# Patient Record
Sex: Female | Born: 1950 | Race: White | Hispanic: No | Marital: Single | Smoking: Former smoker
Health system: Southern US, Community
[De-identification: ages and names within clinical notes are randomized; demographics above are authoritative.]

## PROBLEM LIST (undated history)

## (undated) DIAGNOSIS — E039 Hypothyroidism, unspecified: Secondary | ICD-10-CM

## (undated) DIAGNOSIS — F418 Other specified anxiety disorders: Secondary | ICD-10-CM

## (undated) DIAGNOSIS — E7800 Pure hypercholesterolemia, unspecified: Secondary | ICD-10-CM

## (undated) DIAGNOSIS — M25512 Pain in left shoulder: Secondary | ICD-10-CM

## (undated) DIAGNOSIS — M1712 Unilateral primary osteoarthritis, left knee: Secondary | ICD-10-CM

## (undated) DIAGNOSIS — K219 Gastro-esophageal reflux disease without esophagitis: Secondary | ICD-10-CM

## (undated) DIAGNOSIS — K52832 Lymphocytic colitis: Secondary | ICD-10-CM

## (undated) DIAGNOSIS — F419 Anxiety disorder, unspecified: Secondary | ICD-10-CM

## (undated) DIAGNOSIS — F329 Major depressive disorder, single episode, unspecified: Secondary | ICD-10-CM

## (undated) DIAGNOSIS — G4700 Insomnia, unspecified: Secondary | ICD-10-CM

## (undated) DIAGNOSIS — I10 Essential (primary) hypertension: Secondary | ICD-10-CM

## (undated) HISTORY — PX: JOINT REPLACEMENT: SHX530

## (undated) HISTORY — DX: Unilateral primary osteoarthritis, left knee: M17.12

## (undated) HISTORY — PX: COLONOSCOPY: SHX174

## (undated) HISTORY — DX: Other specified anxiety disorders: F41.8

## (undated) HISTORY — DX: Insomnia, unspecified: G47.00

## (undated) HISTORY — PX: EYE SURGERY: SHX253

## (undated) HISTORY — DX: Pure hypercholesterolemia, unspecified: E78.00

## (undated) HISTORY — PX: BACK SURGERY: SHX140

## (undated) HISTORY — DX: Lymphocytic colitis: K52.832

## (undated) HISTORY — DX: Hypothyroidism, unspecified: E03.9

---

## 1992-03-21 HISTORY — PX: OTHER SURGICAL HISTORY: SHX169

## 1995-03-22 HISTORY — PX: OTHER SURGICAL HISTORY: SHX169

## 1997-09-16 ENCOUNTER — Encounter: Admission: RE | Admit: 1997-09-16 | Discharge: 1997-12-15 | Payer: Self-pay | Admitting: Family Medicine

## 1998-07-21 ENCOUNTER — Other Ambulatory Visit: Admission: RE | Admit: 1998-07-21 | Discharge: 1998-07-21 | Payer: Self-pay | Admitting: Family Medicine

## 1999-10-06 ENCOUNTER — Other Ambulatory Visit: Admission: RE | Admit: 1999-10-06 | Discharge: 1999-10-06 | Payer: Self-pay | Admitting: Family Medicine

## 2000-09-05 ENCOUNTER — Encounter: Payer: Self-pay | Admitting: Family Medicine

## 2000-09-05 ENCOUNTER — Ambulatory Visit (HOSPITAL_COMMUNITY): Admission: RE | Admit: 2000-09-05 | Discharge: 2000-09-05 | Payer: Self-pay | Admitting: Family Medicine

## 2000-09-11 ENCOUNTER — Encounter: Payer: Self-pay | Admitting: Neurosurgery

## 2000-09-11 ENCOUNTER — Ambulatory Visit (HOSPITAL_COMMUNITY): Admission: RE | Admit: 2000-09-11 | Discharge: 2000-09-12 | Payer: Self-pay | Admitting: Neurosurgery

## 2000-10-04 ENCOUNTER — Encounter: Admission: RE | Admit: 2000-10-04 | Discharge: 2000-10-04 | Payer: Self-pay | Admitting: Neurosurgery

## 2000-10-04 ENCOUNTER — Encounter: Payer: Self-pay | Admitting: Neurosurgery

## 2001-11-15 ENCOUNTER — Other Ambulatory Visit: Admission: RE | Admit: 2001-11-15 | Discharge: 2001-11-15 | Payer: Self-pay | Admitting: Family Medicine

## 2003-01-09 ENCOUNTER — Other Ambulatory Visit: Admission: RE | Admit: 2003-01-09 | Discharge: 2003-01-09 | Payer: Self-pay | Admitting: Unknown Physician Specialty

## 2005-08-29 ENCOUNTER — Ambulatory Visit (HOSPITAL_COMMUNITY): Admission: RE | Admit: 2005-08-29 | Discharge: 2005-08-29 | Payer: Self-pay | Admitting: Family Medicine

## 2005-09-15 ENCOUNTER — Encounter: Admission: RE | Admit: 2005-09-15 | Discharge: 2005-09-15 | Payer: Self-pay | Admitting: Family Medicine

## 2005-09-27 ENCOUNTER — Other Ambulatory Visit: Admission: RE | Admit: 2005-09-27 | Discharge: 2005-09-27 | Payer: Self-pay | Admitting: Family Medicine

## 2005-12-23 ENCOUNTER — Emergency Department (HOSPITAL_COMMUNITY): Admission: EM | Admit: 2005-12-23 | Discharge: 2005-12-23 | Payer: Self-pay | Admitting: Emergency Medicine

## 2005-12-26 ENCOUNTER — Emergency Department (HOSPITAL_COMMUNITY): Admission: EM | Admit: 2005-12-26 | Discharge: 2005-12-26 | Payer: Self-pay | Admitting: Emergency Medicine

## 2006-06-26 ENCOUNTER — Encounter: Admission: RE | Admit: 2006-06-26 | Discharge: 2006-09-24 | Payer: Self-pay | Admitting: Family Medicine

## 2006-10-09 ENCOUNTER — Encounter: Admission: RE | Admit: 2006-10-09 | Discharge: 2006-10-26 | Payer: Self-pay | Admitting: Family Medicine

## 2007-02-27 ENCOUNTER — Encounter: Admission: RE | Admit: 2007-02-27 | Discharge: 2007-02-27 | Payer: Self-pay | Admitting: Family Medicine

## 2010-04-11 ENCOUNTER — Encounter: Payer: Self-pay | Admitting: Family Medicine

## 2010-08-06 NOTE — Op Note (Signed)
[place]. [facility]  Patient:    Theresa Reese, Theresa Reese                         MRN: [id number] Proc. Date: 09/11/00 Adm. Date:  [id number] Attending:  Barton Fanny                           Operative Report  PREOPERATIVE DIAGNOSIS:  Recurrent left L4-5 lumbar disk herniation, degenerative disk disease, spondylosis, and radiculopathy.  POSTOPERATIVE DIAGNOSIS:  Recurrent left L4-5 lumbar disk herniation, degenerative disk disease, spondylosis, and radiculopathy.  PROCEDURE:  Left L4-5 lumbar laminotomy and microdiskectomy with microdissection.  SURGEON:  Hewitt Shorts, M.D.  ANESTHESIA:  General endotracheal.  INDICATIONS:  The patient is a 60 year old woman who presented with acute left lumbar radiculopathy and was found by MRI scan to have advanced degenerative disk disease and spondylosis with superimposed recurrent disk herniation.  The decision was made to proceed with elective laminotomy and microdiskectomy.  DESCRIPTION OF PROCEDURE:  The patient was brought to the operating room and placed under general endotracheal anesthesia.  The patient was turned into a prone position.  The lumbar region was prepped with Betadine soap and solution, and draped in a sterile fashion.  The midline was infiltrated with local anesthetic with epinephrine.  The previous midline incision was reopened, and dissection was carried down to the subcutaneous tissue to the level of the lumbar fascia.  This was incised on the left side of the midline, and the paraspinal muscles were dissected from the spinous process and lamina in a subperiosteal fashion.  The previous laminotomy defect was identified and an x-ray was taken from the localization, and then we carefully dissected the scar from within the laminotomy defect.  The laminotomy was extended using the [facility] Max drill and Kerrison punches.  The microscope was draped and brought into the field to provide  additional magnification, illumination, and visualization, and the remainder of the procedure was performed using microdissection and microsurgical technique.  We defined the edges of the laminotomy and defined the thecal sac.  There was extensive scarring within the laminotomy as well as in the epidural space. However, we were able to carefully dissect and expose the thecal sac and the left L4 and L5 nerve roots, and we were able to identify the disk space. Dissecting along the dorsal aspect of the vertebral body, ventral to the thecal sac, we were able to create a plane and several fragments of disk herniation extruded themselves and were removed.  We then incised the annulus and scar, and entered into the disk space, removing degenerative disk material.  We were then able to further mobilize the epidural scar and disk herniation which was intertwined ventral lateral to the thecal sac.  As this was mobilized, we were able to decompress the thecal sac and left L5 nerve roots.  A thorough diskectomy was performed with removal of all loose fragments of disk material from both within the disk space and the epidural space with good decompression of the neural structures.  Hemostasis was established using bipolar cautery and then once the diskectomy was completed and the neural structures decompressed and hemostasis was established, we instilled 2 cc of fentanyl and 80 mg of Depo-Medrol into the epidural space, and then proceeded with closure.  The deep fascia was closed with interrupted undyed 0 Vicryl sutures.  The subcutaneous and subcuticular layer were closed  with interrupted and inverted 2-0 undyed Vicryl suture, and the skin edges were approximated with Dermabond.  The patient tolerated the procedure well.  The estimated blood loss was less than 50 cc.  The sponge and needle count were correct.  Following surgery, the patient was returned back to the supine position, reversed from  the anesthetic, extubated, and transferred to the recovery room for further care. DD:  09/11/00 TD:  09/12/00 Job: [id number]

## 2010-08-06 NOTE — H&P (Signed)
[place]. [facility]  Patient:    Theresa Reese, Theresa Reese                         MRN: [id number] Adm. Date:  [id number] Attending:  Barton Fanny                         History and Physical  HISTORY OF PRESENT ILLNESS:  The patient is a 60 year old, right-handed white female who was evaluated for an acute left lumbar radiculopathy.  She had previous lumbar surgery for a left L4-5 lumbar disk excision four years ago. She has had some ongoing difficulties with left sciatica on an intermittent basis, and has been undergoing chiropractic care.  An MRI two years ago showed disk herniation, according to the patient, but she was doing well up until about 3-1/2 weeks ago, when she was moving a flower pot in her yard.  She stumbled and felt a pop in her low back.  She subsequently developed pain radiating through the left lower extremity which has been severe and excruciating.  She has been taking Percocet which barely relieves any of the pain.  She has developed constipation from the Percocet.  At this point she complains of pain in the left buttock, posterior thigh, lateral left leg, into the dorsum of the left foot, towards her left great toe.  She described numbness and tingling in the lateral left leg, and occasionally over the dorsum of the left foot.  She does describe residual numbness and tingling in the left great toe from her previous ruptured disk four years ago.  She is unsure of any specific weakness, but finds it difficult to bear weight with her left lower extremity because of the severity of her pain.  PAST MEDICAL HISTORY: 1. Notable for a history of hypertension. 2. Hypercholesterolemia. 3. Hypothyroidism.  All of these are treated.  She does not describe any history of a myocardial infarction, cancer, stroke, diabetes mellitus, peptic ulcer disease, or lung disease.  PAST SURGICAL HISTORY: 1. Lumbar surgery four years ago. 2. Two cesarean  sections.  ALLERGIES:  ERYTHROMYCIN, KEFLEX, STREPTOMYCIN, AND PENICILLIN.  CURRENT MEDICATIONS: 1. Ortho-Novum, hormone for menopause. 2. Levoxyl q.d. for thyroid. 3. Hydrochlorothiazide q.d. for hypertension. 4. Lipitor q.d. for hypercholesterolemia.  FAMILY HISTORY:  Her mother died at age 40 from a heart attack.  Her father died at age 90 from a heart attack.  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 She is married.  She does not smoke, drink alcoholic beverages, or have a history of substance abuse.  REVIEW OF SYSTEMS:  Notable for that as described in her history of present illness and past medical history, and otherwise unremarkable.  PHYSICAL EXAMINATION:  GENERAL:  The patient is a well-developed, well-nourished white female, in obvious discomfort, who is evaluated in her wheelchair, due to the severity of her pain.  VITAL SIGNS:  Temperature 97.5 degrees, pulse 84, blood pressure 152/89, respirations 20, height 5 feet 6 inches, weight 147 pounds.  LUNGS:  Clear to auscultation.  She has symmetric respiratory excursion.  HEART:  A regular rate and rhythm.  Normal S1, S2.  No murmur.  ABDOMEN:  Soft, nondistended.  Bowel sounds are present.  EXTREMITIES:  No cyanosis, clubbing, or edema.  MUSCULOSKELETAL:  No particular tenderness to palpation over the lumbar spinous process or paralumbar musculature.  It is impossible to test straight  leg raising reliably.  NEUROLOGIC:  Shows 5/5 strength in the dorsiflexor, plantar flexor, extensor hallucis longus bilaterally.  Sensation decreased to pinprick in the lateral left leg and medial left foot, otherwise intact to the distal lower extremities.  Reflexes 2-3 at the quadriceps, 2 at the gastrocnemius, symmetrical bilaterally.  Toes downgoing bilaterally.  Gait and stance were not testable.  DIAGNOSTIC STUDIES:  An MRI of the lumbar spine from [facility] was limited, due to  claustrophobia but showed advanced degenerative disk disease and spondylosis at L4-5 and L5-S1, with desiccation of the disk, modic changes, circumferential disk bulging, but the most significant findings of recurrent left L4-5 lumbar disk herniation.  IMPRESSION:  Acute left lumbar radiculopathy, secondary to left L4-5 recurrent lumbar disk herniation with resulting incapacitating pain.  Motor, reflexes, and function are intact; however, she does have a sensory deficit corresponding to the left L5 nerve root.  PLAN:  The patient will be admitted for a left L4-5 lumbar laminotomy and microdiskectomy.  We have discussed the alternatives to surgery, the nature of the surgical procedure itself, including the typical length of surgery, hospital stay, and overall recuperation, and her limitations during the postoperative period, and the risks of surgery including the risks of infection, bleeding, possible need for transfusion, the risks of nerve root dysfunction with pain, weakness, numbness, or paresthesias, the risks of dural tear and CSF leakage, and possible need for further surgery, the risks of recurrent disk herniations, possible need for further surgery, and the anesthetic risks of a myocardial infarction, stroke, pneumonia, and death, all of which are increased due to her history of hypercholesterolemia, hypertension, and thyroid disease. Understanding all of this though, she does wish to proceed with surgery, and is admitted for such. DD:  09/11/00 TD:  09/11/00 Job: [id number]

## 2011-03-22 HISTORY — PX: RETINAL DETACHMENT SURGERY: SHX105

## 2011-10-07 ENCOUNTER — Encounter (INDEPENDENT_AMBULATORY_CARE_PROVIDER_SITE_OTHER): Payer: BC Managed Care – PPO | Admitting: Ophthalmology

## 2011-10-07 DIAGNOSIS — H33309 Unspecified retinal break, unspecified eye: Secondary | ICD-10-CM

## 2011-10-07 DIAGNOSIS — H2510 Age-related nuclear cataract, unspecified eye: Secondary | ICD-10-CM

## 2011-10-07 DIAGNOSIS — H43819 Vitreous degeneration, unspecified eye: Secondary | ICD-10-CM

## 2011-10-25 ENCOUNTER — Ambulatory Visit (INDEPENDENT_AMBULATORY_CARE_PROVIDER_SITE_OTHER): Payer: BC Managed Care – PPO | Admitting: Ophthalmology

## 2011-10-25 DIAGNOSIS — H33309 Unspecified retinal break, unspecified eye: Secondary | ICD-10-CM

## 2012-03-07 ENCOUNTER — Ambulatory Visit (INDEPENDENT_AMBULATORY_CARE_PROVIDER_SITE_OTHER): Payer: BC Managed Care – PPO | Admitting: Ophthalmology

## 2012-03-07 DIAGNOSIS — H33309 Unspecified retinal break, unspecified eye: Secondary | ICD-10-CM

## 2012-03-07 DIAGNOSIS — H43819 Vitreous degeneration, unspecified eye: Secondary | ICD-10-CM

## 2012-03-07 DIAGNOSIS — H2510 Age-related nuclear cataract, unspecified eye: Secondary | ICD-10-CM

## 2012-03-21 HISTORY — PX: KNEE ARTHROSCOPY W/ MENISCECTOMY: SHX1879

## 2013-06-18 ENCOUNTER — Ambulatory Visit: Payer: BC Managed Care – PPO | Attending: Orthopedic Surgery | Admitting: Physical Therapy

## 2013-06-18 DIAGNOSIS — M25569 Pain in unspecified knee: Secondary | ICD-10-CM | POA: Insufficient documentation

## 2013-06-18 DIAGNOSIS — M25669 Stiffness of unspecified knee, not elsewhere classified: Secondary | ICD-10-CM | POA: Insufficient documentation

## 2013-06-18 DIAGNOSIS — IMO0001 Reserved for inherently not codable concepts without codable children: Secondary | ICD-10-CM | POA: Insufficient documentation

## 2013-06-18 DIAGNOSIS — R269 Unspecified abnormalities of gait and mobility: Secondary | ICD-10-CM | POA: Insufficient documentation

## 2013-06-20 ENCOUNTER — Ambulatory Visit: Payer: BC Managed Care – PPO | Attending: Orthopedic Surgery | Admitting: Physical Therapy

## 2013-06-20 DIAGNOSIS — M25669 Stiffness of unspecified knee, not elsewhere classified: Secondary | ICD-10-CM | POA: Insufficient documentation

## 2013-06-20 DIAGNOSIS — IMO0001 Reserved for inherently not codable concepts without codable children: Secondary | ICD-10-CM | POA: Insufficient documentation

## 2013-06-20 DIAGNOSIS — R269 Unspecified abnormalities of gait and mobility: Secondary | ICD-10-CM | POA: Insufficient documentation

## 2013-06-20 DIAGNOSIS — M25569 Pain in unspecified knee: Secondary | ICD-10-CM | POA: Insufficient documentation

## 2013-06-24 ENCOUNTER — Ambulatory Visit: Payer: BC Managed Care – PPO | Admitting: Physical Therapy

## 2013-06-25 ENCOUNTER — Ambulatory Visit: Payer: BC Managed Care – PPO | Admitting: Physical Therapy

## 2013-06-27 ENCOUNTER — Ambulatory Visit: Payer: BC Managed Care – PPO | Admitting: Physical Therapy

## 2013-07-02 ENCOUNTER — Ambulatory Visit: Payer: BC Managed Care – PPO | Admitting: Physical Therapy

## 2013-07-04 ENCOUNTER — Ambulatory Visit: Payer: BC Managed Care – PPO | Admitting: Physical Therapy

## 2013-07-08 ENCOUNTER — Encounter: Payer: BC Managed Care – PPO | Admitting: Physical Therapy

## 2013-07-11 ENCOUNTER — Ambulatory Visit: Payer: BC Managed Care – PPO | Admitting: Physical Therapy

## 2013-07-15 ENCOUNTER — Ambulatory Visit: Payer: BC Managed Care – PPO | Admitting: Physical Therapy

## 2013-07-18 ENCOUNTER — Encounter: Payer: BC Managed Care – PPO | Admitting: *Deleted

## 2013-07-22 ENCOUNTER — Ambulatory Visit: Payer: BC Managed Care – PPO | Attending: Orthopedic Surgery | Admitting: Physical Therapy

## 2013-07-22 DIAGNOSIS — M25669 Stiffness of unspecified knee, not elsewhere classified: Secondary | ICD-10-CM | POA: Insufficient documentation

## 2013-07-22 DIAGNOSIS — M25569 Pain in unspecified knee: Secondary | ICD-10-CM | POA: Insufficient documentation

## 2013-07-22 DIAGNOSIS — R269 Unspecified abnormalities of gait and mobility: Secondary | ICD-10-CM | POA: Insufficient documentation

## 2013-07-22 DIAGNOSIS — IMO0001 Reserved for inherently not codable concepts without codable children: Secondary | ICD-10-CM | POA: Insufficient documentation

## 2013-07-25 ENCOUNTER — Encounter: Payer: BC Managed Care – PPO | Admitting: Physical Therapy

## 2013-07-30 ENCOUNTER — Encounter: Payer: BC Managed Care – PPO | Admitting: *Deleted

## 2013-08-06 ENCOUNTER — Ambulatory Visit: Payer: BC Managed Care – PPO | Admitting: Physical Therapy

## 2013-08-08 ENCOUNTER — Encounter: Payer: BC Managed Care – PPO | Admitting: Physical Therapy

## 2013-08-28 ENCOUNTER — Encounter: Payer: Self-pay | Admitting: Physician Assistant

## 2013-08-28 ENCOUNTER — Other Ambulatory Visit: Payer: Self-pay | Admitting: Physician Assistant

## 2013-08-28 DIAGNOSIS — K52832 Lymphocytic colitis: Secondary | ICD-10-CM

## 2013-08-28 DIAGNOSIS — M1712 Unilateral primary osteoarthritis, left knee: Secondary | ICD-10-CM | POA: Insufficient documentation

## 2013-08-28 DIAGNOSIS — E039 Hypothyroidism, unspecified: Secondary | ICD-10-CM | POA: Insufficient documentation

## 2013-08-28 NOTE — H&P (Signed)
TOTAL KNEE ADMISSION H&P  Patient is being admitted for left uni vs total knee arthroplasty.  Subjective:  Chief Complaint:left knee pain.  HPI: Theresa Reese, 63 y.o. female, has a history of pain and functional disability in the left knee due to arthritis and has failed non-surgical conservative treatments for greater than 12 weeks to includeNSAID's and/or analgesics, corticosteriod injections, viscosupplementation injections, flexibility and strengthening excercises, supervised PT with diminished ADL's post treatment, use of assistive devices and activity modification.  Onset of symptoms was gradual, starting 7 years ago with gradually worsening course since that time. The patient noted prior procedures on the knee to include  arthroscopy and menisectomy on the left knee(s).  Patient currently rates pain in the left knee(s) at 10 out of 10 with activity. Patient has night pain, worsening of pain with activity and weight bearing, pain that interferes with activities of daily living, crepitus and joint swelling.  Patient has evidence of subchondral sclerosis, periarticular osteophytes and joint space narrowing by imaging studies.There is no active infection.  Patient Active Problem List   Diagnosis Date Noted  . Hypothyroid   . Lymphocytic colitis   . Left knee DJD    Past Medical History  Diagnosis Date  . Hypothyroid   . Insomnia   . Hypercholesteremia   . Depression with anxiety   . Lymphocytic colitis   . Left knee DJD     Past Surgical History  Procedure Laterality Date  . Cesarean section  1987  . Cesarean section  1990  . Low back surgery  1997    Dr Jule SerNudleman  . Low back surgery  1994    Dr Elesa Hackereaton  . Knee arthroscopy w/ meniscectomy Left 2014    Dr Shelle IronBeane     (Not in a hospital admission) Allergies  Allergen Reactions  . Erythromycin     Diarrhea   Hospitalized for dehydration   . Keflex [Cephalexin]     Yeast infection   . Penicillins Rash    Outpatient  Encounter Prescriptions as of 08/28/2013  Medication Sig  . atorvastatin (LIPITOR) 20 MG tablet Take 20 mg by mouth daily.  Marland Kitchen. buPROPion (WELLBUTRIN XL) 150 MG 24 hr tablet Take 150 mg by mouth daily.  Marland Kitchen. levothyroxine (SYNTHROID, LEVOTHROID) 112 MCG tablet Take 112 mcg by mouth daily before breakfast.  . zolpidem (AMBIEN) 10 MG tablet Take 10 mg by mouth at bedtime as needed for sleep.   History  Substance Use Topics  . Smoking status: Former Smoker    Quit date: 08/29/1970  . Smokeless tobacco: Not on file  . Alcohol Use: Yes     Comment: rarely    Family History  Problem Relation Age of Onset  . Heart disease Mother   . Heart attack Mother   . Hypertension Mother   . Heart attack Father   . Hypertension Father   . Stroke Father      Review of Systems  Constitutional: Negative.   HENT: Negative.   Eyes: Negative.   Respiratory: Negative.   Cardiovascular: Negative.   Gastrointestinal: Negative.   Genitourinary: Negative.   Musculoskeletal: Positive for joint pain.  Skin: Negative.   Neurological: Negative.   Endo/Heme/Allergies: Negative.     Objective:  Physical Exam  Constitutional: She is oriented to person, place, and time. She appears well-developed and well-nourished.  HENT:  Head: Normocephalic and atraumatic.  Mouth/Throat: Oropharynx is clear and moist.  Eyes: Conjunctivae and EOM are normal. Pupils are equal, round, and reactive  to light.  Neck: Neck supple.  Cardiovascular: Normal rate, regular rhythm and intact distal pulses.  Exam reveals no gallop and no friction rub.   No murmur heard. Respiratory: Breath sounds normal. No respiratory distress. She has no wheezes. She has no rales. She exhibits no tenderness.  GI: Bowel sounds are normal. She exhibits no distension and no mass. There is no tenderness. There is no rebound and no guarding.  Genitourinary:  Not pertinent to current symptomatology therefore not examined.  Musculoskeletal:  Examination  of her left knee reveals pain on the medial joint line positive medial McMurray's 1+effusion range of motion -5 to 125 degrees, knee is stable with normal patella tracking. Exam of the right knee reveals full range of motion without pain swelling weakness or instability. Vascular exam: pulses 2+ and symmetric.  Neurological: She is alert and oriented to person, place, and time.  Skin: Skin is warm and dry.  Psychiatric: She has a normal mood and affect. Her behavior is normal.    Vital signs in last 24 hours: @ Last recorded: 06/10 1500   BP: 142/84 Pulse: 76  Temp: 98.2 F (36.8 C)    Height: 5\' 6"  (1.676 m)    Weight: 68.04 kg (150 lb)     Labs:   Estimated body mass index is 24.22 kg/(m^2) as calculated from the following:   Height as of this encounter: 5\' 6"  (1.676 m).   Weight as of this encounter: 68.04 kg (150 lb).   Imaging Review Plain radiographs demonstrate severe degenerative joint disease of the left knee(s). The overall alignment ismild varus. The bone quality appears to be good for age and reported activity level.  Assessment/Plan:  End stage arthritis, left knee  Patient Active Problem List   Diagnosis Date Noted  . Hypothyroid   . Lymphocytic colitis   . Left knee DJD     The patient history, physical examination, clinical judgment of the provider and imaging studies are consistent with end stage degenerative joint disease of the left knee(s) and total knee arthroplasty is deemed medically necessary. The treatment options including medical management, injection therapy arthroscopy and arthroplasty were discussed at length. The risks and benefits of total knee arthroplasty were presented and reviewed. The risks due to aseptic loosening, infection, stiffness, patella tracking problems, thromboembolic complications and other imponderables were discussed. The patient acknowledged the explanation, agreed to proceed with the plan and consent was signed. Patient is  being admitted for inpatient treatment for surgery, pain control, PT, OT, prophylactic antibiotics, VTE prophylaxis, progressive ambulation and ADL's and discharge planning. The patient is planning to be discharged home with home health services

## 2013-08-29 ENCOUNTER — Encounter (HOSPITAL_COMMUNITY): Payer: Self-pay | Admitting: Pharmacy Technician

## 2013-08-30 ENCOUNTER — Encounter (HOSPITAL_COMMUNITY)
Admission: RE | Admit: 2013-08-30 | Discharge: 2013-08-30 | Disposition: A | Discharge status: Home / Self Care | Payer: BC Managed Care – PPO | Source: Ambulatory Visit | Attending: Physician Assistant | Admitting: Physician Assistant

## 2013-08-30 ENCOUNTER — Encounter (HOSPITAL_COMMUNITY)
Admission: RE | Admit: 2013-08-30 | Discharge: 2013-08-30 | Disposition: A | Discharge status: Home / Self Care | Payer: BC Managed Care – PPO | Source: Ambulatory Visit | Attending: Orthopedic Surgery | Admitting: Orthopedic Surgery

## 2013-08-30 ENCOUNTER — Encounter (HOSPITAL_COMMUNITY): Payer: Self-pay

## 2013-08-30 DIAGNOSIS — Z01818 Encounter for other preprocedural examination: Secondary | ICD-10-CM | POA: Diagnosis not present

## 2013-08-30 DIAGNOSIS — Z01812 Encounter for preprocedural laboratory examination: Secondary | ICD-10-CM | POA: Insufficient documentation

## 2013-08-30 HISTORY — DX: Anxiety disorder, unspecified: F41.9

## 2013-08-30 HISTORY — DX: Major depressive disorder, single episode, unspecified: F32.9

## 2013-08-30 LAB — COMPREHENSIVE METABOLIC PANEL
ALT: 18 U/L (ref 0–35)
AST: 22 U/L (ref 0–37)
Albumin: 4.2 g/dL (ref 3.5–5.2)
Alkaline Phosphatase: 82 U/L (ref 39–117)
BILIRUBIN TOTAL: 0.5 mg/dL (ref 0.3–1.2)
BUN: 11 mg/dL (ref 6–23)
CHLORIDE: 102 meq/L (ref 96–112)
CO2: 28 meq/L (ref 19–32)
Calcium: 10.2 mg/dL (ref 8.4–10.5)
Creatinine, Ser: 0.94 mg/dL (ref 0.50–1.10)
GFR calc non Af Amer: 64 mL/min — ABNORMAL LOW (ref >90)
GFR, EST AFRICAN AMERICAN: 74 mL/min — AB (ref >90)
GLUCOSE: 110 mg/dL — AB (ref 70–99)
POTASSIUM: 4.4 meq/L (ref 3.7–5.3)
Sodium: 144 mEq/L (ref 137–147)
Total Protein: 7.3 g/dL (ref 6.0–8.3)

## 2013-08-30 LAB — CBC WITH DIFFERENTIAL/PLATELET
BASOS ABS: 0 10*3/uL (ref 0.0–0.1)
Basophils Relative: 0 % (ref 0–1)
EOS PCT: 4 % (ref 0–5)
Eosinophils Absolute: 0.2 10*3/uL (ref 0.0–0.7)
HCT: 39.8 % (ref 36.0–46.0)
Hemoglobin: 13.7 g/dL (ref 12.0–15.0)
LYMPHS ABS: 1.9 10*3/uL (ref 0.7–4.0)
LYMPHS PCT: 28 % (ref 12–46)
MCH: 31.7 pg (ref 26.0–34.0)
MCHC: 34.4 g/dL (ref 30.0–36.0)
MCV: 92.1 fL (ref 78.0–100.0)
Monocytes Absolute: 0.5 10*3/uL (ref 0.1–1.0)
Monocytes Relative: 7 % (ref 3–12)
Neutro Abs: 4.2 10*3/uL (ref 1.7–7.7)
Neutrophils Relative %: 61 % (ref 43–77)
Platelets: 281 10*3/uL (ref 150–400)
RBC: 4.32 MIL/uL (ref 3.87–5.11)
RDW: 12.1 % (ref 11.5–15.5)
WBC: 6.9 10*3/uL (ref 4.0–10.5)

## 2013-08-30 LAB — TYPE AND SCREEN
ABO/RH(D): A POS
Antibody Screen: NEGATIVE

## 2013-08-30 LAB — SURGICAL PCR SCREEN
MRSA, PCR: NEGATIVE
STAPHYLOCOCCUS AUREUS: NEGATIVE

## 2013-08-30 LAB — URINALYSIS, ROUTINE W REFLEX MICROSCOPIC
Bilirubin Urine: NEGATIVE
Glucose, UA: NEGATIVE mg/dL
Hgb urine dipstick: NEGATIVE
Ketones, ur: NEGATIVE mg/dL
LEUKOCYTES UA: NEGATIVE
Nitrite: NEGATIVE
PROTEIN: NEGATIVE mg/dL
Specific Gravity, Urine: 1.015 (ref 1.005–1.030)
Urobilinogen, UA: 0.2 mg/dL (ref 0.0–1.0)
pH: 5.5 (ref 5.0–8.0)

## 2013-08-30 LAB — APTT: APTT: 26 s (ref 24–37)

## 2013-08-30 LAB — ABO/RH: ABO/RH(D): A POS

## 2013-08-30 LAB — PROTIME-INR
INR: 1.01 (ref 0.00–1.49)
Prothrombin Time: 13.1 seconds (ref 11.6–15.2)

## 2013-08-30 NOTE — Progress Notes (Signed)
PCP is Dr. Vicente [facility]reneWendy Mc [facility]. Denies seeing a cardiologist. Denies having a recent CXR, but states that she had an EKG a few weeks ago.  Request sent to Dr Darrell JewelMcNeill's office for EKG. Denies ever having a stress test, echo, or card cath.

## 2013-08-30 NOTE — Pre-Procedure Instructions (Signed)
Theresa SagesKathy P Reese  08/30/2013   Your procedure is scheduled on:  Mon, June 22 @ 11:00 AM  [facility]  at 9:00 AM.  Call this number if you have problems the morning of surgery: [phone]   Remember:   Do not eat food or drink liquids after midnight.   Take these medicines the morning of surgery with A SIP OF WATER: Wellbutrin(Bupropion) and Synthroid(Levothyroxine)              No Goody's,BC's,Aleve,Aspirin,Ibuprofen,Fish Oil,or any Herbal Medications   Do not wear jewelry, make-up or nail polish.  Do not wear lotions, powders, or perfumes. You may wear deodorant.  Do not shave 48 hours prior to surgery.   Do not bring valuables to the hospital.  [facility] is not responsible                  for any belongings or valuables.               Contacts, dentures or bridgework may not be worn into surgery.  Leave suitcase in the car. After surgery it may be brought to your room.  For patients admitted to the hospital, discharge time is determined by your                treatment team.               Special Instructions:  [place] - Preparing for Surgery  Before surgery, you can play an important role.  Because skin is not sterile, your skin needs to be as free of germs as possible.  You can reduce the number of germs on you skin by washing with CHG (chlorahexidine gluconate) soap before surgery.  CHG is an antiseptic cleaner which kills germs and bonds with the skin to continue killing germs even after washing.  Please DO NOT use if you have an allergy to CHG or antibacterial soaps.  If your skin becomes reddened/irritated stop using the CHG and inform your nurse when you arrive at Short Stay.  Do not shave (including legs and underarms) for at least 48 hours prior to the first CHG shower.  You may shave your face.  Please follow these instructions carefully:   1.  Shower with CHG Soap the night before surgery and the                                morning of  Surgery.  2.  If you choose to wash your hair, wash your hair first as usual with your       normal shampoo.  3.  After you shampoo, rinse your hair and body thoroughly to remove the                      Shampoo.  4.  Use CHG as you would any other liquid soap.  You can apply chg directly       to the skin and wash gently with scrungie or a clean washcloth.  5.  Apply the CHG Soap to your body ONLY FROM THE NECK DOWN.        Do not use on open wounds or open sores.  Avoid contact with your eyes,       ears, mouth and genitals (private parts).  Wash genitals (private parts)       with your normal soap.  6.  Wash thoroughly, paying special attention to the area where your surgery        will be performed.  7.  Thoroughly rinse your body with warm water from the neck down.  8.  DO NOT shower/wash with your normal soap after using and rinsing off       the CHG Soap.  9.  Pat yourself dry with a clean towel.            10.  Wear clean pajamas.            11.  Place clean sheets on your bed the night of your first shower and do not        sleep with pets.  Day of Surgery  Do not apply any lotions/deoderants the morning of surgery.  Please wear clean clothes to the hospital/surgery center.     Please read over the following fact sheets that you were given: Pain Booklet, Coughing and Deep Breathing, Blood Transfusion Information, MRSA Information and Surgical Site Infection Prevention

## 2013-09-06 NOTE — Progress Notes (Signed)
Pt made aware of new arrival time of 5:30 AM on 09/09/13

## 2013-09-08 MED ORDER — VANCOMYCIN HCL IN DEXTROSE 1-5 GM/200ML-% IV SOLN
1000.0000 mg | INTRAVENOUS | Status: AC
  Administered 2013-09-09: 1000 mg via INTRAVENOUS
  Filled 2013-09-08: qty 200

## 2013-09-09 ENCOUNTER — Inpatient Hospital Stay (HOSPITAL_COMMUNITY): Payer: BC Managed Care – PPO | Admitting: Vascular Surgery

## 2013-09-09 ENCOUNTER — Inpatient Hospital Stay (HOSPITAL_COMMUNITY)
Admission: RE | Admit: 2013-09-09 | Discharge: 2013-09-10 | DRG: 470 | Disposition: A | Discharge status: Home Health | Payer: BC Managed Care – PPO | Source: Ambulatory Visit | Attending: Orthopedic Surgery | Admitting: Orthopedic Surgery

## 2013-09-09 ENCOUNTER — Encounter (HOSPITAL_COMMUNITY)
Admission: RE | Disposition: A | Discharge status: Home Health | Payer: Self-pay | Source: Ambulatory Visit | Attending: Orthopedic Surgery

## 2013-09-09 ENCOUNTER — Encounter (HOSPITAL_COMMUNITY): Payer: BC Managed Care – PPO | Admitting: Vascular Surgery

## 2013-09-09 DIAGNOSIS — F418 Other specified anxiety disorders: Secondary | ICD-10-CM | POA: Diagnosis present

## 2013-09-09 DIAGNOSIS — M1710 Unilateral primary osteoarthritis, unspecified knee: Principal | ICD-10-CM | POA: Diagnosis present

## 2013-09-09 DIAGNOSIS — K52832 Lymphocytic colitis: Secondary | ICD-10-CM | POA: Diagnosis present

## 2013-09-09 DIAGNOSIS — Z87891 Personal history of nicotine dependence: Secondary | ICD-10-CM

## 2013-09-09 DIAGNOSIS — K5289 Other specified noninfective gastroenteritis and colitis: Secondary | ICD-10-CM | POA: Diagnosis present

## 2013-09-09 DIAGNOSIS — Z880 Allergy status to penicillin: Secondary | ICD-10-CM

## 2013-09-09 DIAGNOSIS — E039 Hypothyroidism, unspecified: Secondary | ICD-10-CM | POA: Diagnosis present

## 2013-09-09 DIAGNOSIS — E7800 Pure hypercholesterolemia, unspecified: Secondary | ICD-10-CM | POA: Diagnosis present

## 2013-09-09 DIAGNOSIS — G4700 Insomnia, unspecified: Secondary | ICD-10-CM | POA: Diagnosis present

## 2013-09-09 DIAGNOSIS — M179 Osteoarthritis of knee, unspecified: Secondary | ICD-10-CM | POA: Diagnosis present

## 2013-09-09 DIAGNOSIS — M1712 Unilateral primary osteoarthritis, left knee: Secondary | ICD-10-CM

## 2013-09-09 DIAGNOSIS — F341 Dysthymic disorder: Secondary | ICD-10-CM | POA: Diagnosis present

## 2013-09-09 HISTORY — PX: TOTAL KNEE ARTHROPLASTY: SHX125

## 2013-09-09 SURGERY — ARTHROPLASTY, KNEE, TOTAL
Anesthesia: General | Site: Knee | Laterality: Left

## 2013-09-09 MED ORDER — DIPHENHYDRAMINE HCL 12.5 MG/5ML PO ELIX: 12.5000 mg | ORAL_SOLUTION | ORAL | Status: DC | PRN

## 2013-09-09 MED ORDER — ONDANSETRON HCL 4 MG PO TABS: 4.0000 mg | ORAL_TABLET | Freq: Four times a day (QID) | ORAL | Status: DC | PRN

## 2013-09-09 MED ORDER — NEOSTIGMINE METHYLSULFATE 10 MG/10ML IV SOLN
INTRAVENOUS | Status: DC | PRN
  Administered 2013-09-09: 3 mg via INTRAVENOUS

## 2013-09-09 MED ORDER — FENTANYL CITRATE 0.05 MG/ML IJ SOLN
INTRAMUSCULAR | Status: DC | PRN
Start: 2013-09-09 — End: 2013-09-09
  Administered 2013-09-09 (×5): 50 ug via INTRAVENOUS
  Administered 2013-09-09: 100 ug via INTRAVENOUS

## 2013-09-09 MED ORDER — DEXAMETHASONE SODIUM PHOSPHATE 10 MG/ML IJ SOLN
INTRAMUSCULAR | Status: DC | PRN
  Administered 2013-09-09: 10 mg via INTRAVENOUS

## 2013-09-09 MED ORDER — ACETAMINOPHEN 650 MG RE SUPP: 650.0000 mg | Freq: Four times a day (QID) | RECTAL | Status: DC | PRN

## 2013-09-09 MED ORDER — POTASSIUM CHLORIDE IN NACL 20-0.9 MEQ/L-% IV SOLN
INTRAVENOUS | Status: DC
  Administered 2013-09-09: 16:00:00 via INTRAVENOUS
  Filled 2013-09-09 (×5): qty 1000

## 2013-09-09 MED ORDER — VITAMIN C 500 MG PO TABS
500.0000 mg | ORAL_TABLET | Freq: Every day | ORAL | Status: DC
  Administered 2013-09-10: 500 mg via ORAL
  Filled 2013-09-09 (×2): qty 1

## 2013-09-09 MED ORDER — FENTANYL CITRATE 0.05 MG/ML IJ SOLN
INTRAMUSCULAR | Status: AC
  Filled 2013-09-09: qty 5

## 2013-09-09 MED ORDER — GLYCOPYRROLATE 0.2 MG/ML IJ SOLN
INTRAMUSCULAR | Status: DC | PRN
  Administered 2013-09-09: 0.4 mg via INTRAVENOUS

## 2013-09-09 MED ORDER — LIDOCAINE HCL (CARDIAC) 20 MG/ML IV SOLN
INTRAVENOUS | Status: DC | PRN
  Administered 2013-09-09: 70 mg via INTRAVENOUS

## 2013-09-09 MED ORDER — PROPOFOL 10 MG/ML IV BOLUS
INTRAVENOUS | Status: AC
  Filled 2013-09-09: qty 20

## 2013-09-09 MED ORDER — HYDROMORPHONE HCL PF 1 MG/ML IJ SOLN
1.0000 mg | INTRAMUSCULAR | Status: DC | PRN
  Administered 2013-09-09 (×3): 1 mg via INTRAVENOUS
  Filled 2013-09-09 (×3): qty 1

## 2013-09-09 MED ORDER — GLYCOPYRROLATE 0.2 MG/ML IJ SOLN
INTRAMUSCULAR | Status: AC
  Filled 2013-09-09: qty 1

## 2013-09-09 MED ORDER — CHLORHEXIDINE GLUCONATE 4 % EX LIQD
60.0000 mL | Freq: Once | CUTANEOUS | Status: DC
  Filled 2013-09-09: qty 60

## 2013-09-09 MED ORDER — MIDAZOLAM HCL 5 MG/5ML IJ SOLN
INTRAMUSCULAR | Status: DC | PRN
  Administered 2013-09-09: 2 mg via INTRAVENOUS

## 2013-09-09 MED ORDER — SODIUM CHLORIDE 0.9 % IJ SOLN
INTRAMUSCULAR | Status: AC
  Filled 2013-09-09: qty 10

## 2013-09-09 MED ORDER — CALCIUM-MAGNESIUM-VITAMIN D 500-250-200 MG-MG-UNIT PO TABS: 1.0000 | ORAL_TABLET | Freq: Every day | ORAL | Status: DC

## 2013-09-09 MED ORDER — PROPOFOL 10 MG/ML IV BOLUS
INTRAVENOUS | Status: DC | PRN
  Administered 2013-09-09: 160 mg via INTRAVENOUS

## 2013-09-09 MED ORDER — MIDAZOLAM HCL 2 MG/2ML IJ SOLN
INTRAMUSCULAR | Status: AC
  Filled 2013-09-09: qty 2

## 2013-09-09 MED ORDER — HYDROMORPHONE HCL PF 1 MG/ML IJ SOLN
0.2500 mg | INTRAMUSCULAR | Status: DC | PRN
  Administered 2013-09-09 (×4): 0.5 mg via INTRAVENOUS

## 2013-09-09 MED ORDER — VITAMIN D3 25 MCG (1000 UNIT) PO TABS
2000.0000 [IU] | ORAL_TABLET | Freq: Every day | ORAL | Status: DC
  Administered 2013-09-10: 2000 [IU] via ORAL
  Filled 2013-09-09 (×2): qty 2

## 2013-09-09 MED ORDER — ONDANSETRON HCL 4 MG/2ML IJ SOLN
INTRAMUSCULAR | Status: AC
  Filled 2013-09-09: qty 2

## 2013-09-09 MED ORDER — ADULT MULTIVITAMIN W/MINERALS CH
1.0000 | ORAL_TABLET | Freq: Every day | ORAL | Status: DC
  Administered 2013-09-10: 1 via ORAL
  Filled 2013-09-09 (×2): qty 1

## 2013-09-09 MED ORDER — DEXAMETHASONE SODIUM PHOSPHATE 10 MG/ML IJ SOLN
10.0000 mg | Freq: Three times a day (TID) | INTRAMUSCULAR | Status: AC
  Administered 2013-09-09: 10 mg via INTRAVENOUS
  Filled 2013-09-09 (×3): qty 1

## 2013-09-09 MED ORDER — PROMETHAZINE HCL 25 MG/ML IJ SOLN: 6.2500 mg | INTRAMUSCULAR | Status: DC | PRN

## 2013-09-09 MED ORDER — GLYCOPYRROLATE 0.2 MG/ML IJ SOLN
INTRAMUSCULAR | Status: AC
Start: 2013-09-09 — End: 2013-09-09
  Filled 2013-09-09: qty 1

## 2013-09-09 MED ORDER — ONDANSETRON HCL 4 MG/2ML IJ SOLN
INTRAMUSCULAR | Status: DC | PRN
  Administered 2013-09-09: 4 mg via INTRAVENOUS

## 2013-09-09 MED ORDER — BUPIVACAINE-EPINEPHRINE (PF) 0.25% -1:200000 IJ SOLN
INTRAMUSCULAR | Status: AC
  Filled 2013-09-09: qty 30

## 2013-09-09 MED ORDER — ZOLPIDEM TARTRATE 5 MG PO TABS: 5.0000 mg | ORAL_TABLET | Freq: Every evening | ORAL | Status: DC | PRN

## 2013-09-09 MED ORDER — EPHEDRINE SULFATE 50 MG/ML IJ SOLN
INTRAMUSCULAR | Status: AC
  Filled 2013-09-09: qty 1

## 2013-09-09 MED ORDER — GLYCOPYRROLATE 0.2 MG/ML IJ SOLN
INTRAMUSCULAR | Status: AC
  Filled 2013-09-09: qty 2

## 2013-09-09 MED ORDER — HYDROMORPHONE HCL PF 1 MG/ML IJ SOLN
INTRAMUSCULAR | Status: AC
  Filled 2013-09-09: qty 1

## 2013-09-09 MED ORDER — LIDOCAINE HCL (CARDIAC) 20 MG/ML IV SOLN
INTRAVENOUS | Status: AC
  Filled 2013-09-09: qty 5

## 2013-09-09 MED ORDER — METOCLOPRAMIDE HCL 5 MG PO TABS
5.0000 mg | ORAL_TABLET | Freq: Three times a day (TID) | ORAL | Status: DC | PRN
  Filled 2013-09-09: qty 2

## 2013-09-09 MED ORDER — SODIUM CHLORIDE 0.9 % IR SOLN
Status: DC | PRN
  Administered 2013-09-09: 3000 mL

## 2013-09-09 MED ORDER — PHENOL 1.4 % MT LIQD: 1.0000 | OROMUCOSAL | Status: DC | PRN

## 2013-09-09 MED ORDER — LEVOTHYROXINE SODIUM 112 MCG PO TABS
112.0000 ug | ORAL_TABLET | Freq: Every day | ORAL | Status: DC
  Administered 2013-09-09 – 2013-09-10 (×2): 112 ug via ORAL
  Filled 2013-09-09 (×3): qty 1

## 2013-09-09 MED ORDER — OXYCODONE HCL 5 MG PO TABS
5.0000 mg | ORAL_TABLET | Freq: Once | ORAL | Status: DC | PRN
Start: 2013-09-09 — End: 2013-09-09

## 2013-09-09 MED ORDER — NEOSTIGMINE METHYLSULFATE 10 MG/10ML IV SOLN
INTRAVENOUS | Status: AC
  Filled 2013-09-09: qty 1

## 2013-09-09 MED ORDER — OXYCODONE HCL 5 MG/5ML PO SOLN
5.0000 mg | Freq: Once | ORAL | Status: DC | PRN
Start: 2013-09-09 — End: 2013-09-09

## 2013-09-09 MED ORDER — MENTHOL 3 MG MT LOZG: 1.0000 | LOZENGE | OROMUCOSAL | Status: DC | PRN

## 2013-09-09 MED ORDER — RIVAROXABAN 10 MG PO TABS
10.0000 mg | ORAL_TABLET | Freq: Every day | ORAL | Status: DC
  Administered 2013-09-10: 10 mg via ORAL
  Filled 2013-09-09 (×2): qty 1

## 2013-09-09 MED ORDER — ZOLPIDEM TARTRATE 5 MG PO TABS: 10.0000 mg | ORAL_TABLET | Freq: Every evening | ORAL | Status: DC | PRN

## 2013-09-09 MED ORDER — CHLORHEXIDINE GLUCONATE 4 % EX LIQD
60.0000 mL | Freq: Once | CUTANEOUS | Status: DC
Start: 2013-09-09 — End: 2013-09-09
  Filled 2013-09-09: qty 60

## 2013-09-09 MED ORDER — LACTATED RINGERS IV SOLN
INTRAVENOUS | Status: DC | PRN
  Administered 2013-09-09 (×2): via INTRAVENOUS

## 2013-09-09 MED ORDER — LACTATED RINGERS IV SOLN: INTRAVENOUS | Status: DC

## 2013-09-09 MED ORDER — ONDANSETRON HCL 4 MG/2ML IJ SOLN
4.0000 mg | Freq: Four times a day (QID) | INTRAMUSCULAR | Status: DC | PRN
  Administered 2013-09-09: 4 mg via INTRAVENOUS
  Filled 2013-09-09: qty 2

## 2013-09-09 MED ORDER — BISACODYL 5 MG PO TBEC
10.0000 mg | DELAYED_RELEASE_TABLET | Freq: Every day | ORAL | Status: DC
  Administered 2013-09-09 – 2013-09-10 (×2): 10 mg via ORAL
  Filled 2013-09-09 (×2): qty 2

## 2013-09-09 MED ORDER — BUPROPION HCL ER (XL) 150 MG PO TB24
150.0000 mg | ORAL_TABLET | Freq: Every day | ORAL | Status: DC
  Administered 2013-09-09 – 2013-09-10 (×2): 150 mg via ORAL
  Filled 2013-09-09 (×4): qty 1

## 2013-09-09 MED ORDER — DEXAMETHASONE 6 MG PO TABS
10.0000 mg | ORAL_TABLET | Freq: Three times a day (TID) | ORAL | Status: AC
  Administered 2013-09-09 – 2013-09-10 (×2): 10 mg via ORAL
  Filled 2013-09-09 (×3): qty 1

## 2013-09-09 MED ORDER — CALCIUM CARBONATE-VITAMIN D 500-200 MG-UNIT PO TABS
1.0000 | ORAL_TABLET | Freq: Every day | ORAL | Status: DC
  Administered 2013-09-10: 1 via ORAL
  Filled 2013-09-09 (×3): qty 1

## 2013-09-09 MED ORDER — DOCUSATE SODIUM 100 MG PO CAPS
100.0000 mg | ORAL_CAPSULE | Freq: Two times a day (BID) | ORAL | Status: DC
  Administered 2013-09-09 – 2013-09-10 (×3): 100 mg via ORAL
  Filled 2013-09-09 (×4): qty 1

## 2013-09-09 MED ORDER — ROCURONIUM BROMIDE 50 MG/5ML IV SOLN
INTRAVENOUS | Status: AC
  Filled 2013-09-09: qty 1

## 2013-09-09 MED ORDER — CELECOXIB 200 MG PO CAPS
200.0000 mg | ORAL_CAPSULE | Freq: Two times a day (BID) | ORAL | Status: DC
  Filled 2013-09-09 (×4): qty 1

## 2013-09-09 MED ORDER — BUPIVACAINE-EPINEPHRINE (PF) 0.5% -1:200000 IJ SOLN
INTRAMUSCULAR | Status: DC | PRN
  Administered 2013-09-09: 30 mL via PERINEURAL

## 2013-09-09 MED ORDER — BUPIVACAINE-EPINEPHRINE 0.25% -1:200000 IJ SOLN
INTRAMUSCULAR | Status: DC | PRN
  Administered 2013-09-09: 30 mL

## 2013-09-09 MED ORDER — OXYCODONE HCL 5 MG PO TABS
5.0000 mg | ORAL_TABLET | ORAL | Status: DC | PRN
  Administered 2013-09-09 – 2013-09-10 (×9): 10 mg via ORAL
  Filled 2013-09-09 (×9): qty 2

## 2013-09-09 MED ORDER — DEXTROSE 5 % IV SOLN
INTRAVENOUS | Status: DC | PRN
  Administered 2013-09-09: 07:00:00 via INTRAVENOUS

## 2013-09-09 MED ORDER — ACETAMINOPHEN 10 MG/ML IV SOLN: 1000.0000 mg | Freq: Four times a day (QID) | INTRAVENOUS | Status: DC

## 2013-09-09 MED ORDER — VANCOMYCIN HCL IN DEXTROSE 1-5 GM/200ML-% IV SOLN
1000.0000 mg | Freq: Two times a day (BID) | INTRAVENOUS | Status: AC
  Administered 2013-09-09: 1000 mg via INTRAVENOUS
  Filled 2013-09-09: qty 200

## 2013-09-09 MED ORDER — VITAMIN D 50 MCG (2000 UT) PO CAPS: 2000.0000 [IU] | ORAL_CAPSULE | Freq: Every day | ORAL | Status: DC

## 2013-09-09 MED ORDER — PHENYLEPHRINE 40 MCG/ML (10ML) SYRINGE FOR IV PUSH (FOR BLOOD PRESSURE SUPPORT)
PREFILLED_SYRINGE | INTRAVENOUS | Status: AC
  Filled 2013-09-09: qty 10

## 2013-09-09 MED ORDER — POVIDONE-IODINE 7.5 % EX SOLN
Freq: Once | CUTANEOUS | Status: DC
  Filled 2013-09-09: qty 118

## 2013-09-09 MED ORDER — ATORVASTATIN CALCIUM 20 MG PO TABS
20.0000 mg | ORAL_TABLET | Freq: Every day | ORAL | Status: DC
  Administered 2013-09-10: 20 mg via ORAL
  Filled 2013-09-09 (×3): qty 1

## 2013-09-09 MED ORDER — ACETAMINOPHEN 10 MG/ML IV SOLN
INTRAVENOUS | Status: AC
  Administered 2013-09-09: 1000 mg
  Filled 2013-09-09: qty 100

## 2013-09-09 MED ORDER — ACETAMINOPHEN 325 MG PO TABS: 650.0000 mg | ORAL_TABLET | Freq: Four times a day (QID) | ORAL | Status: DC | PRN

## 2013-09-09 MED ORDER — ALUM & MAG HYDROXIDE-SIMETH 200-200-20 MG/5ML PO SUSP: 30.0000 mL | ORAL | Status: DC | PRN

## 2013-09-09 MED ORDER — ACETAMINOPHEN 10 MG/ML IV SOLN
1000.0000 mg | Freq: Four times a day (QID) | INTRAVENOUS | Status: AC
  Administered 2013-09-09 – 2013-09-10 (×3): 1000 mg via INTRAVENOUS
  Filled 2013-09-09 (×3): qty 100

## 2013-09-09 MED ORDER — SUCCINYLCHOLINE CHLORIDE 20 MG/ML IJ SOLN
INTRAMUSCULAR | Status: AC
  Filled 2013-09-09: qty 1

## 2013-09-09 MED ORDER — ROCURONIUM BROMIDE 100 MG/10ML IV SOLN
INTRAVENOUS | Status: DC | PRN
  Administered 2013-09-09: 40 mg via INTRAVENOUS

## 2013-09-09 MED ORDER — DEXAMETHASONE SODIUM PHOSPHATE 10 MG/ML IJ SOLN
INTRAMUSCULAR | Status: AC
  Filled 2013-09-09: qty 1

## 2013-09-09 MED ORDER — METOCLOPRAMIDE HCL 5 MG/ML IJ SOLN
5.0000 mg | Freq: Three times a day (TID) | INTRAMUSCULAR | Status: DC | PRN
  Administered 2013-09-09 – 2013-09-10 (×2): 10 mg via INTRAVENOUS
  Filled 2013-09-09 (×2): qty 2

## 2013-09-09 SURGICAL SUPPLY — 72 items
BANDAGE ELASTIC 6 VELCRO ST LF (GAUZE/BANDAGES/DRESSINGS) ×3 IMPLANT
BANDAGE ESMARK 6X9 LF (GAUZE/BANDAGES/DRESSINGS) ×1 IMPLANT
BLADE SAGITTAL 25.0X1.19X90 (BLADE) ×2 IMPLANT
BLADE SAGITTAL 25.0X1.19X90MM (BLADE) ×1
BLADE SAW SGTL 11.0X1.19X90.0M (BLADE) IMPLANT
BLADE SAW SGTL 13.0X1.19X90.0M (BLADE) ×3 IMPLANT
BLADE SURG 10 STRL SS (BLADE) ×6 IMPLANT
BNDG CMPR 9X6 STRL LF SNTH (GAUZE/BANDAGES/DRESSINGS) ×1
BNDG CMPR MED 15X6 ELC VLCR LF (GAUZE/BANDAGES/DRESSINGS) ×1
BNDG ELASTIC 6X15 VLCR STRL LF (GAUZE/BANDAGES/DRESSINGS) ×3 IMPLANT
BNDG ESMARK 6X9 LF (GAUZE/BANDAGES/DRESSINGS) ×3
BOWL SMART MIX CTS (DISPOSABLE) ×3 IMPLANT
CAPT RP KNEE ×3 IMPLANT
CEMENT HV SMART SET (Cement) ×6 IMPLANT
CLOSURE STERI-STRIP 1/2X4 (GAUZE/BANDAGES/DRESSINGS) ×1
CLOSURE WOUND 1/2 X4 (GAUZE/BANDAGES/DRESSINGS) ×1
CLSR STERI-STRIP ANTIMIC 1/2X4 (GAUZE/BANDAGES/DRESSINGS) ×2 IMPLANT
COVER SURGICAL LIGHT HANDLE (MISCELLANEOUS) ×3 IMPLANT
CUFF TOURNIQUET SINGLE 34IN LL (TOURNIQUET CUFF) ×3 IMPLANT
CUFF TOURNIQUET SINGLE 44IN (TOURNIQUET CUFF) IMPLANT
DRAPE EXTREMITY T 121X128X90 (DRAPE) ×3 IMPLANT
DRAPE INCISE IOBAN 66X45 STRL (DRAPES) ×3 IMPLANT
DRAPE PROXIMA HALF (DRAPES) ×3 IMPLANT
DRAPE U-SHAPE 47X51 STRL (DRAPES) ×3 IMPLANT
DRSG ADAPTIC 3X8 NADH LF (GAUZE/BANDAGES/DRESSINGS) ×3 IMPLANT
DRSG PAD ABDOMINAL 8X10 ST (GAUZE/BANDAGES/DRESSINGS) ×3 IMPLANT
DURAPREP 26ML APPLICATOR (WOUND CARE) ×6 IMPLANT
ELECT CAUTERY BLADE 6.4 (BLADE) ×3 IMPLANT
ELECT REM PT RETURN 9FT ADLT (ELECTROSURGICAL) ×3
ELECTRODE REM PT RTRN 9FT ADLT (ELECTROSURGICAL) ×1 IMPLANT
EVACUATOR 1/8 PVC DRAIN (DRAIN) ×3 IMPLANT
FACESHIELD WRAPAROUND (MASK) ×3 IMPLANT
GLOVE BIO SURGEON STRL SZ7 (GLOVE) ×3 IMPLANT
GLOVE BIOGEL PI IND STRL 7.0 (GLOVE) ×1 IMPLANT
GLOVE BIOGEL PI IND STRL 7.5 (GLOVE) ×1 IMPLANT
GLOVE BIOGEL PI INDICATOR 7.0 (GLOVE) ×2
GLOVE BIOGEL PI INDICATOR 7.5 (GLOVE) ×2
GLOVE SS BIOGEL STRL SZ 7.5 (GLOVE) ×1 IMPLANT
GLOVE SUPERSENSE BIOGEL SZ 7.5 (GLOVE) ×2
GOWN STRL REUS W/ TWL LRG LVL3 (GOWN DISPOSABLE) ×2 IMPLANT
GOWN STRL REUS W/ TWL XL LVL3 (GOWN DISPOSABLE) ×2 IMPLANT
GOWN STRL REUS W/TWL LRG LVL3 (GOWN DISPOSABLE) ×6
GOWN STRL REUS W/TWL XL LVL3 (GOWN DISPOSABLE) ×6
HANDPIECE INTERPULSE COAX TIP (DISPOSABLE) ×2
HOOD PEEL AWAY FACE SHEILD DIS (HOOD) ×6 IMPLANT
IMMOBILIZER KNEE 22 UNIV (SOFTGOODS) IMPLANT
KIT BASIN OR (CUSTOM PROCEDURE TRAY) ×3 IMPLANT
KIT ROOM TURNOVER OR (KITS) ×3 IMPLANT
MANIFOLD NEPTUNE II (INSTRUMENTS) ×3 IMPLANT
NS IRRIG 1000ML POUR BTL (IV SOLUTION) ×3 IMPLANT
PACK TOTAL JOINT (CUSTOM PROCEDURE TRAY) ×3 IMPLANT
PAD ARMBOARD 7.5X6 YLW CONV (MISCELLANEOUS) ×6 IMPLANT
PAD CAST 4YDX4 CTTN HI CHSV (CAST SUPPLIES) ×1 IMPLANT
PADDING CAST COTTON 4X4 STRL (CAST SUPPLIES) ×3
PADDING CAST COTTON 6X4 STRL (CAST SUPPLIES) ×3 IMPLANT
RUBBERBAND STERILE (MISCELLANEOUS) ×3 IMPLANT
SET HNDPC FAN SPRY TIP SCT (DISPOSABLE) ×1 IMPLANT
SPONGE GAUZE 4X4 12PLY (GAUZE/BANDAGES/DRESSINGS) ×3 IMPLANT
SPONGE GAUZE 4X4 12PLY STER LF (GAUZE/BANDAGES/DRESSINGS) ×3 IMPLANT
STRIP CLOSURE SKIN 1/2X4 (GAUZE/BANDAGES/DRESSINGS) ×2 IMPLANT
SUCTION FRAZIER TIP 10 FR DISP (SUCTIONS) ×3 IMPLANT
SUT ETHIBOND NAB CT1 #1 30IN (SUTURE) ×6 IMPLANT
SUT MNCRL AB 3-0 PS2 18 (SUTURE) ×3 IMPLANT
SUT VIC AB 0 CT1 27 (SUTURE) ×6
SUT VIC AB 0 CT1 27XBRD ANBCTR (SUTURE) ×2 IMPLANT
SUT VIC AB 2-0 CT1 27 (SUTURE) ×6
SUT VIC AB 2-0 CT1 TAPERPNT 27 (SUTURE) ×2 IMPLANT
SYR 30ML SLIP (SYRINGE) ×3 IMPLANT
TOWEL OR 17X24 6PK STRL BLUE (TOWEL DISPOSABLE) ×3 IMPLANT
TOWEL OR 17X26 10 PK STRL BLUE (TOWEL DISPOSABLE) ×3 IMPLANT
TRAY FOLEY CATH 16FR SILVER (SET/KITS/TRAYS/PACK) ×3 IMPLANT
WATER STERILE IRR 1000ML POUR (IV SOLUTION) ×6 IMPLANT

## 2013-09-09 NOTE — Evaluation (Signed)
Physical Therapy Evaluation Patient Details Name: Theresa Reese MRN: [id number] DOB: 11-26-1950 Today's Date: 09/09/2013   History of Present Illness  63 y.o. female s/p left TKA. Hx of hypothyroidism.  Clinical Impression  Pt is s/p left TKA resulting in the deficits listed below (see PT Problem List). She ambulated short distance (6 feet) post-op day #0 with min assist and demonstrates good knee stability with knee immobilizer in place. Pt with moderate intermittent nausea however no emesis; nurse aware. Pt will benefit from skilled PT to increase their independence and safety with mobility to allow discharge to the venue listed below. Anticipate she will do well with therapy and be safe for d/c to home with her daughter's assistance.     Follow Up Recommendations Home health PT;Supervision for mobility/OOB    Equipment Recommendations  None recommended by PT    Recommendations for Other Services OT consult     Precautions / Restrictions Precautions Precautions: Knee Precaution Comments: Reviewed knee precautions Required Braces or Orthoses: Knee Immobilizer - Left Restrictions Weight Bearing Restrictions: Yes LLE Weight Bearing: Weight bearing as tolerated      Mobility  Bed Mobility Overal bed mobility: Needs Assistance Bed Mobility: Supine to Sit     Supine to sit: Min guard     General bed mobility comments: Min guard for safety, VCs for technique. Requires extra time  Transfers Overall transfer level: Needs assistance Equipment used: Rolling walker (2 wheeled) Transfers: Sit to/from Stand Sit to Stand: Min guard         General transfer comment: Min guard for safety. VCs for hand placement. Education to prevent pulling from RW for safety.  Ambulation/Gait Ambulation/Gait assistance: Min assist Ambulation Distance (Feet): 6 Feet Assistive device: Rolling walker (2 wheeled) Gait Pattern/deviations: Step-to pattern;Decreased step length - right;Decreased  stance time - left;Antalgic   Gait velocity interpretation: Below normal speed for age/gender General Gait Details: Min assist for sequencing and walker placement. VCs for gait sequencing and VC/tactile cues for left knee extension in stance phase. No instances of buckling with knee immobilizer in place.  Educated on safe use of DME  Stairs            Wheelchair Mobility    Modified Rankin (Stroke Patients Only)       Balance Overall balance assessment: Needs assistance Sitting-balance support: No upper extremity supported;Feet supported Sitting balance-Leahy Scale: Fair     Standing balance support: Bilateral upper extremity supported Standing balance-Leahy Scale: Poor                               Pertinent Vitals/Pain 4/10 pain Nurse aware - administered pain medication at end of therapy session Patient repositioned in chair for comfort.     Home Living Family/patient expects to be discharged to:: Private residence Living Arrangements: Children Available Help at Discharge: Family;Available 24 hours/day Type of Home: House Home Access: Stairs to enter Entrance Stairs-Rails: Right;Left;None Entrance Stairs-Number of Steps: 3 Home Layout: One level Home Equipment: Walker - 2 wheels;Bedside commode      Prior Function Level of Independence: Independent               Hand Dominance   Dominant Hand: Right    Extremity/Trunk Assessment   Upper Extremity Assessment: Defer to OT evaluation           Lower Extremity Assessment: LLE deficits/detail   LLE Deficits / Details: decreased strength and ROM as  expected post-op     Communication   Communication: No difficulties  Cognition Arousal/Alertness: Awake/alert Behavior During Therapy: WFL for tasks assessed/performed Overall Cognitive Status: Within Functional Limits for tasks assessed                      General Comments General comments (skin integrity, edema, etc.):  Pt with nausea upon sitting at EOB and again after ambulating. No emesis, nurse is aware.    Exercises Total Joint Exercises Ankle Circles/Pumps: AROM;Both;10 reps;Supine Quad Sets: AROM;Left;10 reps;Supine      Assessment/Plan    PT Assessment Patient needs continued PT services  PT Diagnosis Difficulty walking;Abnormality of gait;Acute pain   PT Problem List Decreased strength;Decreased range of motion;Decreased activity tolerance;Decreased balance;Decreased mobility;Decreased knowledge of use of DME;Decreased knowledge of precautions;Pain  PT Treatment Interventions DME instruction;Gait training;Stair training;Functional mobility training;Therapeutic activities;Therapeutic exercise;Balance training;Neuromuscular re-education;Patient/family education;Modalities   PT Goals (Current goals can be found in the Care Plan section) Acute Rehab PT Goals Patient Stated Goal: Garden again PT Goal Formulation: With patient Time For Goal Achievement: 09/16/13 Potential to Achieve Goals: Good    Frequency 7X/week   Barriers to discharge        Co-evaluation               End of Session Equipment Utilized During Treatment: Gait belt;Left knee immobilizer Activity Tolerance: Patient tolerated treatment well Patient left: in chair;with call bell/phone within reach;with family/visitor present;with nursing/sitter in room Nurse Communication: Mobility status         Time: 1236-1316 PT Time Calculation (min): 40 min   Charges:   PT Evaluation $Initial PT Evaluation Tier I: 1 Procedure PT Treatments $Gait Training: 8-22 mins $Therapeutic Activity: 8-22 mins   PT G Codes:         [employer] WholesaleLogan Secor [place], [place]PT [phone]  [place] Mount[place]bour, Logan S 09/09/2013, 1:23 PM

## 2013-09-09 NOTE — Care Management Note (Signed)
CARE MANAGEMENT NOTE 09/09/2013  Patient:  Theresa Reese,Theresa Reese   Account Number:  [id number]  Date Initiated:  09/09/2013  Documentation initiated by:  Vance PeperBRADY,Jamal Haskin  Subjective/Objective Assessment:   63 yr old female s/Reese left total knee arthroplasty.     Action/Plan:   PT/OT eval  Patient preoperatively setup with [facility], no changes. Case manager will continue to monitor.   Anticipated DC Date:  09/10/2013   Anticipated DC Plan:  HOME W HOME HEALTH SERVICES      DC Planning Services  CM consult      PAC Choice  DURABLE MEDICAL EQUIPMENT  HOME HEALTH   Choice offered to / List presented to:          [facility]      [facility]  [facility]   Status of service:  In process, will continue to follow

## 2013-09-09 NOTE — H&P (View-Only) (Signed)
TOTAL KNEE ADMISSION H&P  Patient is being admitted for left uni vs total knee arthroplasty.  Subjective:  Chief Complaint:left knee pain.  HPI: Theresa Reese, 63 y.o. female, has a history of pain and functional disability in the left knee due to arthritis and has failed non-surgical conservative treatments for greater than 12 weeks to includeNSAID's and/or analgesics, corticosteriod injections, viscosupplementation injections, flexibility and strengthening excercises, supervised PT with diminished ADL's post treatment, use of assistive devices and activity modification.  Onset of symptoms was gradual, starting 7 years ago with gradually worsening course since that time. The patient noted prior procedures on the knee to include  arthroscopy and menisectomy on the left knee(s).  Patient currently rates pain in the left knee(s) at 10 out of 10 with activity. Patient has night pain, worsening of pain with activity and weight bearing, pain that interferes with activities of daily living, crepitus and joint swelling.  Patient has evidence of subchondral sclerosis, periarticular osteophytes and joint space narrowing by imaging studies.There is no active infection.  Patient Active Problem List   Diagnosis Date Noted  . Hypothyroid   . Lymphocytic colitis   . Left knee DJD    Past Medical History  Diagnosis Date  . Hypothyroid   . Insomnia   . Hypercholesteremia   . Depression with anxiety   . Lymphocytic colitis   . Left knee DJD     Past Surgical History  Procedure Laterality Date  . Cesarean section  1987  . Cesarean section  1990  . Low back surgery  1997    Dr Jule SerNudleman  . Low back surgery  1994    Dr Elesa Hackereaton  . Knee arthroscopy w/ meniscectomy Left 2014    Dr Shelle IronBeane     (Not in a hospital admission) Allergies  Allergen Reactions  . Erythromycin     Diarrhea   Hospitalized for dehydration   . Keflex [Cephalexin]     Yeast infection   . Penicillins Rash    Outpatient  Encounter Prescriptions as of 08/28/2013  Medication Sig  . atorvastatin (LIPITOR) 20 MG tablet Take 20 mg by mouth daily.  Marland Kitchen. buPROPion (WELLBUTRIN XL) 150 MG 24 hr tablet Take 150 mg by mouth daily.  Marland Kitchen. levothyroxine (SYNTHROID, LEVOTHROID) 112 MCG tablet Take 112 mcg by mouth daily before breakfast.  . zolpidem (AMBIEN) 10 MG tablet Take 10 mg by mouth at bedtime as needed for sleep.   History  Substance Use Topics  . Smoking status: Former Smoker    Quit date: 08/29/1970  . Smokeless tobacco: Not on file  . Alcohol Use: Yes     Comment: rarely    Family History  Problem Relation Age of Onset  . Heart disease Mother   . Heart attack Mother   . Hypertension Mother   . Heart attack Father   . Hypertension Father   . Stroke Father      Review of Systems  Constitutional: Negative.   HENT: Negative.   Eyes: Negative.   Respiratory: Negative.   Cardiovascular: Negative.   Gastrointestinal: Negative.   Genitourinary: Negative.   Musculoskeletal: Positive for joint pain.  Skin: Negative.   Neurological: Negative.   Endo/Heme/Allergies: Negative.     Objective:  Physical Exam  Constitutional: She is oriented to person, place, and time. She appears well-developed and well-nourished.  HENT:  Head: Normocephalic and atraumatic.  Mouth/Throat: Oropharynx is clear and moist.  Eyes: Conjunctivae and EOM are normal. Pupils are equal, round, and reactive  to light.  Neck: Neck supple.  Cardiovascular: Normal rate, regular rhythm and intact distal pulses.  Exam reveals no gallop and no friction rub.   No murmur heard. Respiratory: Breath sounds normal. No respiratory distress. She has no wheezes. She has no rales. She exhibits no tenderness.  GI: Bowel sounds are normal. She exhibits no distension and no mass. There is no tenderness. There is no rebound and no guarding.  Genitourinary:  Not pertinent to current symptomatology therefore not examined.  Musculoskeletal:  Examination  of her left knee reveals pain on the medial joint line positive medial McMurray's 1+effusion range of motion -5 to 125 degrees, knee is stable with normal patella tracking. Exam of the right knee reveals full range of motion without pain swelling weakness or instability. Vascular exam: pulses 2+ and symmetric.  Neurological: She is alert and oriented to person, place, and time.  Skin: Skin is warm and dry.  Psychiatric: She has a normal mood and affect. Her behavior is normal.    Vital signs in last 24 hours: @ Last recorded: 06/10 1500   BP: 142/84 Pulse: 76  Temp: 98.2 F (36.8 C)    Height: 5' 6" (1.676 m)    Weight: 68.04 kg (150 lb)     Labs:   Estimated body mass index is 24.22 kg/(m^2) as calculated from the following:   Height as of this encounter: 5' 6" (1.676 m).   Weight as of this encounter: 68.04 kg (150 lb).   Imaging Review Plain radiographs demonstrate severe degenerative joint disease of the left knee(s). The overall alignment ismild varus. The bone quality appears to be good for age and reported activity level.  Assessment/Plan:  End stage arthritis, left knee  Patient Active Problem List   Diagnosis Date Noted  . Hypothyroid   . Lymphocytic colitis   . Left knee DJD     The patient history, physical examination, clinical judgment of the provider and imaging studies are consistent with end stage degenerative joint disease of the left knee(s) and total knee arthroplasty is deemed medically necessary. The treatment options including medical management, injection therapy arthroscopy and arthroplasty were discussed at length. The risks and benefits of total knee arthroplasty were presented and reviewed. The risks due to aseptic loosening, infection, stiffness, patella tracking problems, thromboembolic complications and other imponderables were discussed. The patient acknowledged the explanation, agreed to proceed with the plan and consent was signed. Patient is  being admitted for inpatient treatment for surgery, pain control, PT, OT, prophylactic antibiotics, VTE prophylaxis, progressive ambulation and ADL's and discharge planning. The patient is planning to be discharged home with home health services   

## 2013-09-09 NOTE — Progress Notes (Signed)
Orthopedic Tech Progress Note Patient Details:  Theresa Reese 1950/07/06 [id number] Off cpm at 9:40 pm Patient ID: Theresa Reese, female   DOB: 1950/07/06, 63 y.o.   MRN: [id number]   Jennye MoccasinHughes, Anthony Craig 09/09/2013, 9:41 PM

## 2013-09-09 NOTE — Anesthesia Postprocedure Evaluation (Signed)
  Anesthesia Post-op Note  Patient: Berneta SagesKathy P Tello  Procedure(s) Performed: Procedure(s): LEFT TOTAL KNEE ARTHROPLASTY (Left)  Patient Location: PACU  Anesthesia Type:GA combined with regional for post-op pain  Level of Consciousness: awake and alert   Airway and Oxygen Therapy: Patient Spontanous Breathing  Post-op Pain: mild  Post-op Assessment: Post-op Vital signs reviewed and Patient's Cardiovascular Status Stable  Post-op Vital Signs: stable  Last Vitals:  Filed Vitals:   09/09/13 1017  BP: 171/93  Pulse: 90  Temp:   Resp: 11    Complications: No apparent anesthesia complications

## 2013-09-09 NOTE — Progress Notes (Signed)
Orthopedic Tech Progress Note Patient Details:  Theresa Reese 10/15/1950 [id number]  CPM Left Knee CPM Left Knee: On Left Knee Flexion (Degrees): 60 Left Knee Extension (Degrees): 0 Additional Comments: Trapeze bar and foot roll   [name], [name] 09/09/2013, 10:18 AM

## 2013-09-09 NOTE — Interval H&P Note (Signed)
History and Physical Interval Note:  09/09/2013 7:05 AM  Theresa Reese  has presented today for surgery, with the diagnosis of OA LEFT KNEE  The various methods of treatment have been discussed with the patient and family. After consideration of risks, benefits and other options for treatment, the patient has consented to  Procedure(s): LEFT TOTAL KNEE ARTHROPLASTY (Left) as a surgical intervention .  The patient's history has been reviewed, patient examined, no change in status, stable for surgery.  I have reviewed the patient's chart and labs.  Questions were answered to the patient's satisfaction.     Salvatore MarvelWAINER,ROBERT A

## 2013-09-09 NOTE — Anesthesia Procedure Notes (Addendum)
Anesthesia Regional Block:  Adductor canal block  Pre-Anesthetic Checklist: ,, timeout performed, Correct Patient, Correct Site, Correct Laterality, Correct Procedure, Correct Position, site marked, Risks and benefits discussed,  Surgical consent,  Pre-op evaluation,  At surgeon's request and post-op pain management  Laterality: Left and Upper  Prep: chloraprep       Needles:   Needle Type: Echogenic Needle     Needle Length: 9cm 9 cm Needle Gauge: 21 and 21 G    Additional Needles: Adductor canal block Narrative:  Start time: 09/09/2013 7:17 AM End time: 09/09/2013 7:15 AM Injection made incrementally with aspirations every 5 mL.  Performed by: Personally  Anesthesiologist: T Massagee  Additional Notes: Sedation , monitors on , tolerated well   Procedure Name: Intubation Date/Time: 09/09/2013 7:28 AM Performed by: Romie MinusOCK, Daionna Crossland K Pre-anesthesia Checklist: Patient identified, Emergency Drugs available, Suction available, Patient being monitored and Timeout performed Patient Re-evaluated:Patient Re-evaluated prior to inductionOxygen Delivery Method: Circle system utilized Preoxygenation: Pre-oxygenation with 100% oxygen Intubation Type: IV induction Ventilation: Mask ventilation without difficulty Laryngoscope Size: Miller and 2 Grade View: Grade I Tube type: Oral Tube size: 7.0 mm Number of attempts: 1 Airway Equipment and Method: Stylet Placement Confirmation: ETT inserted through vocal cords under direct vision,  positive ETCO2,  CO2 detector and breath sounds checked- equal and bilateral Secured at: 21 cm Tube secured with: Tape Dental Injury: Teeth and Oropharynx as per pre-operative assessment

## 2013-09-09 NOTE — Progress Notes (Signed)
Called Dr.Massagee for sign out 

## 2013-09-09 NOTE — Progress Notes (Signed)
Utilization review completed.  

## 2013-09-09 NOTE — Transfer of Care (Signed)
Immediate Anesthesia Transfer of Care Note  Patient: Theresa Reese  Procedure(s) Performed: Procedure(s): LEFT TOTAL KNEE ARTHROPLASTY (Left)  Patient Location: PACU  Anesthesia Type:General  Level of Consciousness: awake, oriented and patient cooperative  Airway & Oxygen Therapy: Patient Spontanous Breathing and Patient connected to nasal cannula oxygen  Post-op Assessment: Report given to PACU RN and Post -op Vital signs reviewed and stable  Post vital signs: Reviewed  Complications: No apparent anesthesia complications

## 2013-09-09 NOTE — Op Note (Signed)
MRN:     [id number] DOB/AGE:    [birth year] / 63 y.o.       OPERATIVE REPORT    DATE OF PROCEDURE:  09/09/2013       PREOPERATIVE DIAGNOSIS:   OA LEFT KNEE      Estimated body mass index is 24.06 kg/(m^2) as calculated from the following:   Height as of this encounter: 5\' 6"  (1.676 m).   Weight as of this encounter: 67.586 kg (149 lb).                                                        POSTOPERATIVE DIAGNOSIS:   OA LEFT KNEE                                                                      PROCEDURE:  Procedure(s): LEFT TOTAL KNEE ARTHROPLASTY Using Depuy Sigma RP implants #3 Femur, #3Tibia, 12.425mm sigma RP bearing, 32 Patella     SURGEON: Omaree Fuqua A    ASSISTANT:  Kirstin Shepperson PA-C   (Present and scrubbed throughout the case, critical for assistance with exposure, retraction, instrumentation, and closure.)         ANESTHESIA: GET with Femoral Nerve Block  DRAINS: foley, 2 medium hemovac in knee   TOURNIQUET TIME: 75min   COMPLICATIONS:  None     SPECIMENS: None   INDICATIONS FOR PROCEDURE: The patient has  OA LEFT KNEE, varus deformities, XR shows bone on bone arthritis. Patient has failed all conservative measures including anti-inflammatory medicines, narcotics, attempts at  exercise and weight loss, cortisone injections and viscosupplementation.  Risks and benefits of surgery have been discussed, questions answered.   DESCRIPTION OF PROCEDURE: The patient identified by armband, received  right femoral nerve block and IV antibiotics[facility] Patient taken to the operating room, appropriate anesthetic  monitors were attached General endotracheal anesthesia induced with  the patient in supine position, Foley catheter was inserted. Tourniquet  applied high to the operative thigh. Lateral post and foot positioner  applied to the table, the lower extremity was then prepped and draped  in usual sterile fashion from the ankle to the  tourniquet. Time-out procedure was performed. The limb was wrapped with an Esmarch bandage and the tourniquet inflated to 365 mmHg. We began the operation by making the anterior midline incision starting at handbreadth above the patella going over the patella 1 cm medial to and  4 cm distal to the tibial tubercle. Small bleeders in the skin and the  subcutaneous tissue identified and cauterized. Transverse retinaculum was incised and reflected medially and a medial parapatellar arthrotomy was accomplished. the patella was everted and theprepatellar fat pad resected. The superficial medial collateral  ligament was then elevated from anterior to posterior along the proximal  flare of the tibia and anterior half of the menisci resected. The knee was hyperflexed exposing bone on bone arthritis. Peripheral and notch osteophytes as well as the cruciate ligaments were then resected. We continued to  work our way around posteriorly along the proximal tibia, and  externally  rotated the tibia subluxing it out from underneath the femur. A McHale  retractor was placed through the notch and a lateral Hohmann retractor  placed, and we then drilled through the proximal tibia in line with the  axis of the tibia followed by an intramedullary guide rod and 2-degree  posterior slope cutting guide. The tibial cutting guide was pinned into place  allowing resection of 4 mm of bone medially and about 6 mm of bone  laterally because of her varus deformity. Satisfied with the tibial resection, we then  entered the distal femur 2 mm anterior to the PCL origin with the  intramedullary guide rod and applied the distal femoral cutting guide  set at 11mm, with 5 degrees of valgus. This was pinned along the  epicondylar axis. At this point, the distal femoral cut was accomplished without difficulty. We then sized for a #3 femoral component and pinned the guide in 3 degrees of external rotation.The chamfer cutting guide was pinned  into place. The anterior, posterior, and chamfer cuts were accomplished without difficulty followed by  the Sigma RP box cutting guide and the box cut. We also removed posterior osteophytes from the posterior femoral condyles. At this  time, the knee was brought into full extension. We checked our  extension and flexion gaps and found them symmetric at 12.175mm.  The patella thickness measured at 22 mm. We set the cutting guide at 14 and removed the posterior 8 mm  of the patella sized for 32 button and drilled the lollipop. The knee  was then once again hyperflexed exposing the proximal tibia. We sized for a #3 tibial base plate, applied the smokestack and the conical reamer followed by the the Delta fin keel punch. We then hammered into place the Sigma RP trial femoral component, inserted a 12.5-mm trial bearing, trial patellar button, and took the knee through range of motion from 0-130 degrees. No thumb pressure was required for patellar  tracking. At this point, all trial components were removed, a double batch of DePuy HV cement  was mixed and applied to all bony metallic mating surfaces except for the posterior condyles of the femur itself. In order, we  hammered into place the tibial tray and removed excess cement, the femoral component and removed excess cement, a 12.5-mm Sigma RP bearing  was inserted, and the knee brought to full extension with compression.  The patellar button was clamped into place, and excess cement  removed. While the cement cured the wound was irrigated out with normal saline solution pulse lavage, and medium Hemovac drains were placed.. Ligament stability and patellar tracking were checked and found to be excellent. The tourniquet was then released and hemostasis was obtained with cautery. The parapatellar arthrotomy was closed with  #1 ethibond suture. The subcutaneous tissue with 0 and 2-0 undyed  Vicryl suture, and 4-0 Monocryl.. A dressing of Xeroform,  4 x 4,  dressing sponges, Webril, and Ace wrap applied. Needle and sponge count were correct times 2.The patient awakened, extubated, and taken to recovery room without difficulty. Vascular status was normal, pulses 2+ and symmetric.   Theresa Reese A 09/09/2013, 8:41 AM

## 2013-09-09 NOTE — Anesthesia Preprocedure Evaluation (Addendum)
Anesthesia Evaluation  Patient identified by MRN, date of birth, ID band Patient awake    Reviewed: Allergy & Precautions  Airway Mallampati: I  Neck ROM: Full    Dental  (+) Edentulous Upper, Edentulous Lower   Pulmonary former smoker,  breath sounds clear to auscultation        Cardiovascular negative cardio ROS  Rhythm:Regular Rate:Normal     Neuro/Psych    GI/Hepatic negative GI ROS, Neg liver ROS,   Endo/Other  Hypothyroidism   Renal/GU      Musculoskeletal  (+) Arthritis -, Osteoarthritis,    Abdominal   Peds  Hematology   Anesthesia Other Findings   Reproductive/Obstetrics                          Anesthesia Physical Anesthesia Plan  ASA: I  Anesthesia Plan: General   Post-op Pain Management:    Induction: Intravenous  Airway Management Planned: LMA and Oral ETT  Additional Equipment:   Intra-op Plan:   Post-operative Plan: Extubation in OR  Informed Consent: I have reviewed the patients History and Physical, chart, labs and discussed the procedure including the risks, benefits and alternatives for the proposed anesthesia with the patient or authorized representative who has indicated his/her understanding and acceptance.     Plan Discussed with: CRNA and Surgeon  Anesthesia Plan Comments:        Anesthesia Quick Evaluation

## 2013-09-10 ENCOUNTER — Encounter (HOSPITAL_COMMUNITY): Payer: Self-pay | Admitting: Orthopedic Surgery

## 2013-09-10 DIAGNOSIS — M79609 Pain in unspecified limb: Secondary | ICD-10-CM

## 2013-09-10 DIAGNOSIS — M7989 Other specified soft tissue disorders: Secondary | ICD-10-CM

## 2013-09-10 LAB — BASIC METABOLIC PANEL
BUN: 13 mg/dL (ref 6–23)
CALCIUM: 9.2 mg/dL (ref 8.4–10.5)
CO2: 24 mEq/L (ref 19–32)
Chloride: 102 mEq/L (ref 96–112)
Creatinine, Ser: 0.72 mg/dL (ref 0.50–1.10)
GLUCOSE: 156 mg/dL — AB (ref 70–99)
Potassium: 4 mEq/L (ref 3.7–5.3)
Sodium: 140 mEq/L (ref 137–147)

## 2013-09-10 LAB — CBC
HEMATOCRIT: 31.7 % — AB (ref 36.0–46.0)
Hemoglobin: 10.9 g/dL — ABNORMAL LOW (ref 12.0–15.0)
MCH: 31.3 pg (ref 26.0–34.0)
MCHC: 34.4 g/dL (ref 30.0–36.0)
MCV: 91.1 fL (ref 78.0–100.0)
Platelets: 231 10*3/uL (ref 150–400)
RBC: 3.48 MIL/uL — ABNORMAL LOW (ref 3.87–5.11)
RDW: 12.2 % (ref 11.5–15.5)
WBC: 12.8 10*3/uL — ABNORMAL HIGH (ref 4.0–10.5)

## 2013-09-10 MED ORDER — RIVAROXABAN 10 MG PO TABS: ORAL_TABLET | ORAL | Status: DC

## 2013-09-10 MED ORDER — CELECOXIB 200 MG PO CAPS: 200.0000 mg | ORAL_CAPSULE | Freq: Two times a day (BID) | ORAL | Status: DC

## 2013-09-10 MED ORDER — ONDANSETRON HCL 4 MG PO TABS: 4.0000 mg | ORAL_TABLET | Freq: Four times a day (QID) | ORAL | Status: DC | PRN

## 2013-09-10 MED ORDER — OXYCODONE HCL 5 MG PO TABS: ORAL_TABLET | ORAL | Status: DC

## 2013-09-10 MED ORDER — BISACODYL 5 MG PO TBEC: DELAYED_RELEASE_TABLET | ORAL | Status: DC

## 2013-09-10 MED ORDER — DEXAMETHASONE SODIUM PHOSPHATE 10 MG/ML IJ SOLN
10.0000 mg | Freq: Once | INTRAMUSCULAR | Status: AC
  Administered 2013-09-10: 10 mg via INTRAVENOUS
  Filled 2013-09-10: qty 1

## 2013-09-10 MED ORDER — DSS 100 MG PO CAPS: ORAL_CAPSULE | ORAL | Status: DC

## 2013-09-10 NOTE — Progress Notes (Signed)
Patient discharged home with daughter. Discharge instructions and pain medications given.

## 2013-09-10 NOTE — Progress Notes (Signed)
Physical Therapy Treatment Patient Details Name: Theresa Reese MRN: [id number] DOB: Oct 17, 1950 Today's Date: 09/10/2013    History of Present Illness 63 y.o. female s/p left TKA. Hx of hypothyroidism.    PT Comments    Patient is progressing well towards physical therapy goals, ambulating up to 225 feet at supervision level using rolling walker. Stair training completed this AM and pt reports feeling confident with this task. Feel pt is adequate for d/c from PT standpoint. Patient will continue to benefit from skilled physical therapy services with HHPT to further improve independence with functional mobility.    Follow Up Recommendations  Home health PT;Supervision for mobility/OOB     Equipment Recommendations  None recommended by PT    Recommendations for Other Services OT consult     Precautions / Restrictions Precautions Precautions: Knee Precaution Comments: Reviewed knee precautions Required Braces or Orthoses: Knee Immobilizer - Left Restrictions Weight Bearing Restrictions: Yes LLE Weight Bearing: Weight bearing as tolerated    Mobility  Bed Mobility                  Transfers Overall transfer level: Needs assistance Equipment used: Rolling walker (2 wheeled) Transfers: Sit to/from Stand Sit to Stand: Supervision         General transfer comment: Supervision for safety. Demonstrates correct hand placement and technique.  Ambulation/Gait Ambulation/Gait assistance: Supervision Ambulation Distance (Feet): 225 Feet Assistive device: Rolling walker (2 wheeled) Gait Pattern/deviations: Step-through pattern;Decreased stance time - left;Antalgic (decreased terminal knee extension Lt)   Gait velocity interpretation: Below normal speed for age/gender General Gait Details: Supervision for safety. VCs for Lt knee extension with quad activation. No instances of buckling without knee immobilizer in place. good stability with rolling  walker.   Stairs Stairs: Yes Stairs assistance: Supervision Stair Management: One rail Left;Step to pattern;Sideways Number of Stairs: 2 General stair comments: Educated on safe stair navigation. VCs for sequencing. Pt performs well and has no further questions concerning this task.  Wheelchair Mobility    Modified Rankin (Stroke Patients Only)       Balance                                    Cognition Arousal/Alertness: Awake/alert Behavior During Therapy: WFL for tasks assessed/performed Overall Cognitive Status: Within Functional Limits for tasks assessed                      Exercises Total Joint Exercises Ankle Circles/Pumps: AROM;Both;10 reps;Supine Quad Sets: AROM;Left;Supine;5 reps Goniometric ROM: 7-92 degrees Lt knee flexion in sitting.    General Comments        Pertinent Vitals/Pain 3/10 pain Patient repositioned in chair for comfort.     Home Living                      Prior Function            PT Goals (current goals can now be found in the care plan section) Acute Rehab PT Goals PT Goal Formulation: With patient Time For Goal Achievement: 09/16/13 Potential to Achieve Goals: Good Progress towards PT goals: Progressing toward goals    Frequency  7X/week    PT Plan Current plan remains appropriate    Co-evaluation             End of Session   Activity Tolerance: Patient tolerated treatment well Patient left: in  chair;with call bell/phone within reach     Time: 0845-0916 PT Time Calculation (min): 31 min  Charges:  $Gait Training: 23-37 mins                    G Codes:      Charlsie MerlesLogan Secor Barbour, [place] CarolinaPT [phone]  Berton MountBarbour, Logan S 09/10/2013, 10:07 AM

## 2013-09-10 NOTE — Progress Notes (Signed)
Physical Therapy Treatment Patient Details Name: Theresa SagesKathy P Reese MRN: [id number] DOB: December 07, 1950 Today's Date: 09/10/2013    History of Present Illness 63 y.o. female s/p left TKA. Hx of hypothyroidism.    PT Comments    Pt ambulatory distance limited by pain this afternoon, as she complains of significant left calf pain which is tender to palpation, increased with passive dorsiflexion and there is notable calor. Nurse has been notified and is in room at end of PT session. Functionally, pt continues to mobilize safely and will continue to benefit from skilled PT with HHPT services upon d/c. Adequate from PT standpoint for d/c when medically ready.   Follow Up Recommendations  Home health PT;Supervision for mobility/OOB     Equipment Recommendations  None recommended by PT    Recommendations for Other Services OT consult     Precautions / Restrictions Precautions Precautions: Knee Precaution Comments: Reviewed knee precautions Required Braces or Orthoses: Knee Immobilizer - Left Restrictions Weight Bearing Restrictions: Yes LLE Weight Bearing: Weight bearing as tolerated    Mobility  Bed Mobility Overal bed mobility: Modified Independent Bed Mobility: Sit to Supine     Supine to sit: Min guard     General bed mobility comments: pt used BUE to lift leg across bed for positioning  Transfers Overall transfer level: Needs assistance Equipment used: Rolling walker (2 wheeled) Transfers: Sit to/from Stand Sit to Stand: Supervision         General transfer comment: Supervision for safety. Demonstrates correct hand placement and technique. Performed from lowest bed setting  Ambulation/Gait Ambulation/Gait assistance: Supervision Ambulation Distance (Feet): 50 Feet Assistive device: Rolling walker (2 wheeled) Gait Pattern/deviations: Step-through pattern;Decreased step length - right;Decreased stance time - left;Antalgic (decreased terminal knee extension Lt)   Gait  velocity interpretation: Below normal speed for age/gender General Gait Details: Supervision for safety. VCs for Lt knee extension with quad activation. No instances of buckling without knee immobilizer in place. Pt complains of significant Lt calf pain during gait.   Stairs            Wheelchair Mobility    Modified Rankin (Stroke Patients Only)       Balance Overall balance assessment: Needs assistance Sitting-balance support: No upper extremity supported;Feet supported Sitting balance-Leahy Scale: Fair     Standing balance support: Bilateral upper extremity supported;During functional activity Standing balance-Leahy Scale: Poor                      Cognition Arousal/Alertness: Awake/alert Behavior During Therapy: WFL for tasks assessed/performed Overall Cognitive Status: Within Functional Limits for tasks assessed                      Exercises      General Comments General comments (skin integrity, edema, etc.): Pt complains of significant left calf pain. Very tender to palpation, notable calor, increased pain with passive dorsiflexion. Nurse notified.      Pertinent Vitals/Pain Pt reports "a lot of calf pain" in LLE. Nurse notified Patient repositioned in bed for comfort.     Home Living Family/patient expects to be discharged to:: Private residence Living Arrangements: Children Available Help at Discharge: Family;Available 24 hours/day Type of Home: House Home Access: Stairs to enter Entrance Stairs-Rails: Right;Left;None Home Layout: One level Home Equipment: Environmental consultantWalker - 2 wheels;Bedside commode Additional Comments: discussed using the bathroom that has walker accessiblity for home setup.    Prior Function Level of Independence: Independent  PT Goals (current goals can now be found in the care plan section) Acute Rehab PT Goals Patient Stated Goal: get back to walking better PT Goal Formulation: With patient Time For Goal  Achievement: 09/16/13 Potential to Achieve Goals: Good Progress towards PT goals: Progressing toward goals    Frequency  7X/week    PT Plan Current plan remains appropriate    Co-evaluation             End of Session   Activity Tolerance: Patient limited by pain Patient left: with call bell/phone within reach;in bed;with nursing/sitter in room     Time: 1553-1615 PT Time Calculation (min): 22 min  Charges:  $Gait Training: 8-22 mins                    G Codes:      Charlsie MerlesLogan Secor Barbour, [place]PT [phone]  Berton MountBarbour, Logan S 09/10/2013, 4:28 PM

## 2013-09-10 NOTE — Progress Notes (Signed)
VASCULAR LAB PRELIMINARY  PRELIMINARY  PRELIMINARY  PRELIMINARY  Left lower extremity venous duplex completed.    Preliminary report:  Left:  No evidence of DVT, superficial thrombosis, or Baker's cyst.  SLAUGHTER, [place], RVS 09/10/2013, 7:02 PM

## 2013-09-10 NOTE — Care Management Note (Signed)
CARE MANAGEMENT NOTE 09/10/2013  Patient:  Theresa Reese,Darthy P   Account Number:  [id number]  Date Initiated:  09/09/2013  Documentation initiated by:  Vance PeperBRADY,Kalayna Noy  Subjective/Objective Assessment:   63 yr old female s/p left total knee arthroplasty.     Action/Plan:   PT/OT eval  CM spoke with patient concerning home health and DME needs. Patient preoperatively setup with [facility], no changes. Has family support at discharge   Anticipated DC Date:  09/10/2013   Anticipated DC Plan:  HOME W HOME HEALTH SERVICES      DC Planning Services  CM consult      PAC Choice  DURABLE MEDICAL EQUIPMENT  HOME HEALTH   Choice offered to / List presented to:  C-1 Patient   DME arranged  3-N-1  WALKER - ROLLING  CPM      DME agency  [employer]     HH arranged  HH-2 PT      HH agency  [facility]   Status of service:  Completed, signed off Medicare Important Message given?   (If response is "NO", the following Medicare IM given date fields will be blank) Date Medicare IM given:   Date Additional Medicare IM given:    Discharge Disposition:  HOME W HOME HEALTH SERVICES  Per UR Regulation:  Reviewed for med. necessity/level of care/duration of stay

## 2013-09-10 NOTE — Progress Notes (Signed)
Patient complained of left calf pain and tenderness. Notify PA. Decadron and Venous Dopplers to R/O DVT ordered.

## 2013-09-10 NOTE — Discharge Summary (Signed)
Patient ID: Theresa Reese MRN: [id number] DOB/AGE: [birth year] 63 y.o.  Admit date: 09/09/2013 Discharge date: 09/10/2013  Admission Diagnoses:  Principal Problem:   Left knee DJD Active Problems:   Hypothyroid   Lymphocytic colitis   Insomnia   Depression with anxiety   DJD (degenerative joint disease) of knee   Discharge Diagnoses:  Same  Past Medical History  Diagnosis Date  . Hypothyroid   . Insomnia   . Hypercholesteremia   . Depression with anxiety   . Lymphocytic colitis   . Left knee DJD   . Depression   . Anxiety     Surgeries: Procedure(s): LEFT TOTAL KNEE ARTHROPLASTY on 09/09/2013   Consultants:    Discharged Condition: Improved  Hospital Course: Theresa SagesKathy P Chriswell is an 63 y.o. female who was admitted 09/09/2013 for operative treatment ofLeft knee DJD. Patient has severe unremitting pain that affects sleep, daily activities, and work/hobbies. After pre-op clearance the patient was taken to the operating room on 09/09/2013 and underwent  Procedure(s): LEFT TOTAL KNEE ARTHROPLASTY.    Patient was given perioperative antibiotics: Anti-infectives   Start     Dose/Rate Route Frequency Ordered Stop   09/09/13 1900  vancomycin (VANCOCIN) IVPB 1000 mg/200 mL premix     1,000 mg 200 mL/hr over 60 Minutes Intravenous Every 12 hours 09/09/13 1045 09/09/13 1924   09/09/13 0600  vancomycin (VANCOCIN) IVPB 1000 mg/200 mL premix     1,000 mg 200 mL/hr over 60 Minutes Intravenous On call to O.R. 09/08/13 1322 09/09/13 0810       Patient was given sequential compression devices, early ambulation, and chemoprophylaxis to prevent DVT.  Patient benefited maximally from hospital stay and there were no complications.    Recent vital signs: Patient Vitals for the past 24 hrs:  BP Temp Temp src Pulse Resp SpO2  09/10/13 0415 131/66 mmHg 98.1 F (36.7 C) Tympanic 78 16 99 %  09/10/13 0400 - - - - 16 -  09/10/13 0033 124/69 mmHg 98 F (36.7 C) Oral 87 16 99 %  09/10/13 0000 -  - - - 17 100 %  09/09/13 2057 131/74 mmHg 97.7 F (36.5 C) Oral 81 16 100 %  09/09/13 2000 - - - - 17 100 %  09/09/13 1433 141/70 mmHg 97.4 F (36.3 C) Oral 89 16 100 %  09/09/13 1159 - - - - 18 99 %  09/09/13 1046 151/85 mmHg 97.7 F (36.5 C) - 85 16 100 %  09/09/13 1031 168/76 mmHg 97.5 F (36.4 C) - 87 15 94 %  09/09/13 1017 171/93 mmHg - - 90 11 99 %  09/09/13 1003 175/85 mmHg - - 89 10 100 %  09/09/13 0948 163/84 mmHg - - 86 8 100 %  09/09/13 0933 159/101 mmHg - - 91 11 100 %  09/09/13 0917 166/99 mmHg - - 98 11 99 %  09/09/13 0915 - 98 F (36.7 C) - - - -     Recent laboratory studies:  Recent Labs  09/10/13 0454  WBC 12.8*  HGB 10.9*  HCT 31.7*  PLT 231  NA 140  K 4.0  CL 102  CO2 24  BUN 13  CREATININE 0.72  GLUCOSE 156*  CALCIUM 9.2     Discharge Medications:     Medication List         atorvastatin 20 MG tablet  Commonly known as:  LIPITOR  Take 20 mg by mouth daily.     b complex vitamins tablet  Take 1 tablet by mouth daily.     bisacodyl 5 MG EC tablet  Commonly known as:  DULCOLAX  Take 2 tablets every night with dinner until bowel movement.  LAXITIVE.  Restart if two days since last bowel movement     buPROPion 150 MG 24 hr tablet  Commonly known as:  WELLBUTRIN XL  Take 150 mg by mouth daily.     CALCIUM 500 PO  Take 500 mg by mouth daily.     celecoxib 200 MG capsule  Commonly known as:  CELEBREX  Take 1 capsule (200 mg total) by mouth every 12 (twelve) hours.     DSS 100 MG Caps  1 tab 2 times a day while on narcotics.  STOOL SOFTENER     levothyroxine 112 MCG tablet  Commonly known as:  SYNTHROID, LEVOTHROID  Take 112 mcg by mouth daily before breakfast.     multivitamin with minerals Tabs tablet  Take 1 tablet by mouth daily.     ondansetron 4 MG tablet  Commonly known as:  ZOFRAN  Take 1-2 tablets (4-8 mg total) by mouth every 6 (six) hours as needed for nausea.     oxyCODONE 5 MG immediate release tablet  Commonly  known as:  Oxy IR/ROXICODONE  1-2 tablets every 4-6 hrs as needed for pain     rivaroxaban 10 MG Tabs tablet  Commonly known as:  XARELTO  1 tab a day for the next 30 days to prevent blood clots     vitamin C 500 MG tablet  Commonly known as:  ASCORBIC ACID  Take 500 mg by mouth daily.     Vitamin D 2000 UNITS Caps  Take 2,000 Units by mouth daily.     zolpidem 10 MG tablet  Commonly known as:  AMBIEN  Take 10 mg by mouth at bedtime as needed for sleep.        Diagnostic Studies: Dg Chest 2 View  08/30/2013   CLINICAL DATA:  Preop for knee replacement  EXAM: CHEST  2 VIEW  COMPARISON:  12/26/2005  FINDINGS: Cardiomediastinal silhouette is stable. No acute infiltrate or pleural effusion. No pulmonary edema. Mild degenerative changes mid thoracic spine.  IMPRESSION: No active cardiopulmonary disease.   Electronically Signed   By: Natasha MeadLiviu  Pop M.D.   On: 08/30/2013 15:51    Disposition:       Discharge Instructions   CPM    Complete by:  As directed   Continuous passive motion machine (CPM):      Use the CPM from 0 to 90 for 6 hours per day.       You may break it up into 2 or 3 sessions per day.      Use CPM for 2 weeks or until you are told to stop.     Call MD / Call 911    Complete by:  As directed   If you experience chest pain or shortness of breath, CALL 911 and be transported to the hospital emergency room.  If you develope a fever above 101 F, pus (white drainage) or increased drainage or redness at the wound, or calf pain, call your surgeon's office.     Change dressing    Complete by:  As directed   Change the dressing daily with sterile 4 x 4 inch gauze dressing and apply TED hose.  You may clean the incision with alcohol prior to redressing.     Constipation Prevention    Complete by:  As directed   Drink plenty of fluids.  Prune juice may be helpful.  You may use a stool softener, such as Colace (over the counter) 100 mg twice a day.  Use MiraLax (over the counter)  for constipation as needed.     Diet - low sodium heart healthy    Complete by:  As directed      Discharge instructions    Complete by:  As directed   Total Knee Replacement Care After Refer to this sheet in the next few weeks. These discharge instructions provide you with general information on caring for yourself after you leave the hospital. Your caregiver may also give you specific instructions. Your treatment has been planned according to the most current medical practices available, but unavoidable complications sometimes occur. If you have any problems or questions after discharge, please call your caregiver. Regaining a near full range of motion of your knee within the first 3 to 6 weeks after surgery is critical. HOME CARE INSTRUCTIONS  You may resume a normal diet and activities as directed.  Perform exercises as directed.  Place gray foam block, curve side up under heel at all times except when in CPM or when walking.  DO NOT modify, tear, cut, or change in any way the gray foam block. You will receive physical therapy daily  Take showers instead of baths until informed otherwise.  You may shower on Sunday.  Please wash whole leg including wound with soap and water  Change bandages (dressings)daily It is OK to take over-the-counter tylenol in addition to the oxycodone for pain, discomfort, or fever. Oxycodone is VERY constipating.  Please take stool softener twice a day and laxatives daily until bowels are regular Eat a well-balanced diet.  Avoid lifting or driving until you are instructed otherwise.  Make an appointment to see your caregiver for stitches (suture) or staple removal as directed.  If you have been sent home with a continuous passive motion machine (CPM machine), 0-90 degrees 6 hrs a day   2 hrs a shift SEEK MEDICAL CARE IF: You have swelling of your calf or leg.  You develop shortness of breath or chest pain.  You have redness, swelling, or increasing pain in the  wound.  There is pus or any unusual drainage coming from the surgical site.  You notice a bad smell coming from the surgical site or dressing.  The surgical site breaks open after sutures or staples have been removed.  There is persistent bleeding from the suture or staple line.  You are getting worse or are not improving.  You have any other questions or concerns.  SEEK IMMEDIATE MEDICAL CARE IF:  You have a fever.  You develop a rash.  You have difficulty breathing.  You develop any reaction or side effects to medicines given.  Your knee motion is decreasing rather than improving.  MAKE SURE YOU:  Understand these instructions.  Will watch your condition.  Will get help right away if you are not doing well or get worse.     Do not put a pillow under the knee. Place it under the heel.    Complete by:  As directed   Place gray foam block, curve side up under heel at all times except when in CPM or when walking.  DO NOT modify, tear, cut, or change in any way the gray foam block.     Increase activity slowly as tolerated    Complete by:  As directed  TED hose    Complete by:  As directed   Use stockings (TED hose) for 2 weeks on both leg(s).  You may remove them at night for sleeping.           Follow-up Information   Follow up with Nilda Simmer, MD On 09/23/2013. (appt time 2:15 pm  For suture removal)    Specialty:  Orthopedic Surgery   Contact information:   [address] [place] [address] [phone]        Signed: Pascal Lux 09/10/2013, 8:51 AM

## 2013-09-10 NOTE — Evaluation (Signed)
Occupational Therapy Evaluation Patient Details Name: Theresa Reese MRN: [id number] DOB: December 05, 1950 Today's Date: 09/10/2013    History of Present Illness 63 y.o. female s/p left TKA. Hx of hypothyroidism.   Clinical Impression   Pt admitted with the above diagnoses and presents with below problem list. Pt will benefit from continued acute OT to address the below listed deficits and maximize independence with basic ADLs prior to d/c home with daughter. PTA pt was independent with ADLs. Pt is currently at min guard level for most ADLs. Daughter will provide 24 supervision/assistance at d/c. Discussed AE recommendations and tips for safe completion of ADLs.     Follow Up Recommendations  Supervision/Assistance - 24 hour;No OT follow up    Equipment Recommendations  None recommended by OT;Other (comment) (pt has recommended DME)    Recommendations for Other Services       Precautions / Restrictions Precautions Precautions: Knee Required Braces or Orthoses: Knee Immobilizer - Left Restrictions Weight Bearing Restrictions: Yes LLE Weight Bearing: Weight bearing as tolerated      Mobility Bed Mobility Overal bed mobility: Needs Assistance Bed Mobility: Sit to Supine     Supine to sit: Min guard     General bed mobility comments: pt used BUE to lift leg across bed for positioning  Transfers Overall transfer level: Needs assistance Equipment used: Rolling walker (2 wheeled) Transfers: Sit to/from Stand Sit to Stand: Supervision         General transfer comment: good technique    Balance Overall balance assessment: Needs assistance Sitting-balance support: No upper extremity supported;Feet supported Sitting balance-Leahy Scale: Fair     Standing balance support: Bilateral upper extremity supported;During functional activity Standing balance-Leahy Scale: Poor                              ADL Overall ADL's : Needs assistance/impaired Eating/Feeding:  Set up;Sitting   Grooming: Set up;Sitting;Standing   Upper Body Bathing: Supervision/ safety;Sitting   Lower Body Bathing: Min guard;Sit to/from stand   Upper Body Dressing : Set up;Sitting   Lower Body Dressing: Min guard;With adaptive equipment;Sit to/from stand   Toilet Transfer: Min guard;Ambulation;RW (3n1 over toilet)   Toileting- Clothing Manipulation and Hygiene: Min guard;Sit to/from stand   Tub/ Shower Transfer: Min guard;Ambulation;3 in 1;Rolling walker   Functional mobility during ADLs: Min guard;Rolling walker General ADL Comments: Educated on techniques and AE for safe completion of ADLs with knee precautions. Discussed home setup and which bathroom to use at residence for walker accessibility.     Vision                     Perception     Praxis      Pertinent Vitals/Pain Mild pain LLE     Hand Dominance Right   Extremity/Trunk Assessment Upper Extremity Assessment Upper Extremity Assessment: Overall WFL for tasks assessed   Lower Extremity Assessment Lower Extremity Assessment: Defer to PT evaluation       Communication Communication Communication: No difficulties   Cognition Arousal/Alertness: Awake/alert Behavior During Therapy: WFL for tasks assessed/performed Overall Cognitive Status: Within Functional Limits for tasks assessed                     General Comments       Exercises       Shoulder Instructions      Home Living Family/patient expects to be discharged to:: Private residence Living  Arrangements: Children Available Help at Discharge: Family;Available 24 hours/day Type of Home: House Home Access: Stairs to enter Entergy CorporationEntrance Stairs-Number of Steps: 3 Entrance Stairs-Rails: Right;Left;None Home Layout: One level     Bathroom Shower/Tub: Chief Strategy OfficerTub/shower unit   Bathroom Toilet: Standard Bathroom Accessibility: Yes How Accessible: Accessible via walker Home Equipment: Walker - 2 wheels;Bedside commode    Additional Comments: discussed using the bathroom that has walker accessiblity for home setup.      Prior Functioning/Environment Level of Independence: Independent             OT Diagnosis: Acute pain   OT Problem List: Decreased strength;Decreased range of motion;Decreased activity tolerance;Impaired balance (sitting and/or standing);Decreased knowledge of use of DME or AE;Decreased knowledge of precautions;Pain   OT Treatment/Interventions: Self-care/ADL training;Therapeutic exercise;DME and/or AE instruction;Therapeutic activities;Patient/family education;Balance training    OT Goals(Current goals can be found in the care plan section) Acute Rehab OT Goals Patient Stated Goal: get back to walking better OT Goal Formulation: With patient Time For Goal Achievement: 09/17/13 Potential to Achieve Goals: Good ADL Goals Pt Will Perform Grooming: with modified independence;standing Pt Will Perform Lower Body Bathing: with modified independence;with adaptive equipment;sitting/lateral leans Pt Will Perform Lower Body Dressing: with modified independence;with adaptive equipment;sit to/from stand Pt Will Transfer to Toilet: with modified independence;ambulating (3n1 over toilet) Pt Will Perform Toileting - Clothing Manipulation and hygiene: with adaptive equipment;sit to/from stand;with modified independence Pt Will Perform Tub/Shower Transfer: with modified independence;ambulating;3 in 1;rolling walker  OT Frequency: Min 2X/week   Barriers to D/C:            Co-evaluation              End of Session Equipment Utilized During Treatment: Gait belt;Rolling walker;Left knee immobilizer CPM Left Knee CPM Left Knee: On Left Knee Flexion (Degrees): 53 Left Knee Extension (Degrees): 0 Additional Comments: on at 14:12  Activity Tolerance: Patient tolerated treatment well Patient left: in bed;in CPM;with call bell/phone within reach   Time: 1355-1418 OT Time Calculation  (min): 23 min Charges:  OT General Charges $OT Visit: 1 Procedure OT Evaluation $Initial OT Evaluation Tier I: 1 Procedure OT Treatments $Self Care/Home Management : 8-22 mins G-Codes:    Pilar GrammesMathews, Kathryn H 09/10/2013, 2:43 PM

## 2013-09-30 ENCOUNTER — Ambulatory Visit: Payer: BC Managed Care – PPO | Attending: Orthopedic Surgery | Admitting: Physical Therapy

## 2013-09-30 DIAGNOSIS — Z96659 Presence of unspecified artificial knee joint: Secondary | ICD-10-CM | POA: Insufficient documentation

## 2013-09-30 DIAGNOSIS — IMO0001 Reserved for inherently not codable concepts without codable children: Secondary | ICD-10-CM | POA: Diagnosis not present

## 2013-09-30 DIAGNOSIS — R5381 Other malaise: Secondary | ICD-10-CM | POA: Insufficient documentation

## 2013-09-30 DIAGNOSIS — M25669 Stiffness of unspecified knee, not elsewhere classified: Secondary | ICD-10-CM | POA: Insufficient documentation

## 2013-09-30 DIAGNOSIS — Z9889 Other specified postprocedural states: Secondary | ICD-10-CM | POA: Insufficient documentation

## 2013-09-30 DIAGNOSIS — M25569 Pain in unspecified knee: Secondary | ICD-10-CM | POA: Insufficient documentation

## 2013-10-01 ENCOUNTER — Ambulatory Visit: Payer: BC Managed Care – PPO | Admitting: Physical Therapy

## 2013-10-01 DIAGNOSIS — IMO0001 Reserved for inherently not codable concepts without codable children: Secondary | ICD-10-CM | POA: Diagnosis not present

## 2013-10-03 ENCOUNTER — Ambulatory Visit: Payer: BC Managed Care – PPO | Admitting: Physical Therapy

## 2013-10-03 DIAGNOSIS — IMO0001 Reserved for inherently not codable concepts without codable children: Secondary | ICD-10-CM | POA: Diagnosis not present

## 2013-10-07 ENCOUNTER — Encounter: Payer: BC Managed Care – PPO | Admitting: Physical Therapy

## 2013-10-08 ENCOUNTER — Ambulatory Visit: Payer: BC Managed Care – PPO | Admitting: Physical Therapy

## 2013-10-08 DIAGNOSIS — IMO0001 Reserved for inherently not codable concepts without codable children: Secondary | ICD-10-CM | POA: Diagnosis not present

## 2013-10-10 ENCOUNTER — Ambulatory Visit: Payer: BC Managed Care – PPO | Admitting: Physical Therapy

## 2013-10-10 DIAGNOSIS — IMO0001 Reserved for inherently not codable concepts without codable children: Secondary | ICD-10-CM | POA: Diagnosis not present

## 2013-10-14 ENCOUNTER — Encounter: Payer: BC Managed Care – PPO | Admitting: Physical Therapy

## 2013-10-15 ENCOUNTER — Ambulatory Visit: Payer: BC Managed Care – PPO | Admitting: Physical Therapy

## 2013-10-15 DIAGNOSIS — IMO0001 Reserved for inherently not codable concepts without codable children: Secondary | ICD-10-CM | POA: Diagnosis not present

## 2013-10-17 ENCOUNTER — Ambulatory Visit: Payer: BC Managed Care – PPO | Admitting: Physical Therapy

## 2013-10-17 DIAGNOSIS — IMO0001 Reserved for inherently not codable concepts without codable children: Secondary | ICD-10-CM | POA: Diagnosis not present

## 2013-10-21 ENCOUNTER — Ambulatory Visit: Payer: BC Managed Care – PPO | Attending: Orthopedic Surgery | Admitting: Physical Therapy

## 2013-10-21 DIAGNOSIS — M25569 Pain in unspecified knee: Secondary | ICD-10-CM | POA: Insufficient documentation

## 2013-10-21 DIAGNOSIS — R269 Unspecified abnormalities of gait and mobility: Secondary | ICD-10-CM | POA: Insufficient documentation

## 2013-10-21 DIAGNOSIS — M25669 Stiffness of unspecified knee, not elsewhere classified: Secondary | ICD-10-CM | POA: Insufficient documentation

## 2013-10-21 DIAGNOSIS — IMO0001 Reserved for inherently not codable concepts without codable children: Secondary | ICD-10-CM | POA: Insufficient documentation

## 2013-10-22 ENCOUNTER — Ambulatory Visit: Payer: BC Managed Care – PPO | Admitting: *Deleted

## 2013-10-22 DIAGNOSIS — IMO0001 Reserved for inherently not codable concepts without codable children: Secondary | ICD-10-CM | POA: Diagnosis not present

## 2013-10-24 ENCOUNTER — Ambulatory Visit: Payer: BC Managed Care – PPO | Admitting: *Deleted

## 2013-10-24 DIAGNOSIS — IMO0001 Reserved for inherently not codable concepts without codable children: Secondary | ICD-10-CM | POA: Diagnosis not present

## 2013-10-28 ENCOUNTER — Ambulatory Visit: Payer: BC Managed Care – PPO | Admitting: Physical Therapy

## 2013-10-28 DIAGNOSIS — IMO0001 Reserved for inherently not codable concepts without codable children: Secondary | ICD-10-CM | POA: Diagnosis not present

## 2013-10-29 ENCOUNTER — Ambulatory Visit: Payer: BC Managed Care – PPO | Admitting: Physical Therapy

## 2013-10-29 DIAGNOSIS — IMO0001 Reserved for inherently not codable concepts without codable children: Secondary | ICD-10-CM | POA: Diagnosis not present

## 2013-10-31 ENCOUNTER — Ambulatory Visit: Payer: BC Managed Care – PPO | Admitting: Physical Therapy

## 2013-10-31 DIAGNOSIS — IMO0001 Reserved for inherently not codable concepts without codable children: Secondary | ICD-10-CM | POA: Diagnosis not present

## 2013-11-04 ENCOUNTER — Ambulatory Visit: Payer: BC Managed Care – PPO | Admitting: Physical Therapy

## 2013-11-04 DIAGNOSIS — IMO0001 Reserved for inherently not codable concepts without codable children: Secondary | ICD-10-CM | POA: Diagnosis not present

## 2013-11-05 ENCOUNTER — Ambulatory Visit: Payer: BC Managed Care – PPO | Admitting: Physical Therapy

## 2013-11-05 DIAGNOSIS — IMO0001 Reserved for inherently not codable concepts without codable children: Secondary | ICD-10-CM | POA: Diagnosis not present

## 2013-11-07 ENCOUNTER — Ambulatory Visit: Payer: BC Managed Care – PPO | Admitting: Physical Therapy

## 2013-11-07 DIAGNOSIS — IMO0001 Reserved for inherently not codable concepts without codable children: Secondary | ICD-10-CM | POA: Diagnosis not present

## 2013-11-12 ENCOUNTER — Ambulatory Visit: Payer: BC Managed Care – PPO | Admitting: *Deleted

## 2013-11-12 DIAGNOSIS — IMO0001 Reserved for inherently not codable concepts without codable children: Secondary | ICD-10-CM | POA: Diagnosis not present

## 2013-11-14 ENCOUNTER — Ambulatory Visit: Payer: BC Managed Care – PPO | Admitting: *Deleted

## 2013-11-14 DIAGNOSIS — IMO0001 Reserved for inherently not codable concepts without codable children: Secondary | ICD-10-CM | POA: Diagnosis not present

## 2013-11-19 ENCOUNTER — Ambulatory Visit: Payer: BC Managed Care – PPO | Attending: Orthopedic Surgery | Admitting: Physical Therapy

## 2013-11-19 DIAGNOSIS — IMO0001 Reserved for inherently not codable concepts without codable children: Secondary | ICD-10-CM | POA: Diagnosis present

## 2013-11-19 DIAGNOSIS — R269 Unspecified abnormalities of gait and mobility: Secondary | ICD-10-CM | POA: Insufficient documentation

## 2013-11-19 DIAGNOSIS — M25669 Stiffness of unspecified knee, not elsewhere classified: Secondary | ICD-10-CM | POA: Diagnosis not present

## 2013-11-19 DIAGNOSIS — M25569 Pain in unspecified knee: Secondary | ICD-10-CM | POA: Diagnosis not present

## 2013-11-21 ENCOUNTER — Ambulatory Visit: Payer: BC Managed Care – PPO | Admitting: Physical Therapy

## 2013-11-21 DIAGNOSIS — IMO0001 Reserved for inherently not codable concepts without codable children: Secondary | ICD-10-CM | POA: Diagnosis not present

## 2013-11-26 ENCOUNTER — Ambulatory Visit: Payer: BC Managed Care – PPO | Admitting: Physical Therapy

## 2013-11-26 DIAGNOSIS — IMO0001 Reserved for inherently not codable concepts without codable children: Secondary | ICD-10-CM | POA: Diagnosis not present

## 2013-11-28 ENCOUNTER — Ambulatory Visit: Payer: BC Managed Care – PPO | Admitting: Physical Therapy

## 2013-11-28 DIAGNOSIS — IMO0001 Reserved for inherently not codable concepts without codable children: Secondary | ICD-10-CM | POA: Diagnosis not present

## 2013-12-03 ENCOUNTER — Ambulatory Visit: Payer: BC Managed Care – PPO | Admitting: Physical Therapy

## 2013-12-03 DIAGNOSIS — IMO0001 Reserved for inherently not codable concepts without codable children: Secondary | ICD-10-CM | POA: Diagnosis not present

## 2013-12-05 ENCOUNTER — Ambulatory Visit: Payer: BC Managed Care – PPO | Admitting: Physical Therapy

## 2013-12-05 DIAGNOSIS — IMO0001 Reserved for inherently not codable concepts without codable children: Secondary | ICD-10-CM | POA: Diagnosis not present

## 2013-12-10 ENCOUNTER — Ambulatory Visit: Payer: BC Managed Care – PPO | Admitting: Physical Therapy

## 2013-12-10 DIAGNOSIS — IMO0001 Reserved for inherently not codable concepts without codable children: Secondary | ICD-10-CM | POA: Diagnosis not present

## 2013-12-12 ENCOUNTER — Ambulatory Visit: Payer: BC Managed Care – PPO | Admitting: Physical Therapy

## 2013-12-12 DIAGNOSIS — IMO0001 Reserved for inherently not codable concepts without codable children: Secondary | ICD-10-CM | POA: Diagnosis not present

## 2013-12-17 ENCOUNTER — Ambulatory Visit: Payer: BC Managed Care – PPO | Admitting: *Deleted

## 2013-12-17 DIAGNOSIS — IMO0001 Reserved for inherently not codable concepts without codable children: Secondary | ICD-10-CM | POA: Diagnosis not present

## 2013-12-19 ENCOUNTER — Ambulatory Visit: Payer: BC Managed Care – PPO | Attending: Orthopedic Surgery | Admitting: *Deleted

## 2013-12-19 DIAGNOSIS — M25562 Pain in left knee: Secondary | ICD-10-CM | POA: Insufficient documentation

## 2013-12-19 DIAGNOSIS — R5381 Other malaise: Secondary | ICD-10-CM | POA: Insufficient documentation

## 2013-12-19 DIAGNOSIS — Z9889 Other specified postprocedural states: Secondary | ICD-10-CM | POA: Insufficient documentation

## 2013-12-19 DIAGNOSIS — Z96652 Presence of left artificial knee joint: Secondary | ICD-10-CM | POA: Diagnosis not present

## 2013-12-19 DIAGNOSIS — M25662 Stiffness of left knee, not elsewhere classified: Secondary | ICD-10-CM | POA: Diagnosis not present

## 2013-12-24 ENCOUNTER — Ambulatory Visit: Payer: BC Managed Care – PPO | Admitting: Physical Therapy

## 2013-12-24 DIAGNOSIS — M25562 Pain in left knee: Secondary | ICD-10-CM | POA: Diagnosis not present

## 2013-12-31 ENCOUNTER — Ambulatory Visit: Payer: BC Managed Care – PPO | Admitting: Physical Therapy

## 2013-12-31 DIAGNOSIS — M25562 Pain in left knee: Secondary | ICD-10-CM | POA: Diagnosis not present

## 2014-01-07 ENCOUNTER — Ambulatory Visit: Payer: BC Managed Care – PPO | Admitting: Physical Therapy

## 2014-01-07 DIAGNOSIS — M25562 Pain in left knee: Secondary | ICD-10-CM | POA: Diagnosis not present

## 2014-01-14 ENCOUNTER — Ambulatory Visit: Payer: BC Managed Care – PPO | Admitting: Physical Therapy

## 2014-01-14 DIAGNOSIS — M25562 Pain in left knee: Secondary | ICD-10-CM | POA: Diagnosis not present

## 2014-01-21 ENCOUNTER — Encounter: Payer: BC Managed Care – PPO | Admitting: Physical Therapy

## 2014-01-23 ENCOUNTER — Ambulatory Visit: Payer: BC Managed Care – PPO | Attending: Orthopedic Surgery | Admitting: Physical Therapy

## 2014-01-23 DIAGNOSIS — Z96652 Presence of left artificial knee joint: Secondary | ICD-10-CM | POA: Insufficient documentation

## 2014-01-23 DIAGNOSIS — R5381 Other malaise: Secondary | ICD-10-CM | POA: Diagnosis not present

## 2014-01-23 DIAGNOSIS — M25562 Pain in left knee: Secondary | ICD-10-CM | POA: Diagnosis not present

## 2014-01-23 DIAGNOSIS — M25662 Stiffness of left knee, not elsewhere classified: Secondary | ICD-10-CM | POA: Diagnosis not present

## 2014-01-23 DIAGNOSIS — Z9889 Other specified postprocedural states: Secondary | ICD-10-CM | POA: Diagnosis not present

## 2014-01-28 ENCOUNTER — Ambulatory Visit: Payer: BC Managed Care – PPO | Admitting: Physical Therapy

## 2014-01-28 DIAGNOSIS — M25562 Pain in left knee: Secondary | ICD-10-CM | POA: Diagnosis not present

## 2014-02-04 ENCOUNTER — Ambulatory Visit: Payer: BC Managed Care – PPO | Admitting: Physical Therapy

## 2014-02-04 DIAGNOSIS — M25562 Pain in left knee: Secondary | ICD-10-CM | POA: Diagnosis not present

## 2014-02-11 ENCOUNTER — Ambulatory Visit: Payer: BC Managed Care – PPO | Admitting: Physical Therapy

## 2014-02-11 DIAGNOSIS — M25562 Pain in left knee: Secondary | ICD-10-CM | POA: Diagnosis not present

## 2014-02-18 ENCOUNTER — Encounter: Payer: BC Managed Care – PPO | Admitting: Physical Therapy

## 2016-03-16 ENCOUNTER — Other Ambulatory Visit: Payer: Self-pay | Admitting: Obstetrics and Gynecology

## 2016-03-16 DIAGNOSIS — R928 Other abnormal and inconclusive findings on diagnostic imaging of breast: Secondary | ICD-10-CM

## 2016-03-24 ENCOUNTER — Ambulatory Visit
Admission: RE | Admit: 2016-03-24 | Discharge: 2016-03-24 | Disposition: A | Discharge status: Home / Self Care | Payer: BC Managed Care – PPO | Source: Ambulatory Visit | Attending: Obstetrics and Gynecology | Admitting: Obstetrics and Gynecology

## 2016-03-24 DIAGNOSIS — R928 Other abnormal and inconclusive findings on diagnostic imaging of breast: Secondary | ICD-10-CM

## 2016-05-14 IMAGING — CR DG CHEST 2V
2 series · 2 of 2 positions shown · non-contrast
Comparison: 12/26/2005

CLINICAL DATA: Preop for knee replacement

EXAM:
CHEST  2 VIEW

[w chest pa]
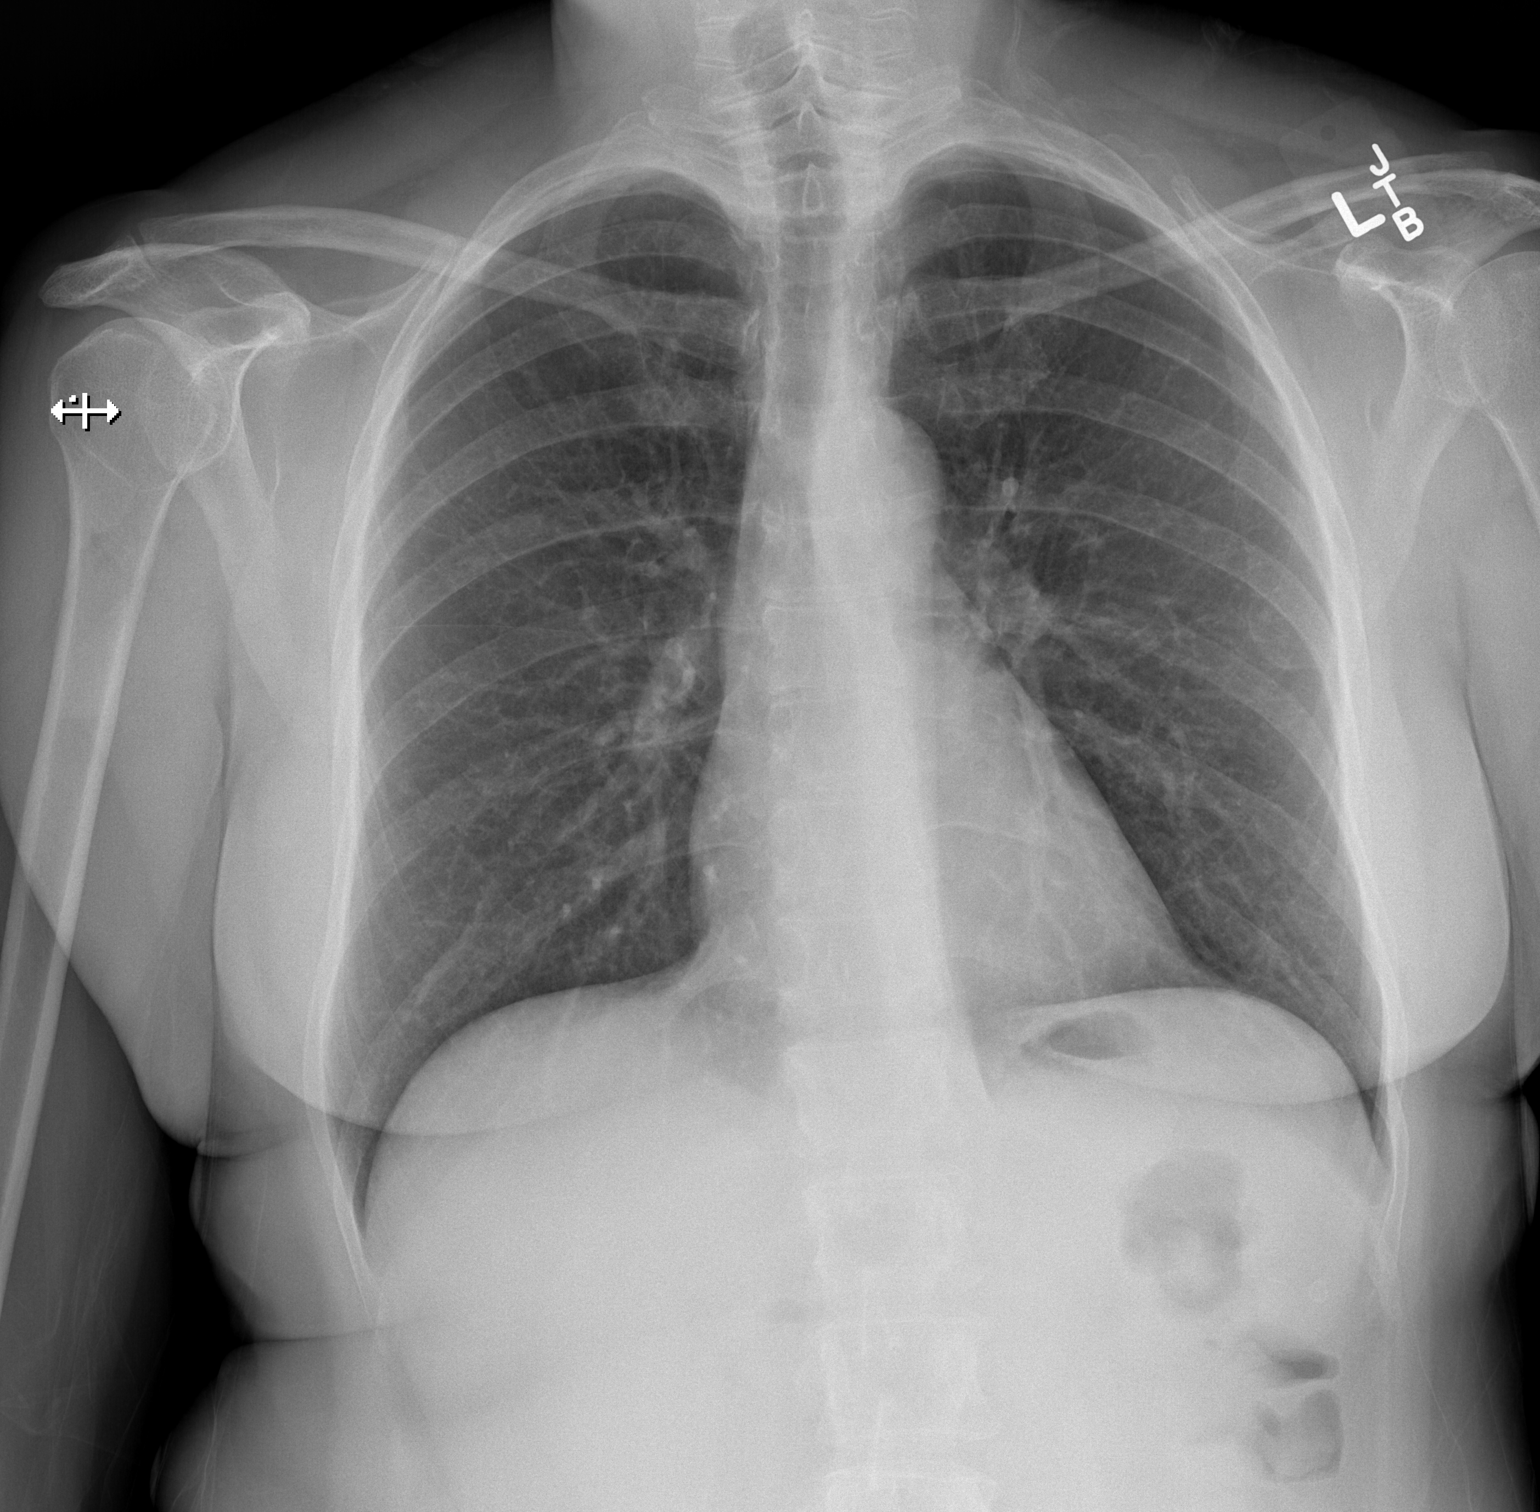

[w chest lat]
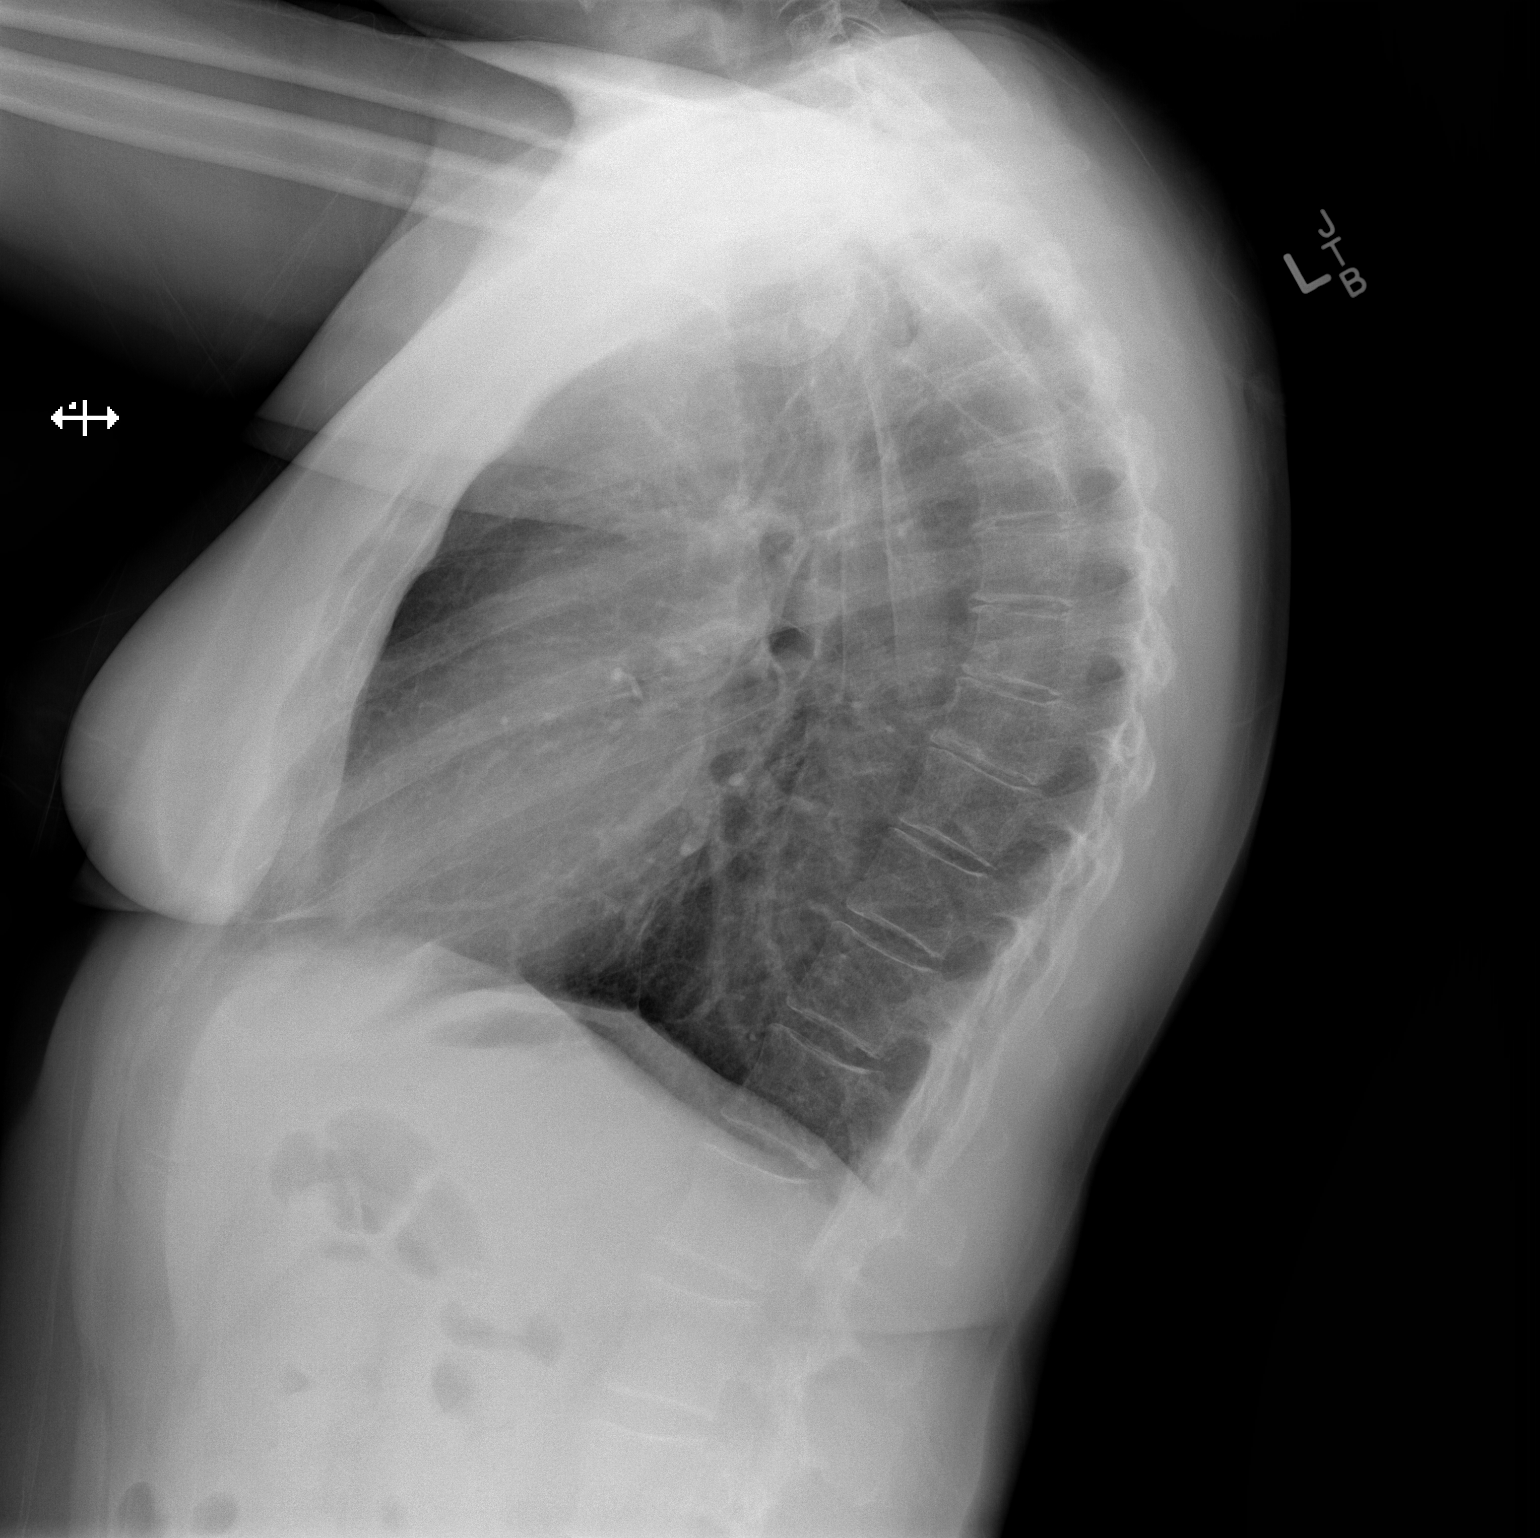

[2 of 2 positions shown; findings below may reference images not displayed]

FINDINGS: Cardiomediastinal silhouette is stable. No acute infiltrate or
pleural effusion. No pulmonary edema. Mild degenerative changes mid
thoracic spine.
IMPRESSION: No active cardiopulmonary disease.

## 2016-09-07 ENCOUNTER — Other Ambulatory Visit: Payer: Self-pay | Admitting: Obstetrics and Gynecology

## 2016-09-07 DIAGNOSIS — N6489 Other specified disorders of breast: Secondary | ICD-10-CM

## 2016-09-26 ENCOUNTER — Other Ambulatory Visit: Payer: BC Managed Care – PPO

## 2016-09-29 ENCOUNTER — Ambulatory Visit
Admission: RE | Admit: 2016-09-29 | Discharge: 2016-09-29 | Disposition: A | Discharge status: Home / Self Care | Payer: BC Managed Care – PPO | Source: Ambulatory Visit | Attending: Obstetrics and Gynecology | Admitting: Obstetrics and Gynecology

## 2016-09-29 DIAGNOSIS — N6489 Other specified disorders of breast: Secondary | ICD-10-CM

## 2018-09-04 ENCOUNTER — Other Ambulatory Visit: Payer: Self-pay | Admitting: Family Medicine

## 2018-09-04 DIAGNOSIS — Z1231 Encounter for screening mammogram for malignant neoplasm of breast: Secondary | ICD-10-CM

## 2018-09-04 DIAGNOSIS — M818 Other osteoporosis without current pathological fracture: Secondary | ICD-10-CM

## 2018-11-01 ENCOUNTER — Ambulatory Visit
Admission: RE | Admit: 2018-11-01 | Discharge: 2018-11-01 | Disposition: A | Discharge status: Home / Self Care | Payer: Medicare Other | Source: Ambulatory Visit | Attending: Family Medicine | Admitting: Family Medicine

## 2018-11-01 ENCOUNTER — Other Ambulatory Visit: Payer: Self-pay

## 2018-11-01 ENCOUNTER — Ambulatory Visit
Admission: RE | Admit: 2018-11-01 | Discharge: 2018-11-01 | Disposition: A | Discharge status: Home / Self Care | Payer: BC Managed Care – PPO | Source: Ambulatory Visit | Attending: Family Medicine | Admitting: Family Medicine

## 2018-11-01 DIAGNOSIS — Z1231 Encounter for screening mammogram for malignant neoplasm of breast: Secondary | ICD-10-CM

## 2018-11-01 DIAGNOSIS — M818 Other osteoporosis without current pathological fracture: Secondary | ICD-10-CM

## 2018-11-05 ENCOUNTER — Other Ambulatory Visit: Payer: Self-pay | Admitting: Family Medicine

## 2018-11-05 DIAGNOSIS — R928 Other abnormal and inconclusive findings on diagnostic imaging of breast: Secondary | ICD-10-CM

## 2018-11-07 ENCOUNTER — Other Ambulatory Visit: Payer: Self-pay

## 2018-11-07 ENCOUNTER — Ambulatory Visit
Admission: RE | Admit: 2018-11-07 | Discharge: 2018-11-07 | Disposition: A | Discharge status: Home / Self Care | Payer: Medicare Other | Source: Ambulatory Visit | Attending: Family Medicine | Admitting: Family Medicine

## 2018-11-07 DIAGNOSIS — R928 Other abnormal and inconclusive findings on diagnostic imaging of breast: Secondary | ICD-10-CM

## 2018-12-11 ENCOUNTER — Other Ambulatory Visit: Payer: Self-pay

## 2018-12-11 ENCOUNTER — Ambulatory Visit: Payer: Medicare Other | Attending: Family Medicine | Admitting: Physical Therapy

## 2018-12-11 ENCOUNTER — Encounter: Payer: Self-pay | Admitting: Physical Therapy

## 2018-12-11 DIAGNOSIS — M5442 Lumbago with sciatica, left side: Secondary | ICD-10-CM | POA: Diagnosis not present

## 2018-12-11 DIAGNOSIS — R293 Abnormal posture: Secondary | ICD-10-CM | POA: Diagnosis present

## 2018-12-11 DIAGNOSIS — M6281 Muscle weakness (generalized): Secondary | ICD-10-CM | POA: Insufficient documentation

## 2018-12-11 NOTE — Therapy (Signed)
[facility] [address] [place], [place], [address] Phone: [phone]   Fax:  [phone]  Physical Therapy Evaluation  Patient Details  Name: Theresa Reese MRN: [id number] Date of Birth: 1950/09/13 Referring Provider (PT): Gweneth Dimitri, MD   Encounter Date: 12/11/2018  PT End of Session - 12/11/18 1726    Visit Number  1    Number of Visits  8    Date for PT Re-Evaluation  01/15/19    Authorization Type  FOTO; progress note every 10th visit; KX modifier at 15th visit    PT Start Time  1515    PT Stop Time  1600    PT Time Calculation (min)  45 min    Activity Tolerance  Patient tolerated treatment well    Behavior During Therapy  [facility] for tasks assessed/performed       Past Medical History:  Diagnosis Date  . Anxiety   . Depression   . Depression with anxiety   . Hypercholesteremia   . Hypothyroid   . Insomnia   . Left knee DJD   . Lymphocytic colitis     Past Surgical History:  Procedure Laterality Date  . CESAREAN SECTION  1987  . CESAREAN SECTION  1990  . COLONOSCOPY    . KNEE ARTHROSCOPY W/ MENISCECTOMY Left 2014   Dr Shelle Iron  . low back surgery  1997   Dr Jule Ser  . low back surgery  1994   Dr Elesa Hacker  . RETINAL DETACHMENT SURGERY Right 2013  . 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   Subjective Assessment - 12/11/18 1720    Subjective  COVID-19 screening performed upon arrival.Patient arrives to physical therapy with low back pain that began after helping a gentleman from off the floor. Patient reports she felt fine during the activity, but pain began the day after. She reports taking a round of prednisone that helped her pain significantly. Patient reports ability to perform ADLs and home activities with minimal pain but is extra  cautious with bending and lifting. Patient's pain at worst is 5/10 and pain at best is 2/10. Patient's goals are to decrease pain, improve mobility, improve strength, and return to PLOF.    Limitations  Sitting;Lifting;Standing    How long can you sit comfortably?  30 minutes    How long can you stand comfortably?  15-20 minutes    How long can you walk comfortably?  15-20 minutes    Diagnostic tests  n/a    Patient Stated Goals  decrease pain; go back to normal.    Currently in Pain?  Yes    Pain Score  3     Pain Location  Back    Pain Orientation  Left;Lower    Pain Descriptors / Indicators  Sore;Tender;Tightness    Pain Type  Acute pain    Pain Radiating Towards  left lateral knee    Pain Onset  1 to 4 weeks ago    Pain Frequency  Intermittent    Aggravating Factors   bending lifting, sitting or standing too long    Pain Relieving Factors  heat, medicine PRN    Effect of Pain on Daily Activities  minimal, extra cautious with lifting, gets assistance from significant other         [facility]  PT Assessment - 12/11/18 0001      Assessment   Medical Diagnosis  Lumbar radiculopathy    Referring Provider (PT)  Gweneth Dimitri, MD    Onset Date/Surgical Date  --   Beginning of September 2020   Next MD Visit  N/A    Prior Therapy  yes      Precautions   Precautions  None      Restrictions   Weight Bearing Restrictions  No      Balance Screen   Has the patient fallen in the past 6 months  No    Has the patient had a decrease in activity level because of a fear of falling?   No    Is the patient reluctant to leave their home because of a fear of falling?   No      Home Nurse, mental health  Private residence    Living Arrangements  Spouse/significant other      Prior Function   Level of Independence  Independent      Observation/Other Assessments   Focus on Therapeutic Outcomes (FOTO)   to be completed next vist      Posture/Postural Control   Posture/Postural  Control  Postural limitations    Postural Limitations  Rounded Shoulders;Forward head;Increased thoracic kyphosis      ROM / Strength   AROM / PROM / Strength  AROM;Strength      AROM   AROM Assessment Site  Lumbar    Lumbar Flexion  6.5" finger tip to floor    Lumbar - Right Side Bend  19" finger tip to floor    Lumbar - Left Side Bend  18.5" finger tip to floor      Strength   Strength Assessment Site  Hip;Knee    Right/Left Hip  Right;Left    Right Hip Flexion  4/5    Right Hip Extension  4-/5    Right Hip ABduction  4-/5    Left Hip Flexion  4-/5    Left Hip Extension  3+/5    Left Hip ABduction  3+/5    Right/Left Knee  Right;Left    Right Knee Flexion  4/5    Right Knee Extension  4+/5    Left Knee Flexion  4/5    Left Knee Extension  4+/5      Palpation   Palpation comment  Tender to palpation to L QL, lower lumbar paraspinals, upper left gluteals, PSIS and lateral left hip and ITB       Special Tests    Special Tests  Lumbar    Lumbar Tests  Slump Test      Slump test   Findings  Positive    Side  Left    Comment  increased discomfort in low back and hamstring      Transfers   Transfers  Independent with all Transfers    Comments  Able to demonstrate proper transfers and log rolling bed mobility to protect spine      Ambulation/Gait   Gait Pattern  Within Functional Limits                Objective measurements completed on examination: See above findings.              PT Education - 12/11/18 1725    Education Details  draw ins, hamstring stretch, sciatic nerve glides, scapular retractions    Person(s) Educated  Patient    Methods  Explanation;Demonstration;Handout  Comprehension  Verbalized understanding;Returned demonstration          PT Long Term Goals - 12/11/18 1726      PT LONG TERM GOAL #1   Title  Patient will be independent with HEP    Time  4    Period  Weeks    Status  New      PT LONG TERM GOAL #2   Title   Patient will demonstrate 4+/5 left LE MMT to improve stability during functional tasks.    Time  4    Period  Weeks    Status  New      PT LONG TERM GOAL #3   Title  Patient will report a centralization of neurological symptoms from lateral knee to low back to reduce nerve irritation.    Time  4    Period  Weeks    Status  New      PT LONG TERM GOAL #4   Title  Patient will demonstrate proper lifting mechanics to protect spine during functional tasks.    Time  4    Period  Weeks    Status  New             Plan - 12/11/18 1753    Clinical Impression Statement  Patient is a 68 year old female who presents to physical therapy with left sided low back pain with neurological symptoms to lateral knee, decreased lumbar AROM, and decreased left LE MMT. Patient noted with increased thoracic kyphosis, forward head, and rounded shoulders. Patient (+) L slump test. Patient and PT reviewed HEP and was educated on plan of care to address goals. Patient reported understanding and agreement. Patient would benefit from skilled physical therapy to address deficits and address patient's goals.    Personal Factors and Comorbidities  Age;Comorbidity 1    Comorbidities  HTN, history of lumbar surgery, history of left TKA    Examination-Activity Limitations  Bend;Lift    Stability/Clinical Decision Making  Stable/Uncomplicated    Clinical Decision Making  Low    Rehab Potential  Good    PT Frequency  2x / week    PT Duration  4 weeks    PT Treatment/Interventions  ADLs/Self Care Home Management;Cryotherapy;Electrical Stimulation;Moist Heat;Gait training;Stair training;Functional mobility training;Therapeutic activities;Therapeutic exercise;Passive range of motion;Neuromuscular re-education;Manual techniques;Patient/family education    PT Next Visit Plan  Nustep; core stabilization, LE strengthening, body mechanics for lifting, modalities PRN for pain relief.    PT Home Exercise Plan  see patient  education    Consulted and Agree with Plan of Care  Patient       Patient will benefit from skilled therapeutic intervention in order to improve the following deficits and impairments:  Pain, Decreased activity tolerance, Decreased range of motion, Decreased strength, Postural dysfunction  Visit Diagnosis: Acute left-sided low back pain with left-sided sciatica  Muscle weakness (generalized)  Abnormal posture     Problem List Patient Active Problem List   Diagnosis Date Noted  . DJD (degenerative joint disease) of knee 09/09/2013  . Insomnia   . Depression with anxiety   . Hypothyroid   . Lymphocytic colitis   . Left knee DJD     [name], PT, DPT 12/11/2018, 5:58 PM  [facility] [address] [place], [place], [address] Phone: [phone]   Fax:  [phone]  Name: Theresa Reese MRN: [id number] Date of Birth: 1951/01/16

## 2018-12-13 ENCOUNTER — Ambulatory Visit: Payer: Medicare Other | Admitting: *Deleted

## 2018-12-13 ENCOUNTER — Other Ambulatory Visit: Payer: Self-pay

## 2018-12-13 DIAGNOSIS — M6281 Muscle weakness (generalized): Secondary | ICD-10-CM

## 2018-12-13 DIAGNOSIS — M5442 Lumbago with sciatica, left side: Secondary | ICD-10-CM

## 2018-12-13 DIAGNOSIS — R293 Abnormal posture: Secondary | ICD-10-CM

## 2018-12-13 NOTE — Therapy (Signed)
[facility] [place], [place], [address] Phone: [phone]   Fax:  [phone]  Physical Therapy Treatment  Patient Details  Name: Theresa Reese MRN: [id number] Date of Birth: 10-19-1950 Referring Provider (PT): Cari Caraway, MD   Encounter Date: 12/13/2018  PT End of Session - 12/13/18 1519    Visit Number  2    Number of Visits  8    Date for PT Re-Evaluation  01/15/19    Authorization Type  FOTO; progress note every 10th visit; KX modifier at 15th visit    PT Start Time  1515    PT Stop Time  1604    PT Time Calculation (min)  49 min       Past Medical History:  Diagnosis Date  . Anxiety   . Depression   . Depression with anxiety   . Hypercholesteremia   . Hypothyroid   . Insomnia   . Left knee DJD   . Lymphocytic colitis     Past Surgical History:  Procedure Laterality Date  . CESAREAN SECTION  1987  . CESAREAN SECTION  1990  . COLONOSCOPY    . KNEE ARTHROSCOPY W/ MENISCECTOMY Left 2014   Dr Tonita Cong  . low back surgery  1997   Dr Rita Ohara  . low back surgery  1994   Dr Rolin Barry  . RETINAL DETACHMENT SURGERY Right 2013  . TOTAL KNEE ARTHROPLASTY Left 09/09/2013   Procedure: LEFT TOTAL KNEE ARTHROPLASTY;  Surgeon: Lorn Junes, MD;  Location: [place];  Service: Orthopedics;  Laterality: Left;    There were no vitals filed for this visit.  Subjective Assessment - 12/13/18 1515    Subjective  COVID-19 screening performed upon arrival. 3-4/10 LBP    Limitations  Sitting;Lifting;Standing    How long can you stand comfortably?  15-20 minutes    How long can you walk comfortably?  15-20 minutes    Patient Stated Goals  decrease pain; go back to normal.    Currently in Pain?  Yes    Pain Score  3     Pain Location  Back    Pain Orientation  Left;Lower    Pain Descriptors / Indicators  Sore    Pain Type  Acute pain    Pain Onset  1 to 4 weeks ago                       [facility]  Treatment/Exercise - 12/13/18 0001      Exercises   Exercises  Lumbar;Knee/Hip      Lumbar Exercises: Supine   Ab Set  10 reps;5 seconds    Bent Knee Raise  10 reps;3 seconds    Bridge  10 reps;3 seconds      Lumbar Exercises: Prone   Straight Leg Raise  10 reps;3 seconds      Modalities   Modalities  Electrical Stimulation;Moist Heat      Moist Heat Therapy   Number Minutes Moist Heat  15 Minutes    Moist Heat Location  Lumbar Spine      Electrical Stimulation   Electrical Stimulation Location  LT LB/Glute    Electrical Stimulation Action  premod    Electrical Stimulation Parameters  80-150hz  x 15 mins    Electrical Stimulation Goals  Pain      Manual Therapy   Manual Therapy  Soft tissue mobilization    Soft tissue mobilization  STW in prone LT LB  paras and glute                  PT Long Term Goals - 12/11/18 1726      PT LONG TERM GOAL #1   Title  Patient will be independent with HEP    Time  4    Period  Weeks    Status  New      PT LONG TERM GOAL #2   Title  Patient will demonstrate 4+/5 left LE MMT to improve stability during functional tasks.    Time  4    Period  Weeks    Status  New      PT LONG TERM GOAL #3   Title  Patient will report a centralization of neurological symptoms from lateral knee to low back to reduce nerve irritation.    Time  4    Period  Weeks    Status  New      PT LONG TERM GOAL #4   Title  Patient will demonstrate proper lifting mechanics to protect spine during functional tasks.    Time  4    Period  Weeks    Status  New            Plan - 12/13/18 1524    Clinical Impression Statement  Pt arrived today doing fairly well with mainly pain on LT side LB and into glute. She was able to perform core activation exs without dificulty for HEP and handout given. She had notable tightness and tenderness LT upper glute during STW. Normal modality response today. Decreased pain after session.    Personal Factors and  Comorbidities  Age;Comorbidity 1    Comorbidities  HTN, history of lumbar surgery, history of left TKA    Examination-Activity Limitations  Bend;Lift    Stability/Clinical Decision Making  Stable/Uncomplicated    Rehab Potential  Good    PT Frequency  2x / week    PT Duration  4 weeks    PT Treatment/Interventions  ADLs/Self Care Home Management;Cryotherapy;Electrical Stimulation;Moist Heat;Gait training;Stair training;Functional mobility training;Therapeutic activities;Therapeutic exercise;Passive range of motion;Neuromuscular re-education;Manual techniques;Patient/family education    PT Next Visit Plan  Nustep; core stabilization, LE strengthening, body mechanics for lifting, modalities PRN for pain relief.    PT Home Exercise Plan  see patient education       Patient will benefit from skilled therapeutic intervention in order to improve the following deficits and impairments:  Pain, Decreased activity tolerance, Decreased range of motion, Decreased strength, Postural dysfunction  Visit Diagnosis: Acute left-sided low back pain with left-sided sciatica  Muscle weakness (generalized)  Abnormal posture     Problem List Patient Active Problem List   Diagnosis Date Noted  . DJD (degenerative joint disease) of knee 09/09/2013  . Insomnia   . Depression with anxiety   . Hypothyroid   . Lymphocytic colitis   . Left knee DJD     RAMSEUR,[name], PTA 12/13/2018, 4:51 PM  [facility] [address] [place], [place], [address] Phone: [phone]   Fax:  [phone]  Name: Theresa Reese MRN: [id number] Date of Birth: 04-05-1950

## 2018-12-18 ENCOUNTER — Other Ambulatory Visit: Payer: Self-pay

## 2018-12-18 ENCOUNTER — Ambulatory Visit: Payer: Medicare Other | Admitting: *Deleted

## 2018-12-18 DIAGNOSIS — R293 Abnormal posture: Secondary | ICD-10-CM

## 2018-12-18 DIAGNOSIS — M6281 Muscle weakness (generalized): Secondary | ICD-10-CM

## 2018-12-18 DIAGNOSIS — M5442 Lumbago with sciatica, left side: Secondary | ICD-10-CM | POA: Diagnosis not present

## 2018-12-18 NOTE — Therapy (Signed)
[facility] [place], [place], [address] Phone: [phone]   Fax:  [phone]  Physical Therapy Treatment  Patient Details  Name: Theresa Reese MRN: [id number] Date of Birth: 02-21-1951 Referring Provider (PT): Cari Caraway, MD   Encounter Date: 12/18/2018  PT End of Session - 12/18/18 1527    Visit Number  3    Number of Visits  8    Date for PT Re-Evaluation  01/15/19    Authorization Type  FOTO; progress note every 10th visit; KX modifier at 15th visit    PT Start Time  1515    PT Stop Time  1602    PT Time Calculation (min)  47 min       Past Medical History:  Diagnosis Date  . Anxiety   . Depression   . Depression with anxiety   . Hypercholesteremia   . Hypothyroid   . Insomnia   . Left knee DJD   . Lymphocytic colitis     Past Surgical History:  Procedure Laterality Date  . CESAREAN SECTION  1987  . CESAREAN SECTION  1990  . COLONOSCOPY    . KNEE ARTHROSCOPY W/ MENISCECTOMY Left 2014   Dr Tonita Cong  . low back surgery  1997   Dr Rita Ohara  . low back surgery  1994   Dr Rolin Barry  . RETINAL DETACHMENT SURGERY Right 2013  . TOTAL KNEE ARTHROPLASTY Left 09/09/2013   Procedure: LEFT TOTAL KNEE ARTHROPLASTY;  Surgeon: Lorn Junes, MD;  Location: [facility];  Service: Orthopedics;  Laterality: Left;    There were no vitals filed for this visit.  Subjective Assessment - 12/18/18 1528    Subjective  COVID-19 screening performed upon arrival. 3/10 LBP. Did good afyter last Rx    Limitations  Sitting;Lifting;Standing    How long can you sit comfortably?  30 minutes    How long can you stand comfortably?  15-20 minutes    How long can you walk comfortably?  15-20 minutes    Diagnostic tests  n/a    Patient Stated Goals  decrease pain; go back to normal.    Currently in Pain?  Yes    Pain Score  3     Pain Location  Back    Pain Orientation  Left;Lower    Pain Descriptors / Indicators  Sore    Pain Type   Acute pain    Pain Onset  1 to 4 weeks ago                       [facility] Adult PT Treatment/Exercise - 12/18/18 0001      Exercises   Exercises  Lumbar;Knee/Hip      Lumbar Exercises: Supine   Ab Set  10 reps;5 seconds    Bent Knee Raise  3 seconds;20 reps    Bridge  3 seconds;20 reps      Lumbar Exercises: Prone   Straight Leg Raise  10 reps;3 seconds      Modalities   Modalities  Electrical Stimulation;Moist Heat      Moist Heat Therapy   Number Minutes Moist Heat  15 Minutes    Moist Heat Location  Lumbar Spine      Electrical Stimulation   Electrical Stimulation Location  LT LB/Glute    Electrical Stimulation Action  premod    Electrical Stimulation Parameters  80-150 hz x 15 mins    Electrical Stimulation Goals  Pain  Manual Therapy   Manual Therapy  Soft tissue mobilization    Soft tissue mobilization  STW in prone LT LB paras and glute                  PT Long Term Goals - 12/11/18 1726      PT LONG TERM GOAL #1   Title  Patient will be independent with HEP    Time  4    Period  Weeks    Status  New      PT LONG TERM GOAL #2   Title  Patient will demonstrate 4+/5 left LE MMT to improve stability during functional tasks.    Time  4    Period  Weeks    Status  New      PT LONG TERM GOAL #3   Title  Patient will report a centralization of neurological symptoms from lateral knee to low back to reduce nerve irritation.    Time  4    Period  Weeks    Status  New      PT LONG TERM GOAL #4   Title  Patient will demonstrate proper lifting mechanics to protect spine during functional tasks.    Time  4    Period  Weeks    Status  New            Plan - 12/18/18 1608    Clinical Impression Statement  Pt arrived today doing better after last Rx with LB 3/10. HEP was reviewed with VC's for technique. STW performed with notable tightness in LT lumbar paras and upper glute. Normal modality response end of session    Personal  Factors and Comorbidities  Age;Comorbidity 1    Comorbidities  HTN, history of lumbar surgery, history of left TKA    Examination-Activity Limitations  Bend;Lift    Stability/Clinical Decision Making  Stable/Uncomplicated    Rehab Potential  Good    PT Frequency  2x / week    PT Duration  4 weeks    PT Treatment/Interventions  ADLs/Self Care Home Management;Cryotherapy;Electrical Stimulation;Moist Heat;Gait training;Stair training;Functional mobility training;Therapeutic activities;Therapeutic exercise;Passive range of motion;Neuromuscular re-education;Manual techniques;Patient/family education    PT Next Visit Plan  Nustep; core stabilization, LE strengthening, body mechanics for lifting, modalities PRN for pain relief.    PT Home Exercise Plan  see patient education       Patient will benefit from skilled therapeutic intervention in order to improve the following deficits and impairments:  Pain, Decreased activity tolerance, Decreased range of motion, Decreased strength, Postural dysfunction  Visit Diagnosis: Acute left-sided low back pain with left-sided sciatica  Muscle weakness (generalized)  Abnormal posture     Problem List Patient Active Problem List   Diagnosis Date Noted  . DJD (degenerative joint disease) of knee 09/09/2013  . Insomnia   . Depression with anxiety   . Hypothyroid   . Lymphocytic colitis   . Left knee DJD     [name],[name], PTA 12/18/2018, 4:13 PM  [facility] [address] [place], [place], [address] Phone: [phone]   Fax:  [phone]  Name: Theresa Reese MRN: [id number] Date of Birth: 1950/11/05

## 2018-12-25 ENCOUNTER — Other Ambulatory Visit: Payer: Self-pay

## 2018-12-25 ENCOUNTER — Ambulatory Visit: Payer: Medicare Other | Attending: Family Medicine | Admitting: Physical Therapy

## 2018-12-25 DIAGNOSIS — R293 Abnormal posture: Secondary | ICD-10-CM | POA: Insufficient documentation

## 2018-12-25 DIAGNOSIS — M5442 Lumbago with sciatica, left side: Secondary | ICD-10-CM | POA: Diagnosis not present

## 2018-12-25 DIAGNOSIS — M6281 Muscle weakness (generalized): Secondary | ICD-10-CM | POA: Diagnosis present

## 2018-12-25 NOTE — Therapy (Signed)
[facility] [address] [place], [place], [address] Phone: [phone]   Fax:  [phone]  Physical Therapy Treatment  Patient Details  Name: Theresa Reese MRN: [id number] Date of Birth: 1950-04-05 Referring Provider (Theresa Reese): Gweneth DimitriWendy Mcneill, MD   Encounter Date: 12/25/2018  Theresa Reese End of Session - 12/25/18 1718    Visit Number  4    Number of Visits  8    Date for Theresa Reese Re-Evaluation  01/15/19    Authorization Type  FOTO; progress note every 10th visit; KX modifier at 15th visit    Theresa Reese Start Time  1515    Theresa Reese Stop Time  1610    Theresa Reese Time Calculation (min)  55 min    Activity Tolerance  Patient tolerated treatment well    Behavior During Therapy  [facility] for tasks assessed/performed       Past Medical History:  Diagnosis Date  . Anxiety   . Depression   . Depression with anxiety   . Hypercholesteremia   . Hypothyroid   . Insomnia   . Left knee DJD   . Lymphocytic colitis     Past Surgical History:  Procedure Laterality Date  . CESAREAN SECTION  1987  . CESAREAN SECTION  1990  . COLONOSCOPY    . KNEE ARTHROSCOPY W/ MENISCECTOMY Left 2014   Dr Shelle IronBeane  . low back surgery  1997   Dr Jule SerNudleman  . low back surgery  1994   Dr Elesa Hackereaton  . RETINAL DETACHMENT SURGERY Right 2013  . TOTAL KNEE ARTHROPLASTY Left 09/09/2013   Procedure: LEFT TOTAL KNEE ARTHROPLASTY;  Surgeon: Nilda Simmerobert A Wainer, MD;  [facility];  Service: Orthopedics;  Laterality: Left;    There were no vitals filed for this visit.  Subjective Assessment - 12/25/18 1727    Subjective  COVID-19 screening performed upon arrival. Reports feeling very good today and is satisfied with progress made in Theresa Reese.    Limitations  Sitting;Lifting;Standing    How long can you sit comfortably?  30 minutes    How long can you stand comfortably?  15-20 minutes    How long can you walk comfortably?  15-20 minutes    Diagnostic tests  n/a    Patient Stated Goals  decrease pain; go back to normal.     Currently in Pain?  Yes   "twinge of pain"   Pain Score  1     Pain Location  Back    Pain Orientation  Left;Lower    Pain Descriptors / Indicators  Sore    Pain Type  Acute pain    Pain Onset  1 to 4 weeks ago    Pain Frequency  Intermittent         [facility] Theresa Reese Assessment - 12/25/18 0001      Assessment   Medical Diagnosis  Lumbar radiculopathy    Referring Provider (Theresa Reese)  Gweneth DimitriWendy Mcneill, MD    Next MD Visit  N/A    Prior Therapy  yes      Precautions   Precautions  None                   [facility] Adult Theresa Reese Treatment/Exercise - 12/25/18 0001      Exercises   Exercises  Lumbar;Knee/Hip      Lumbar Exercises: Aerobic   Nustep  Level 2 x10 mins      Lumbar Exercises: Supine   Ab Set  10 reps;5 seconds    Bridge  3  seconds;20 reps      Lumbar Exercises: Prone   Straight Leg Raise  10 reps;3 seconds      Modalities   Modalities  Electrical Stimulation;Moist Heat      Moist Heat Therapy   Number Minutes Moist Heat  15 Minutes    Moist Heat Location  Lumbar Spine      Electrical Stimulation   Electrical Stimulation Location  bilateral low back and glutes    Electrical Stimulation Action  IFC     Electrical Stimulation Parameters  80-150 hz x15 mins    Electrical Stimulation Goals  Pain      Manual Therapy   Manual Therapy  Soft tissue mobilization    Soft tissue mobilization  STW in prone bilateral LB paras and glute                  Theresa Reese Long Term Goals - 12/11/18 1726      Theresa Reese LONG TERM GOAL #1   Title  Patient will be independent with HEP    Time  4    Period  Weeks    Status  New      Theresa Reese LONG TERM GOAL #2   Title  Patient will demonstrate 4+/5 left LE MMT to improve stability during functional tasks.    Time  4    Period  Weeks    Status  New      Theresa Reese LONG TERM GOAL #3   Title  Patient will report a centralization of neurological symptoms from lateral knee to low back to reduce nerve irritation.    Time  4    Period  Weeks     Status  New      Theresa Reese LONG TERM GOAL #4   Title  Patient will demonstrate proper lifting mechanics to protect spine during functional tasks.    Time  4    Period  Weeks    Status  New            Plan - 12/25/18 1718    Clinical Impression Statement  Patient responded well with progression of exercises. Patient was able to demonstrate strong carryover of cuing from last visit. She demonstrates good form with all exercises. Patient noted with ongoing muscle tone in paraspinals but reported a significant improvement since start of therapy. Normal response to modalities upon removal.    Comorbidities  HTN, history of lumbar surgery, history of left TKA    Examination-Activity Limitations  Bend;Lift    Stability/Clinical Decision Making  Stable/Uncomplicated    Clinical Decision Making  Low    Rehab Potential  Good    Theresa Reese Frequency  2x / week    Theresa Reese Duration  4 weeks    Theresa Reese Treatment/Interventions  ADLs/Self Care Home Management;Cryotherapy;Electrical Stimulation;Moist Heat;Gait training;Stair training;Functional mobility training;Therapeutic activities;Therapeutic exercise;Passive range of motion;Neuromuscular re-education;Manual techniques;Patient/family education    Theresa Reese Next Visit Plan  Assess goals next visit; Nustep; core stabilization, LE strengthening, body mechanics for lifting, modalities PRN for pain relief.    Consulted and Agree with Plan of Care  Patient       Patient will benefit from skilled therapeutic intervention in order to improve the following deficits and impairments:  Pain, Decreased activity tolerance, Decreased range of motion, Decreased strength, Postural dysfunction  Visit Diagnosis: Acute left-sided low back pain with left-sided sciatica  Muscle weakness (generalized)  Abnormal posture     Problem List Patient Active Problem List   Diagnosis Date Noted  .  DJD (degenerative joint disease) of knee 09/09/2013  . Insomnia   . Depression with anxiety   .  Hypothyroid   . Lymphocytic colitis   . Left knee DJD     Theresa Reese, Theresa Reese, Theresa Reese 12/25/2018, 5:31 PM  Theresa Reese [address] [place], [place], [address] Phone: [phone]   Fax:  [phone]  Name: Theresa Reese MRN: [id number] Date of Birth: 1950-04-03

## 2018-12-27 ENCOUNTER — Other Ambulatory Visit: Payer: Self-pay

## 2018-12-27 ENCOUNTER — Ambulatory Visit: Payer: Medicare Other | Admitting: Physical Therapy

## 2018-12-27 DIAGNOSIS — M5442 Lumbago with sciatica, left side: Secondary | ICD-10-CM | POA: Diagnosis not present

## 2018-12-27 DIAGNOSIS — R293 Abnormal posture: Secondary | ICD-10-CM

## 2018-12-27 DIAGNOSIS — M6281 Muscle weakness (generalized): Secondary | ICD-10-CM

## 2018-12-27 NOTE — Therapy (Signed)
[facility] [place], [place], [address] Phone: [phone]   Fax:  [phone]  Physical Therapy Treatment  Patient Details  Name: Theresa Reese MRN: [id number] Date of Birth: 12-14-1950 Referring Provider (PT): Cari Caraway, MD   Encounter Date: 12/27/2018  PT End of Session - 12/27/18 1709    Visit Number  5    Number of Visits  8    Date for PT Re-Evaluation  01/15/19    Authorization Type  FOTO; progress note every 10th visit; KX modifier at 15th visit    PT Start Time  0320    PT Stop Time  0419    PT Time Calculation (min)  59 min    Activity Tolerance  Patient tolerated treatment well    Behavior During Therapy  [facility] for tasks assessed/performed       Past Medical History:  Diagnosis Date  . Anxiety   . Depression   . Depression with anxiety   . Hypercholesteremia   . Hypothyroid   . Insomnia   . Left knee DJD   . Lymphocytic colitis     Past Surgical History:  Procedure Laterality Date  . CESAREAN SECTION  1987  . CESAREAN SECTION  1990  . COLONOSCOPY    . KNEE ARTHROSCOPY W/ MENISCECTOMY Left 2014   Dr Tonita Cong  . low back surgery  1997   Dr Rita Ohara  . low back surgery  1994   Dr Rolin Barry  . RETINAL DETACHMENT SURGERY Right 2013  . TOTAL KNEE ARTHROPLASTY Left 09/09/2013   Procedure: LEFT TOTAL KNEE ARTHROPLASTY;  Surgeon: Lorn Junes, MD;  Location: [place];  Service: Orthopedics;  Laterality: Left;    There were no vitals filed for this visit.  Subjective Assessment - 12/27/18 1657    Subjective  COVID-19 screen performed prior to patient entering clinic. I much better.  I was able to use my push mower.    Limitations  Sitting;Lifting;Standing    How long can you sit comfortably?  30 minutes    How long can you stand comfortably?  15-20 minutes    How long can you walk comfortably?  15-20 minutes    Diagnostic tests  n/a    Patient Stated Goals  decrease pain; go back to normal.     Currently in Pain?  Yes    Pain Score  1     Pain Location  Back    Pain Orientation  Left;Lower    Pain Descriptors / Indicators  Sore    Pain Type  Acute pain    Pain Onset  1 to 4 weeks ago                       [facility] Adult PT Treatment/Exercise - 12/27/18 0001      Modalities   Modalities  Electrical Stimulation;Moist Heat;Ultrasound      Moist Heat Therapy   Number Minutes Moist Heat  20 Minutes    Moist Heat Location  Lumbar Spine      Electrical Stimulation   Electrical Stimulation Location  Left low back/left SIJ.    Electrical Stimulation Action  Pre-mod to both areas (4 electrodes).    Electrical Stimulation Parameters  80-150 Hz x 20 minutes.    Electrical Stimulation Goals  Pain      Ultrasound   Ultrasound Location  Left low back    Ultrasound Parameters  Combo e'stim/U/S at 1.50 W/CM2 x 12 minutes.  Ultrasound Goals  Pain      Manual Therapy   Manual Therapy  Soft tissue mobilization    Soft tissue mobilization  Rt sdly position with pillow between knees for comfort:  STW/M x 12 minutes to left low back and SIJ.                  PT Long Term Goals - 12/11/18 1726      PT LONG TERM GOAL #1   Title  Patient will be independent with HEP    Time  4    Period  Weeks    Status  New      PT LONG TERM GOAL #2   Title  Patient will demonstrate 4+/5 left LE MMT to improve stability during functional tasks.    Time  4    Period  Weeks    Status  New      PT LONG TERM GOAL #3   Title  Patient will report a centralization of neurological symptoms from lateral knee to low back to reduce nerve irritation.    Time  4    Period  Weeks    Status  New      PT LONG TERM GOAL #4   Title  Patient will demonstrate proper lifting mechanics to protect spine during functional tasks.    Time  4    Period  Weeks    Status  New            Plan - 12/27/18 1707    Clinical Impression Statement  Patient is doing very well reporting a  significant reduction in pain.  She was found to be quite palpably tender over her left iliolumbar ligament however.  She did very well with treatment today.    Personal Factors and Comorbidities  Age;Comorbidity 1    Comorbidities  HTN, history of lumbar surgery, history of left TKA    Examination-Activity Limitations  Bend;Lift    Stability/Clinical Decision Making  Stable/Uncomplicated    Rehab Potential  Good    PT Frequency  2x / week    PT Duration  4 weeks    PT Treatment/Interventions  ADLs/Self Care Home Management;Cryotherapy;Electrical Stimulation;Moist Heat;Gait training;Stair training;Functional mobility training;Therapeutic activities;Therapeutic exercise;Passive range of motion;Neuromuscular re-education;Manual techniques;Patient/family education    PT Next Visit Plan  Assess goals next visit; Nustep; core stabilization, LE strengthening, body mechanics for lifting, modalities PRN for pain relief.    PT Home Exercise Plan  see patient education    Consulted and Agree with Plan of Care  Patient       Patient will benefit from skilled therapeutic intervention in order to improve the following deficits and impairments:  Pain, Decreased activity tolerance, Decreased range of motion, Decreased strength, Postural dysfunction  Visit Diagnosis: Acute left-sided low back pain with left-sided sciatica  Muscle weakness (generalized)  Abnormal posture     Problem List Patient Active Problem List   Diagnosis Date Noted  . DJD (degenerative joint disease) of knee 09/09/2013  . Insomnia   . Depression with anxiety   . Hypothyroid   . Lymphocytic colitis   . Left knee DJD     Jonnie Kubly, [place] MPT 12/27/2018, 5:10 PM  [facility] [address] [place], [place], [address] Phone: [phone]   Fax:  [phone]  Name: Theresa Reese MRN: [id number] Date of Birth: 04-29-1950

## 2019-01-01 ENCOUNTER — Other Ambulatory Visit: Payer: Self-pay

## 2019-01-01 ENCOUNTER — Ambulatory Visit: Payer: Medicare Other | Admitting: *Deleted

## 2019-01-01 DIAGNOSIS — R293 Abnormal posture: Secondary | ICD-10-CM

## 2019-01-01 DIAGNOSIS — M6281 Muscle weakness (generalized): Secondary | ICD-10-CM

## 2019-01-01 DIAGNOSIS — M5442 Lumbago with sciatica, left side: Secondary | ICD-10-CM | POA: Diagnosis not present

## 2019-01-01 NOTE — Therapy (Signed)
[facility] [address] [place], [place], [address] Phone: [phone]   Fax:  [phone]  Physical Therapy Treatment  Patient Details  Name: Theresa Reese MRN: [id number] Date of Birth: Jan 22, 1951 Referring Provider (PT): Gweneth Dimitri, MD   Encounter Date: 01/01/2019  PT End of Session - 01/01/19 1613    Visit Number  6    Number of Visits  8    Date for PT Re-Evaluation  01/15/19    Authorization Type  FOTO; progress note every 10th visit; KX modifier at 15th visit    PT Start Time  1600    PT Stop Time  1650    PT Time Calculation (min)  50 min       Past Medical History:  Diagnosis Date  . Anxiety   . Depression   . Depression with anxiety   . Hypercholesteremia   . Hypothyroid   . Insomnia   . Left knee DJD   . Lymphocytic colitis     Past Surgical History:  Procedure Laterality Date  . CESAREAN SECTION  1987  . CESAREAN SECTION  1990  . COLONOSCOPY    . KNEE ARTHROSCOPY W/ MENISCECTOMY Left 2014   Dr Shelle Iron  . low back surgery  1997   Dr Jule Ser  . low back surgery  1994   Dr Elesa Hacker  . RETINAL DETACHMENT SURGERY Right 2013  . 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                    [facility] Adult PT Treatment/Exercise - 01/01/19 0001      Exercises   Exercises  Lumbar;Knee/Hip      Lumbar Exercises: Aerobic   Nustep  L4 x 10 mins      Modalities   Modalities  Electrical Stimulation;Moist Heat;Ultrasound      Moist Heat Therapy   Number Minutes Moist Heat  15 Minutes    Moist Heat Location  Lumbar Spine      Electrical Stimulation   Electrical Stimulation Location  Left low back/left SIJ.    Electrical Stimulation Action  IFC x 15 mins 80-150hz     Electrical Stimulation Goals  Pain      Ultrasound   Ultrasound Location  Left LB    Ultrasound Parameters  combo/ estim 1.5 w cm2 x 10 mins    Ultrasound Goals  Pain      Manual Therapy   Manual Therapy  Soft tissue mobilization    Soft tissue mobilization  Rt sdly position with pillow between knees for comfort:  STW/M x 10 minutes to left low back and SIJ.                  PT Long Term Goals - 12/11/18 1726      PT LONG TERM GOAL #1   Title  Patient will be independent with HEP    Time  4    Period  Weeks    Status  New      PT LONG TERM GOAL #2   Title  Patient will demonstrate 4+/5 left LE MMT to improve stability during functional tasks.    Time  4    Period  Weeks    Status  New      PT LONG  TERM GOAL #3   Title  Patient will report a centralization of neurological symptoms from lateral knee to low back to reduce nerve irritation.    Time  4    Period  Weeks    Status  New      PT LONG TERM GOAL #4   Title  Patient will demonstrate proper lifting mechanics to protect spine during functional tasks.    Time  4    Period  Weeks    Status  New            Plan - 01/01/19 1614    Clinical Impression Statement  Pt arrived today doing better with decreased pain and tenderness in her LB. She was able to perform some exs without complaints today and tolerated Rx very well. Mild tenderness along LT SIJ, but 50-60% better. Normal modality response today.    Personal Factors and Comorbidities  Age;Comorbidity 1    Comorbidities  HTN, history of lumbar surgery, history of left TKA    Examination-Activity Limitations  Bend;Lift    Stability/Clinical Decision Making  Stable/Uncomplicated    Rehab Potential  Good    PT Frequency  2x / week    PT Duration  4 weeks    PT Treatment/Interventions  ADLs/Self Care Home Management;Cryotherapy;Electrical Stimulation;Moist Heat;Gait training;Stair training;Functional mobility training;Therapeutic activities;Therapeutic exercise;Passive range of motion;Neuromuscular  re-education;Manual techniques;Patient/family education    PT Next Visit Plan  Assess goals next visit; Nustep; core stabilization, LE strengthening, body mechanics for lifting, modalities PRN for pain relief.    PT Home Exercise Plan  see patient education       Patient will benefit from skilled therapeutic intervention in order to improve the following deficits and impairments:  Pain, Decreased activity tolerance, Decreased range of motion, Decreased strength, Postural dysfunction  Visit Diagnosis: Muscle weakness (generalized)  Acute left-sided low back pain with left-sided sciatica  Abnormal posture     Problem List Patient Active Problem List   Diagnosis Date Noted  . DJD (degenerative joint disease) of knee 09/09/2013  . Insomnia   . Depression with anxiety   . Hypothyroid   . Lymphocytic colitis   . Left knee DJD     ,[name], PTA 01/01/2019, 5:17 PM  [facility] [place], [place], [address] Phone: [phone]   Fax:  [phone]  Name: Theresa Reese MRN: [id number] Date of Birth: 12/12/1950

## 2019-01-03 ENCOUNTER — Encounter: Payer: Self-pay | Admitting: *Deleted

## 2019-01-03 ENCOUNTER — Other Ambulatory Visit: Payer: Self-pay

## 2019-01-03 ENCOUNTER — Ambulatory Visit: Payer: Medicare Other | Admitting: *Deleted

## 2019-01-03 DIAGNOSIS — M5442 Lumbago with sciatica, left side: Secondary | ICD-10-CM

## 2019-01-03 DIAGNOSIS — R293 Abnormal posture: Secondary | ICD-10-CM

## 2019-01-03 DIAGNOSIS — M6281 Muscle weakness (generalized): Secondary | ICD-10-CM

## 2019-01-03 NOTE — Therapy (Signed)
[facility] [address] [place], [place], [address] Phone: [phone]   Fax:  [phone]  Physical Therapy Treatment  Patient Details  Name: Theresa Reese MRN: [id number] Date of Birth: 14-Jul-1950 Referring Provider (PT): Gweneth Dimitri, MD   Encounter Date: 01/03/2019  PT End of Session - 01/03/19 1524    Visit Number  7    Number of Visits  8    Date for PT Re-Evaluation  01/15/19    Authorization Type  FOTO; progress note every 10th visit; KX modifier at 15th visit    PT Start Time  1515    PT Stop Time  1605    PT Time Calculation (min)  50 min       Past Medical History:  Diagnosis Date  . Anxiety   . Depression   . Depression with anxiety   . Hypercholesteremia   . Hypothyroid   . Insomnia   . Left knee DJD   . Lymphocytic colitis     Past Surgical History:  Procedure Laterality Date  . CESAREAN SECTION  1987  . CESAREAN SECTION  1990  . COLONOSCOPY    . KNEE ARTHROSCOPY W/ MENISCECTOMY Left 2014   Dr Shelle Iron  . low back surgery  1997   Dr Jule Ser  . low back surgery  1994   Dr Elesa Hacker  . RETINAL DETACHMENT SURGERY Right 2013  . 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  Subjective Assessment - 01/03/19 1523    Subjective  COVID-19 screen performed prior to patient entering clinic. I much better.  Just soreness today    Limitations  Sitting;Lifting;Standing    How long can you sit comfortably?  30 minutes    How long can you stand comfortably?  15-20 minutes    How long can you walk comfortably?  15-20 minutes    Diagnostic tests  n/a    Patient Stated Goals  decrease pain; go back to normal.    Currently in Pain?  Yes    Pain Score  1     Pain Location  Back    Pain Orientation  Left;Lower    Pain Descriptors / Indicators   Sore    Pain Type  Acute pain    Pain Onset  1 to 4 weeks ago                       [facility] Adult PT Treatment/Exercise - 01/03/19 0001      Exercises   Exercises  Lumbar;Knee/Hip      Lumbar Exercises: Aerobic   Nustep  L4 x 10 mins      Modalities   Modalities  Electrical Stimulation;Moist Heat;Ultrasound      Moist Heat Therapy   Number Minutes Moist Heat  15 Minutes    Moist Heat Location  Lumbar Spine      Electrical Stimulation   Electrical Stimulation Location  Left low back/left SIJ.    Electrical Stimulation Action  IFC x15 mins 80-150hz     Electrical Stimulation Goals  Pain      Ultrasound   Ultrasound Location  LT LB     Ultrasound Parameters  combo/ estim  1.5 w/cm2 x 10 mins    Ultrasound Goals  Pain      Manual  Therapy   Manual Therapy  Soft tissue mobilization    Soft tissue mobilization  Rt sdly position with pillow between knees for comfort:  STW/M x 10 minutes to left low back and SIJ.                  PT Long Term Goals - 12/11/18 1726      PT LONG TERM GOAL #1   Title  Patient will be independent with HEP    Time  4    Period  Weeks    Status  New      PT LONG TERM GOAL #2   Title  Patient will demonstrate 4+/5 left LE MMT to improve stability during functional tasks.    Time  4    Period  Weeks    Status  New      PT LONG TERM GOAL #3   Title  Patient will report a centralization of neurological symptoms from lateral knee to low back to reduce nerve irritation.    Time  4    Period  Weeks    Status  New      PT LONG TERM GOAL #4   Title  Patient will demonstrate proper lifting mechanics to protect spine during functional tasks.    Time  4    Period  Weeks    Status  New            Plan - 01/03/19 1708    Clinical Impression Statement  Pt arrived today still doing well and is 60-70% better. She had minimal LBP today and was able to complete ex with no complaints. [place] and STW were performed with only mild  soreness at LT SIJ found today. Assess LTGs, FOTO  and DC next visit    Personal Factors and Comorbidities  Age;Comorbidity 1    Comorbidities  HTN, history of lumbar surgery, history of left TKA    Stability/Clinical Decision Making  Stable/Uncomplicated    Rehab Potential  Good    PT Frequency  2x / week    PT Duration  4 weeks    PT Treatment/Interventions  ADLs/Self Care Home Management;Cryotherapy;Electrical Stimulation;Moist Heat;Gait training;Stair training;Functional mobility training;Therapeutic activities;Therapeutic exercise;Passive range of motion;Neuromuscular re-education;Manual techniques;Patient/family education    PT Next Visit Plan  DC next visit FOTO       Patient will benefit from skilled therapeutic intervention in order to improve the following deficits and impairments:  Pain, Decreased activity tolerance, Decreased range of motion, Decreased strength, Postural dysfunction  Visit Diagnosis: Muscle weakness (generalized)  Acute left-sided low back pain with left-sided sciatica  Abnormal posture     Problem List Patient Active Problem List   Diagnosis Date Noted  . DJD (degenerative joint disease) of knee 09/09/2013  . Insomnia   . Depression with anxiety   . Hypothyroid   . Lymphocytic colitis   . Left knee DJD     ,[name] , PTA 01/03/2019, 5:16 PM  [facility] [address] [place], [place], [address] Phone: [phone]   Fax:  [phone]  Name: Theresa Reese MRN: [id number] Date of Birth: 12-11-1950

## 2019-01-08 ENCOUNTER — Other Ambulatory Visit: Payer: Self-pay

## 2019-01-08 ENCOUNTER — Ambulatory Visit: Payer: Medicare Other | Admitting: Physical Therapy

## 2019-01-08 ENCOUNTER — Encounter: Payer: Self-pay | Admitting: Physical Therapy

## 2019-01-08 DIAGNOSIS — M5442 Lumbago with sciatica, left side: Secondary | ICD-10-CM | POA: Diagnosis not present

## 2019-01-08 DIAGNOSIS — R293 Abnormal posture: Secondary | ICD-10-CM

## 2019-01-08 DIAGNOSIS — M6281 Muscle weakness (generalized): Secondary | ICD-10-CM

## 2019-01-08 NOTE — Therapy (Signed)
[facility] [place], [place], [address] Phone: [phone]   Fax:  [phone]  Physical Therapy Treatment PHYSICAL THERAPY DISCHARGE SUMMARY  Visits from Start of Care: 8  Current functional level related to goals / functional outcomes: See below   Remaining deficits: See goals   Education / Equipment: HEP Plan: Patient agrees to discharge.  Patient goals were met. Patient is being discharged due to meeting the stated rehab goals.  ?????  [name], PT, DPT   Patient Details  Name: Theresa Reese MRN: [id number] Date of Birth: Apr 13, 1950 Referring Provider (PT): Cari Caraway, MD   Encounter Date: 01/08/2019  PT End of Session - 01/08/19 1657    Visit Number  8    Number of Visits  8    Date for PT Re-Evaluation  01/15/19    Authorization Type  FOTO; progress note every 10th visit; KX modifier at 15th visit    PT Start Time  1432    PT Stop Time  1528    PT Time Calculation (min)  56 min    Activity Tolerance  Patient tolerated treatment well    Behavior During Therapy  [facility] for tasks assessed/performed       Past Medical History:  Diagnosis Date  . Anxiety   . Depression   . Depression with anxiety   . Hypercholesteremia   . Hypothyroid   . Insomnia   . Left knee DJD   . Lymphocytic colitis     Past Surgical History:  Procedure Laterality Date  . CESAREAN SECTION  1987  . CESAREAN SECTION  1990  . COLONOSCOPY    . KNEE ARTHROSCOPY W/ MENISCECTOMY Left 2014   Dr Tonita Cong  . low back surgery  1997   Dr Rita Ohara  . low back surgery  1994   Dr Rolin Barry  . RETINAL DETACHMENT SURGERY Right 2013  . TOTAL KNEE ARTHROPLASTY Left 09/09/2013   Procedure: LEFT TOTAL KNEE ARTHROPLASTY;  Surgeon: Lorn Junes, MD;  Location: [facility];  Service: Orthopedics;  Laterality: Left;    There were no vitals filed for this visit.  Subjective Assessment - 01/08/19 1434    Subjective  COVID-19 screen performed  prior to patient entering clinic. Patient is pleased with progress.    Limitations  Sitting;Lifting;Standing    How long can you sit comfortably?  30 minutes    How long can you stand comfortably?  15-20 minutes    How long can you walk comfortably?  15-20 minutes    Diagnostic tests  n/a    Patient Stated Goals  decrease pain; go back to normal.    Currently in Pain?  No/denies         [facility] PT Assessment - 01/08/19 0001      Assessment   Medical Diagnosis  Lumbar radiculopathy    Referring Provider (PT)  Cari Caraway, MD    Next MD Visit  N/A    Prior Therapy  yes      Precautions   Precautions  None                   [facility] Adult PT Treatment/Exercise - 01/08/19 0001      Exercises   Exercises  Lumbar;Knee/Hip      Lumbar Exercises: Aerobic   Nustep  L4 x 10 mins      Modalities   Modalities  Electrical Stimulation;Moist Heat;Ultrasound      Moist Heat Therapy   Number  Minutes Moist Heat  15 Minutes    Moist Heat Location  Lumbar Spine      Electrical Stimulation   Electrical Stimulation Location  bilateral low back    Electrical Stimulation Action  IFC    Electrical Stimulation Parameters  80-150 hz x15 mins    Electrical Stimulation Goals  Pain      Ultrasound   Ultrasound Location  bilateral lumbar paraspinals    Ultrasound Parameters  combo e-stim/US 100% 1.5 w/cm2  16mz x10 mins    Ultrasound Goals  Pain      Manual Therapy   Manual Therapy  Soft tissue mobilization    Soft tissue mobilization  Rt sdly position with pillow between knees for comfort: STW/M to bilateral lumbar paraspinals                   PT Long Term Goals - 01/08/19 1442      PT LONG TERM GOAL #1   Title  Patient will be independent with HEP    Time  4    Period  Weeks    Status  Achieved      PT LONG TERM GOAL #2   Title  Patient will demonstrate 4+/5 left LE MMT to improve stability during functional tasks.    Time  4    Period  Weeks    Status   Achieved      PT LONG TERM GOAL #3   Title  Patient will report a centralization of neurological symptoms from lateral knee to low back to reduce nerve irritation.    Time  4    Period  Weeks    Status  Achieved      PT LONG TERM GOAL #4   Title  Patient will demonstrate proper lifting mechanics to protect spine during functional tasks.    Time  4    Period  Weeks    Status  Achieved            Plan - 01/08/19 1659    Clinical Impression Statement  Patient responded well to therapy with no reports of pain. Patient denied pain with exercises or combo US/E-stim. Patient goals all met. Patient provided with new HEP as well as resistance bands. Patient reported understanding.    Personal Factors and Comorbidities  Age;Comorbidity 1    Comorbidities  HTN, history of lumbar surgery, history of left TKA    Examination-Activity Limitations  Bend;Lift    Stability/Clinical Decision Making  Stable/Uncomplicated    Clinical Decision Making  Low    Rehab Potential  Good    PT Frequency  2x / week    PT Duration  4 weeks    PT Treatment/Interventions  ADLs/Self Care Home Management;Cryotherapy;Electrical Stimulation;Moist Heat;Gait training;Stair training;Functional mobility training;Therapeutic activities;Therapeutic exercise;Passive range of motion;Neuromuscular re-education;Manual techniques;Patient/family education    PT Next Visit Plan  DC    PT Home Exercise Plan  see patient education    Consulted and Agree with Plan of Care  Patient       Patient will benefit from skilled therapeutic intervention in order to improve the following deficits and impairments:  Pain, Decreased activity tolerance, Decreased range of motion, Decreased strength, Postural dysfunction  Visit Diagnosis: Muscle weakness (generalized)  Acute left-sided low back pain with left-sided sciatica  Abnormal posture     Problem List Patient Active Problem List   Diagnosis Date Noted  . DJD (degenerative  joint disease) of knee 09/09/2013  . Insomnia   .  Depression with anxiety   . Hypothyroid   . Lymphocytic colitis   . Left knee DJD     [name] 01/08/2019, 5:02 PM  [facility] [address] [place], [place], [address] Phone: [phone]   Fax:  [phone]  Name: Theresa Reese MRN: [id number] Date of Birth: 1950/10/25

## 2019-01-10 ENCOUNTER — Encounter: Payer: Medicare Other | Admitting: *Deleted

## 2019-03-19 ENCOUNTER — Ambulatory Visit: Payer: Medicare Other | Attending: Family Medicine | Admitting: Physical Therapy

## 2019-03-19 ENCOUNTER — Other Ambulatory Visit: Payer: Self-pay

## 2019-03-19 DIAGNOSIS — M25512 Pain in left shoulder: Secondary | ICD-10-CM | POA: Insufficient documentation

## 2019-03-19 DIAGNOSIS — M25511 Pain in right shoulder: Secondary | ICD-10-CM | POA: Insufficient documentation

## 2019-03-19 DIAGNOSIS — G8929 Other chronic pain: Secondary | ICD-10-CM | POA: Diagnosis present

## 2019-03-19 NOTE — Therapy (Signed)
[facility] [address] [place], [place], [address] Phone: [phone]   Fax:  [phone]  Physical Therapy Evaluation  Patient Details  Name: Theresa Reese MRN: [id number] Date of Birth: 1950/04/05 Referring Provider (PT): Gweneth DimitriWendy McNeill MD   Encounter Date: 03/19/2019  PT End of Session - 03/19/19 1529    Visit Number  1    Number of Visits  12    Date for PT Re-Evaluation  04/30/19    PT Start Time  0233    PT Stop Time  0319    PT Time Calculation (min)  46 min       Past Medical History:  Diagnosis Date  . Anxiety   . Depression   . Depression with anxiety   . Hypercholesteremia   . Hypothyroid   . Insomnia   . Left knee DJD   . Lymphocytic colitis     Past Surgical History:  Procedure Laterality Date  . CESAREAN SECTION  1987  . CESAREAN SECTION  1990  . COLONOSCOPY    . KNEE ARTHROSCOPY W/ MENISCECTOMY Left 2014   Dr Shelle IronBeane  . low back surgery  1997   Dr Jule SerNudleman  . low back surgery  1994   Dr Elesa Hackereaton  . RETINAL DETACHMENT SURGERY Right 2013  . TOTAL KNEE ARTHROPLASTY Left 09/09/2013   Procedure: LEFT TOTAL KNEE ARTHROPLASTY;  Surgeon: Nilda Simmerobert A Wainer, MD;  Location[facility];  Service: Orthopedics;  Laterality: Left;    There were no vitals filed for this visit.   Subjective Assessment - 03/19/19 1533    Subjective  COVID-19 screen performed prior to patient entering clinic.  The patient presents to the clinic with bilateral shoulder pain over the last several months.  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  She has been taken off her Lipitor as well to see if this may be contributing to her pain.    Pertinent History  TKA, lumbar surgery, OA.    Patient Stated Goals  Reduce pain and feel stronger.    Currently in Pain?  Yes    Pain Score  4     Pain Location  Shoulder    Pain Orientation  Right;Left    Pain Descriptors / Indicators  Aching     Pain Type  Chronic pain    Pain Onset  More than a month ago    Pain Frequency  Constant    Aggravating Factors   Lifting and reaching behind back.    Pain Relieving Factors  NSAID         [facility] PT Assessment - 03/19/19 0001      Assessment   Medical Diagnosis  Bilateral shoulder pain.    Referring Provider (PT)  Gweneth DimitriWendy McNeill MD    Onset Date/Surgical Date  --   ~6 months.     Precautions   Precautions  None      Restrictions   Weight Bearing Restrictions  No      Balance Screen   Has the patient fallen in the past 6 months  No    Has the patient had a decrease in activity level because of a fear of falling?   No    Is the patient reluctant to leave their home because of a fear of falling?   No      Home Public house managernvironment   Living Environment  Private residence  Prior Function   Level of Independence  Independent      ROM / Strength   AROM / PROM / Strength  AROM;Strength      AROM   Overall AROM Comments  Essentiall full bilateral shoullder range of motion.  She can reach behind her back to L3 but she performs the motion slowly and it is painful.      Strength   Overall Strength Comments  Right shoulder IR/ER= 4+/5, left shoulder ER= 4- to 4/5.      Palpation   Palpation comment  Tender to palpation over both bicipital grooves and proximal biceps.      Special Tests   Other special tests  (+) shoulder Impingement test.      Ambulation/Gait   Gait Comments  WNL.                Objective measurements completed on examination: See above findings.      [facility] Adult PT Treatment/Exercise - 03/19/19 0001      Modalities   Modalities  Electrical Stimulation;Moist Heat      Moist Heat Therapy   Number Minutes Moist Heat  20 Minutes    Moist Heat Location  --   Bilateral anterior shoulders.     Acupuncturist Location  Bilateral anterior shoulders.    Electrical Stimulation Action  Pre-mod.    Electrical  Stimulation Parameters  80-150 Hz x 20 minutes.    Electrical Stimulation Goals  Pain                  PT Long Term Goals - 03/19/19 1548      PT LONG TERM GOAL #1   Title  Patient will be independent with HEP    Time  6    Period  Weeks    Status  New      PT LONG TERM GOAL #2   Title  Perform ADL's with pain not > 3/10.    Time  6    Period  Weeks    Status  New      PT LONG TERM GOAL #3   Title  Reach behind back to L1 with pain not > 2/10.    Time  6    Period  Weeks    Status  New             Plan - 03/19/19 1543    Clinical Impression Statement  The patient presents to OPPT with c/o bilateral shoulder pain that has been ongoing for several months.  She c/o pain over both bicipital grooves and proximal biceps region.  She struggles to ger her hands behind her back and the movement is performed slowly and painfully.  She has reproducible pain with impingement testing.  Patient will benefit from skilled physical therapy intervention to address deficits and pain.    Personal Factors and Comorbidities  Comorbidity 1;Comorbidity 2    Comorbidities  TKA, lumbar surgery, OA.    Examination-Activity Limitations  Lift    Examination-Participation Restrictions  Other    Stability/Clinical Decision Making  Evolving/Moderate complexity    Clinical Decision Making  Low    Rehab Potential  Good    PT Frequency  2x / week    PT Duration  6 weeks    PT Treatment/Interventions  ADLs/Self Care Home Management;Cryotherapy;Electrical Stimulation;Ultrasound;Moist Heat;Therapeutic activities;Therapeutic exercise;Manual techniques;Patient/family education;Passive range of motion;Dry needling;Vasopneumatic Device    PT Next Visit Plan  UBE, RW4, bicep  curls.  Modalities and STW/M as needed.    Consulted and Agree with Plan of Care  Patient       Patient will benefit from skilled therapeutic intervention in order to improve the following deficits and impairments:  Pain,  Decreased activity tolerance, Decreased strength  Visit Diagnosis: Chronic right shoulder pain - Plan: PT plan of care cert/re-cert  Chronic left shoulder pain - Plan: PT plan of care cert/re-cert     Problem List Patient Active Problem List   Diagnosis Date Noted  . DJD (degenerative joint disease) of knee 09/09/2013  . Insomnia   . Depression with anxiety   . Hypothyroid   . Lymphocytic colitis   . Left knee DJD     Theresa Reese, Theresa Reese 03/19/2019, 3:51 PM  [facility] [address] [place], [place], [address] Phone: [phone]   Fax:  [phone]  Name: Theresa Reese MRN: [id number] Date of Birth: 1951/01/10

## 2019-03-26 ENCOUNTER — Ambulatory Visit: Payer: Medicare PPO | Attending: Family Medicine | Admitting: *Deleted

## 2019-03-26 ENCOUNTER — Other Ambulatory Visit: Payer: Self-pay

## 2019-03-26 DIAGNOSIS — M25512 Pain in left shoulder: Secondary | ICD-10-CM | POA: Diagnosis present

## 2019-03-26 DIAGNOSIS — M25511 Pain in right shoulder: Secondary | ICD-10-CM | POA: Diagnosis present

## 2019-03-26 DIAGNOSIS — M5442 Lumbago with sciatica, left side: Secondary | ICD-10-CM | POA: Diagnosis present

## 2019-03-26 DIAGNOSIS — G8929 Other chronic pain: Secondary | ICD-10-CM | POA: Insufficient documentation

## 2019-03-26 DIAGNOSIS — M6281 Muscle weakness (generalized): Secondary | ICD-10-CM | POA: Diagnosis present

## 2019-03-26 NOTE — Therapy (Signed)
[facility] [address] [place], [place], [address] Phone: [phone]   Fax:  [phone]  Physical Therapy Treatment  Patient Details  Name: Theresa Reese MRN: [id number] Date of Birth: [birth year] Referring Provider (PT): Gweneth Dimitri MD   Encounter Date: 03/26/2019  PT End of Session - 03/26/19 1446    Visit Number  2    Number of Visits  12    Date for PT Re-Evaluation  04/30/19    PT Start Time  1437    PT Stop Time  1528    PT Time Calculation (min)  51 min       Past Medical History:  Diagnosis Date  . Anxiety   . Depression   . Depression with anxiety   . Hypercholesteremia   . Hypothyroid   . Insomnia   . Left knee DJD   . Lymphocytic colitis     Past Surgical History:  Procedure Laterality Date  . CESAREAN SECTION  1987  . CESAREAN SECTION  1990  . COLONOSCOPY    . KNEE ARTHROSCOPY W/ MENISCECTOMY Left 2014   Dr Shelle Iron  . low back surgery  1997   Dr Jule Ser  . low back surgery  1994   Dr Elesa Hacker  . RETINAL DETACHMENT SURGERY Right 2013  . 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  Subjective Assessment - 03/26/19 1444    Subjective  COVID-19 screen performed prior to patient entering clinic. Both shldrs are sore today    Pertinent History  TKA, lumbar surgery, OA.    Patient Stated Goals  Reduce pain and feel stronger.    Currently in Pain?  Yes    Pain Location  Shoulder    Pain Orientation  Right;Left    Pain Descriptors / Indicators  Aching    Pain Type  Chronic pain    Pain Onset  More than a month ago                       [facility] Adult PT Treatment/Exercise - 03/26/19 0001      Exercises   Exercises  Shoulder      Shoulder Exercises: ROM/Strengthening   UBE (Upper Arm Bike)  UBE x 6 mins at 120 RPMs    very sore after performing.     Modalities   Modalities  Electrical Stimulation;Moist Heat;Ultrasound      Moist Heat Therapy   Number Minutes Moist Heat  15 Minutes    Moist Heat Location  --   Bilateral anterior shoulders.     Programme researcher, broadcasting/film/video Location  Bilateral anterior shoulders.    Electrical Stimulation Action  premod    Electrical Stimulation Parameters  80-150hz  x 15 mins    Electrical Stimulation Goals  Pain      Ultrasound   Ultrasound Location  Bil shldrs anterior aspect    Ultrasound Parameters  1 w/cm2 x 8 mins to each shldr. 16 mins total    Ultrasound Goals  Pain      Manual Therapy   Manual Therapy  Soft tissue mobilization    Soft tissue mobilization  gentle STW to Bil.anterior aspect/ bicepital tendon                  PT Long  Term Goals - 03/19/19 1548      PT LONG TERM GOAL #1   Title  Patient will be independent with HEP    Time  6    Period  Weeks    Status  New      PT LONG TERM GOAL #2   Title  Perform ADL's with pain not > 3/10.    Time  6    Period  Weeks    Status  New      PT LONG TERM GOAL #3   Title  Reach behind back to L1 with pain not > 2/10.    Time  6    Period  Weeks    Status  New            Plan - 03/26/19 1635    Clinical Impression Statement  Pt arrived today with pain in both shldrs ant. aspect. She was able to perform UBE forward motion , but reports of increased soreness afterwards. [place] and STW were performed f/b modalities and tolerated quite well with decreased pain end of session.    Comorbidities  TKA, lumbar surgery, OA.    Examination-Participation Restrictions  Other    Stability/Clinical Decision Making  Evolving/Moderate complexity    Rehab Potential  Good    PT Frequency  2x / week    PT Duration  6 weeks    PT Treatment/Interventions  ADLs/Self Care Home Management;Cryotherapy;Electrical Stimulation;Ultrasound;Moist Heat;Therapeutic activities;Therapeutic  exercise;Manual techniques;Patient/family education;Passive range of motion;Dry needling;Vasopneumatic Device    PT Next Visit Plan  UBE, RW4, bicep curls.  Modalities and STW/M as needed.    Consulted and Agree with Plan of Care  Patient       Patient will benefit from skilled therapeutic intervention in order to improve the following deficits and impairments:  Pain, Decreased activity tolerance, Decreased strength  Visit Diagnosis: Chronic right shoulder pain  Chronic left shoulder pain  Muscle weakness (generalized)     Problem List Patient Active Problem List   Diagnosis Date Noted  . DJD (degenerative joint disease) of knee 09/09/2013  . Insomnia   . Depression with anxiety   . Hypothyroid   . Lymphocytic colitis   . Left knee DJD     ,[name] , PTA 03/26/2019, 4:38 PM  [facility] [address] [place], [place], [address] Phone: [phone]   Fax:  [phone]  Name: Theresa Reese MRN: [id number] Date of Birth: 1951-03-20

## 2019-03-28 ENCOUNTER — Other Ambulatory Visit: Payer: Self-pay

## 2019-03-28 ENCOUNTER — Ambulatory Visit: Payer: Medicare PPO | Admitting: *Deleted

## 2019-03-28 DIAGNOSIS — M25511 Pain in right shoulder: Secondary | ICD-10-CM | POA: Diagnosis not present

## 2019-03-28 DIAGNOSIS — M25512 Pain in left shoulder: Secondary | ICD-10-CM

## 2019-03-28 DIAGNOSIS — M6281 Muscle weakness (generalized): Secondary | ICD-10-CM

## 2019-03-28 DIAGNOSIS — G8929 Other chronic pain: Secondary | ICD-10-CM

## 2019-03-28 NOTE — Therapy (Signed)
[facility] [address] [place], [place], [address] Phone: [phone]   Fax:  [phone]  Physical Therapy Treatment  Patient Details  Name: Theresa Reese MRN: [id number] Date of Birth: [birth year] Referring Provider (PT): Gweneth Dimitri MD   Encounter Date: 03/28/2019  PT End of Session - 03/28/19 1430    Visit Number  3    Number of Visits  12    Date for PT Re-Evaluation  04/30/19    PT Start Time  1430    PT Stop Time  1520    PT Time Calculation (min)  50 min       Past Medical History:  Diagnosis Date  . Anxiety   . Depression   . Depression with anxiety   . Hypercholesteremia   . Hypothyroid   . Insomnia   . Left knee DJD   . Lymphocytic colitis     Past Surgical History:  Procedure Laterality Date  . CESAREAN SECTION  1987  . CESAREAN SECTION  1990  . COLONOSCOPY    . KNEE ARTHROSCOPY W/ MENISCECTOMY Left 2014   Dr Shelle Iron  . low back surgery  1997   Dr Jule Ser  . low back surgery  1994   Dr Elesa Hacker  . RETINAL DETACHMENT SURGERY Right 2013  . 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  Subjective Assessment - 03/28/19 1432    Subjective  COVID-19 screen performed prior to patient entering clinic. Both shldrs are sore today    Pertinent History  TKA, lumbar surgery, OA.    Patient Stated Goals  Reduce pain and feel stronger.    Currently in Pain?  Yes    Pain Score  4     Pain Location  Shoulder    Pain Orientation  Right;Left    Pain Onset  More than a month ago                       [facility] Adult PT Treatment/Exercise - 03/28/19 0001      Exercises   Exercises  Shoulder      Shoulder Exercises: Pulleys   Flexion  5 minutes      Shoulder Exercises: ROM/Strengthening   Nustep  Nustep L4 for UE activity x 10  mins      Modalities   Modalities  Electrical Stimulation;Moist Heat;Ultrasound      Moist Heat Therapy   Number Minutes Moist Heat  15 Minutes    Moist Heat Location  --   Bilateral anterior shoulders.     Programme researcher, broadcasting/film/video Location  Bilateral anterior shoulders.  premod x 15 mins 80-150hz     Electrical Stimulation Goals  Pain      Ultrasound   Ultrasound Location  Bil shldrs anterior aspect    Ultrasound Parameters  Combo 1 w/cm2 x t mins each    Ultrasound Goals  Pain                  PT Long Term Goals - 03/19/19 1548      PT LONG TERM GOAL #1   Title  Patient will be independent with HEP    Time  6    Period  Weeks    Status  New  PT LONG TERM GOAL #2   Title  Perform ADL's with pain not > 3/10.    Time  6    Period  Weeks    Status  New      PT LONG TERM GOAL #3   Title  Reach behind back to L1 with pain not > 2/10.    Time  6    Period  Weeks    Status  New            Plan - 03/28/19 1528    Clinical Impression Statement  Pt arrived today doing fair with 3/10 both shldrs. She reports very sore after UBE and would rather not perform it  today. She was able to use nustep for UE activity f/b AAROM on pulleys and did well. US/ combo was performed f/b modalitiues and tolerated well.    Personal Factors and Comorbidities  Comorbidity 1;Comorbidity 2    Comorbidities  TKA, lumbar surgery, OA.    Stability/Clinical Decision Making  Evolving/Moderate complexity    Rehab Potential  Good    PT Frequency  2x / week    PT Duration  6 weeks    PT Treatment/Interventions  ADLs/Self Care Home Management;Cryotherapy;Electrical Stimulation;Ultrasound;Moist Heat;Therapeutic activities;Therapeutic exercise;Manual techniques;Patient/family education;Passive range of motion;Dry needling;Vasopneumatic Device    PT Next Visit Plan  RW4, bicep curls.  Modalities and STW/M as needed.       Patient will benefit from skilled  therapeutic intervention in order to improve the following deficits and impairments:  Pain, Decreased activity tolerance, Decreased strength  Visit Diagnosis: Chronic right shoulder pain  Chronic left shoulder pain  Muscle weakness (generalized)     Problem List Patient Active Problem List   Diagnosis Date Noted  . DJD (degenerative joint disease) of knee 09/09/2013  . Insomnia   . Depression with anxiety   . Hypothyroid   . Lymphocytic colitis   . Left knee DJD     Kadejah Sandiford,[name], PTA 03/28/2019, 3:40 PM  [facility] [place], [place], [address] Phone: [phone]   Fax:  [phone]  Name: Theresa Reese MRN: [id number] Date of Birth: 06/23/1950

## 2019-03-28 NOTE — Therapy (Signed)
[facility] [address] [place], [place], [address] Phone: [phone]   Fax:  [phone]  Physical Therapy Treatment  Patient Details  Name: Theresa Reese MRN: [id number] Date of Birth: 10-Jan-1951 Referring Provider (PT): Gweneth Dimitri MD   Encounter Date: 03/28/2019  PT End of Session - 03/28/19 1430    Visit Number  3    Number of Visits  12    Date for PT Re-Evaluation  04/30/19    Authorization Type  Dash score   43.9 03-28-19    PT Start Time  1430    PT Stop Time  1520    PT Time Calculation (min)  50 min       Past Medical History:  Diagnosis Date  . Anxiety   . Depression   . Depression with anxiety   . Hypercholesteremia   . Hypothyroid   . Insomnia   . Left knee DJD   . Lymphocytic colitis     Past Surgical History:  Procedure Laterality Date  . CESAREAN SECTION  1987  . CESAREAN SECTION  1990  . COLONOSCOPY    . KNEE ARTHROSCOPY W/ MENISCECTOMY Left 2014   Dr Shelle Iron  . low back surgery  1997   Dr Jule Ser  . low back surgery  1994   Dr Elesa Hacker  . RETINAL DETACHMENT SURGERY Right 2013  . 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  Subjective Assessment - 03/28/19 1432    Subjective  COVID-19 screen performed prior to patient entering clinic. Both shldrs are sore today    Pertinent History  TKA, lumbar surgery, OA.    Patient Stated Goals  Reduce pain and feel stronger.    Currently in Pain?  Yes    Pain Score  4     Pain Location  Shoulder    Pain Orientation  Right;Left    Pain Onset  More than a month ago                       [facility] Adult PT Treatment/Exercise - 03/28/19 0001      Exercises   Exercises  Shoulder      Shoulder Exercises: Pulleys   Flexion  5 minutes      Shoulder Exercises:  ROM/Strengthening   Nustep  Nustep L4 for UE activity x 10 mins      Modalities   Modalities  Electrical Stimulation;Moist Heat;Ultrasound      Moist Heat Therapy   Number Minutes Moist Heat  15 Minutes    Moist Heat Location  --   Bilateral anterior shoulders.     Programme researcher, broadcasting/film/video Location  Bilateral anterior shoulders.  premod x 15 mins 80-150hz     Electrical Stimulation Goals  Pain      Ultrasound   Ultrasound Location  Bil shldrs anterior aspect    Ultrasound Parameters  Combo 1 w/cm2 x t mins each    Ultrasound Goals  Pain                  PT Long Term Goals - 03/19/19 1548      PT LONG TERM GOAL #1   Title  Patient will be independent with HEP    Time  6    Period  Weeks    Status  New      PT LONG TERM GOAL #2   Title  Perform ADL's with pain not > 3/10.    Time  6    Period  Weeks    Status  New      PT LONG TERM GOAL #3   Title  Reach behind back to L1 with pain not > 2/10.    Time  6    Period  Weeks    Status  New            Plan - 03/28/19 1528    Clinical Impression Statement  Pt arrived today doing fair with 3/10 both shldrs. She reports very sore after UBE and would rather not perform it  today. She was able to use nustep for UE activity f/b AAROM on pulleys and did well. US/ combo was performed f/b modalitiues and tolerated well.    Personal Factors and Comorbidities  Comorbidity 1;Comorbidity 2    Comorbidities  TKA, lumbar surgery, OA.    Stability/Clinical Decision Making  Evolving/Moderate complexity    Rehab Potential  Good    PT Frequency  2x / week    PT Duration  6 weeks    PT Treatment/Interventions  ADLs/Self Care Home Management;Cryotherapy;Electrical Stimulation;Ultrasound;Moist Heat;Therapeutic activities;Therapeutic exercise;Manual techniques;Patient/family education;Passive range of motion;Dry needling;Vasopneumatic Device    PT Next Visit Plan  RW4, bicep curls.  Modalities and STW/M as  needed.       Patient will benefit from skilled therapeutic intervention in order to improve the following deficits and impairments:  Pain, Decreased activity tolerance, Decreased strength  Visit Diagnosis: Chronic right shoulder pain  Chronic left shoulder pain  Muscle weakness (generalized)     Problem List Patient Active Problem List   Diagnosis Date Noted  . DJD (degenerative joint disease) of knee 09/09/2013  . Insomnia   . Depression with anxiety   . Hypothyroid   . Lymphocytic colitis   . Left knee DJD     Abhiraj Dozal,[name] 03/28/2019, 4:32 PM  [facility] [address] [place], [place], [address] Phone: [phone]   Fax:  [phone]  Name: Theresa Reese MRN: [id number] Date of Birth: 07-13-1950

## 2019-04-02 ENCOUNTER — Other Ambulatory Visit: Payer: Self-pay

## 2019-04-02 ENCOUNTER — Ambulatory Visit: Payer: Medicare PPO | Admitting: *Deleted

## 2019-04-02 DIAGNOSIS — G8929 Other chronic pain: Secondary | ICD-10-CM

## 2019-04-02 DIAGNOSIS — M6281 Muscle weakness (generalized): Secondary | ICD-10-CM

## 2019-04-02 DIAGNOSIS — M25511 Pain in right shoulder: Secondary | ICD-10-CM | POA: Diagnosis not present

## 2019-04-02 NOTE — Therapy (Signed)
[facility] [place], [place], [address] Phone: [phone]   Fax:  [phone]  Physical Therapy Treatment  Patient Details  Name: Theresa Reese MRN: [id number] Date of Birth: 1951-03-13 Referring Provider (PT): Cari Caraway MD   Encounter Date: 04/02/2019  PT End of Session - 04/02/19 1444    Visit Number  4    Number of Visits  12    Date for PT Re-Evaluation  04/30/19    Authorization Type  Dash score   43.9 03-28-19    PT Start Time  1430    PT Stop Time  1520    PT Time Calculation (min)  50 min       Past Medical History:  Diagnosis Date  . Anxiety   . Depression   . Depression with anxiety   . Hypercholesteremia   . Hypothyroid   . Insomnia   . Left knee DJD   . Lymphocytic colitis     Past Surgical History:  Procedure Laterality Date  . CESAREAN SECTION  1987  . CESAREAN SECTION  1990  . COLONOSCOPY    . KNEE ARTHROSCOPY W/ MENISCECTOMY Left 2014   Dr Tonita Cong  . low back surgery  1997   Dr Rita Ohara  . low back surgery  1994   Dr Rolin Barry  . RETINAL DETACHMENT SURGERY Right 2013  . TOTAL KNEE ARTHROPLASTY Left 09/09/2013   Procedure: LEFT TOTAL KNEE ARTHROPLASTY;  Surgeon: Lorn Junes, MD;  Location: [facility];  Service: Orthopedics;  Laterality: Left;    There were no vitals filed for this visit.  Subjective Assessment - 04/02/19 1438    Subjective  COVID-19 screen performed prior to patient entering clinic. Rt side is doing better 3/10 , but LT is 6/10    Pertinent History  TKA, lumbar surgery, OA.    Patient Stated Goals  Reduce pain and feel stronger.    Currently in Pain?  Yes    Pain Score  6     Pain Location  Shoulder    Pain Orientation  Right;Left    Pain Descriptors / Indicators  Aching    Pain Type  Chronic pain    Pain Onset  More than a month ago                       [facility] Adult PT Treatment/Exercise - 04/02/19 0001      Exercises   Exercises  Shoulder      Shoulder Exercises: Pulleys   Flexion  5 minutes      Shoulder Exercises: ROM/Strengthening   Nustep  Nustep L4 for UE activity x 10 mins      Modalities   Modalities  Electrical Stimulation;Moist Heat;Ultrasound      Moist Heat Therapy   Number Minutes Moist Heat  15 Minutes    Moist Heat Location  --   Bilateral anterior shoulders.     Acupuncturist Location  Bilateral anterior shoulders.  premod x 15 mins 80-150hz     Electrical Stimulation Goals  Pain      Ultrasound   Ultrasound Location  Bil.shldr anterior aspect    Ultrasound Parameters  Combo [place] x 14 mins 1 w/cm2     Ultrasound Goals  Pain                  PT Long Term Goals - 03/19/19 1548      PT LONG TERM GOAL #  1   Title  Patient will be independent with HEP    Time  6    Period  Weeks    Status  New      PT LONG TERM GOAL #2   Title  Perform ADL's with pain not > 3/10.    Time  6    Period  Weeks    Status  New      PT LONG TERM GOAL #3   Title  Reach behind back to L1 with pain not > 2/10.    Time  6    Period  Weeks    Status  New            Plan - 04/02/19 1446    Clinical Impression Statement  Pt arrived today doing better with decreased pain in RT shldr 3/10, but LT 6/10.  She was able to perform therex today with minimal increase in pain. [place] combo was performed and tolerated well f/b estim. Pt feels that her shldrs are getting better since starting PT with decreas4ed pain overall.    Personal Factors and Comorbidities  Comorbidity 1;Comorbidity 2    Comorbidities  TKA, lumbar surgery, OA.    Stability/Clinical Decision Making  Evolving/Moderate complexity    Rehab Potential  Good    PT Frequency  2x / week    PT Duration  6 weeks    PT Treatment/Interventions  ADLs/Self Care Home Management;Cryotherapy;Electrical Stimulation;Ultrasound;Moist Heat;Therapeutic activities;Therapeutic exercise;Manual techniques;Patient/family education;Passive range  of motion;Dry needling;Vasopneumatic Device    PT Next Visit Plan  RW4, bicep curls.  Modalities and STW/M as needed.    Consulted and Agree with Plan of Care  Patient       Patient will benefit from skilled therapeutic intervention in order to improve the following deficits and impairments:  Pain, Decreased activity tolerance, Decreased strength  Visit Diagnosis: Chronic right shoulder pain  Chronic left shoulder pain  Muscle weakness (generalized)     Problem List Patient Active Problem List   Diagnosis Date Noted  . DJD (degenerative joint disease) of knee 09/09/2013  . Insomnia   . Depression with anxiety   . Hypothyroid   . Lymphocytic colitis   . Left knee DJD     Delainie Chavana,[name], PTA 04/02/2019, 5:43 PM  [facility] [address] [place], [place], [address] Phone: [phone]   Fax:  [phone]  Name: Theresa Reese MRN: [id number] Date of Birth: [birth year]

## 2019-04-04 ENCOUNTER — Other Ambulatory Visit: Payer: Self-pay

## 2019-04-04 ENCOUNTER — Ambulatory Visit: Payer: Medicare PPO | Admitting: *Deleted

## 2019-04-04 DIAGNOSIS — M25512 Pain in left shoulder: Secondary | ICD-10-CM

## 2019-04-04 DIAGNOSIS — M6281 Muscle weakness (generalized): Secondary | ICD-10-CM

## 2019-04-04 DIAGNOSIS — G8929 Other chronic pain: Secondary | ICD-10-CM

## 2019-04-04 DIAGNOSIS — M25511 Pain in right shoulder: Secondary | ICD-10-CM | POA: Diagnosis not present

## 2019-04-04 NOTE — Therapy (Signed)
[facility] [address] [place], [place], [address] Phone: [phone]   Fax:  [phone]  Physical Therapy Treatment  Patient Details  Name: Theresa Reese MRN: [id number] Date of Birth: 08-31-1950 Referring Provider (PT): Gweneth Dimitri MD   Encounter Date: 04/04/2019  PT End of Session - 04/04/19 1543    Visit Number  5    Number of Visits  12    Date for PT Re-Evaluation  04/30/19    Authorization Type  Dash score   43.9 03-28-19    PT Start Time  1437    PT Stop Time  1534    PT Time Calculation (min)  57 min       Past Medical History:  Diagnosis Date  . Anxiety   . Depression   . Depression with anxiety   . Hypercholesteremia   . Hypothyroid   . Insomnia   . Left knee DJD   . Lymphocytic colitis     Past Surgical History:  Procedure Laterality Date  . CESAREAN SECTION  1987  . CESAREAN SECTION  1990  . COLONOSCOPY    . KNEE ARTHROSCOPY W/ MENISCECTOMY Left 2014   Dr Shelle Iron  . low back surgery  1997   Dr Jule Ser  . low back surgery  1994   Dr Elesa Hacker  . RETINAL DETACHMENT SURGERY Right 2013  . 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  Subjective Assessment - 04/04/19 1502    Subjective  COVID-19 screen performed prior to patient entering clinic. Rt side is doing better 3/10 , but LT is 6/10 still    Pertinent History  TKA, lumbar surgery, OA.    Patient Stated Goals  Reduce pain and feel stronger.    Currently in Pain?  Yes    Pain Score  6     Pain Location  Shoulder    Pain Orientation  Right;Left    Pain Descriptors / Indicators  Aching    Pain Type  Chronic pain    Pain Onset  More than a month ago                       [facility] Adult PT Treatment/Exercise - 04/04/19 0001      Exercises   Exercises  Shoulder       Shoulder Exercises: Standing   Other Standing Exercises  IR and ER with yellow Tband   2x 10 each  BIL shldrs      Shoulder Exercises: Pulleys   Flexion  5 minutes      Shoulder Exercises: ROM/Strengthening   Nustep  Nustep L3 for UE activity x 10 mins      Modalities   Modalities  Electrical Stimulation;Moist Heat;Ultrasound      Moist Heat Therapy   Number Minutes Moist Heat  15 Minutes    Moist Heat Location  Shoulder      Electrical Stimulation   Electrical Stimulation Location  Bilateral anterior shoulders.  premod x 15 mins 80-150hz     Electrical Stimulation Goals  Pain      Ultrasound   Ultrasound Location  Bil shldr Ant aspect    Ultrasound Parameters  Combo [place] 1w/ cm2 x12 min total    Ultrasound Goals  Pain  PT Long Term Goals - 03/19/19 1548      PT LONG TERM GOAL #1   Title  Patient will be independent with HEP    Time  6    Period  Weeks    Status  New      PT LONG TERM GOAL #2   Title  Perform ADL's with pain not > 3/10.    Time  6    Period  Weeks    Status  New      PT LONG TERM GOAL #3   Title  Reach behind back to L1 with pain not > 2/10.    Time  6    Period  Weeks    Status  New            Plan - 04/04/19 1736    Clinical Impression Statement  Pt arrived today doing about the same with RT shldr 3/10 and LT 6/10. She was instructed in ER and IR tband strengthening for both shldrs and band was given for HEP. Verbal and tactile cues for technique. [place] combo performed F/b estim and tolerated well.    Personal Factors and Comorbidities  Comorbidity 1;Comorbidity 2    Comorbidities  TKA, lumbar surgery, OA.    Examination-Participation Restrictions  Other    Stability/Clinical Decision Making  Evolving/Moderate complexity    Rehab Potential  Good    PT Frequency  2x / week    PT Duration  6 weeks    PT Treatment/Interventions  ADLs/Self Care Home Management;Cryotherapy;Electrical Stimulation;Ultrasound;Moist  Heat;Therapeutic activities;Therapeutic exercise;Manual techniques;Patient/family education;Passive range of motion;Dry needling;Vasopneumatic Device    PT Next Visit Plan  RW4, bicep curls.  Modalities and STW/M as needed.       Patient will benefit from skilled therapeutic intervention in order to improve the following deficits and impairments:  Pain, Decreased activity tolerance, Decreased strength  Visit Diagnosis: Chronic right shoulder pain  Chronic left shoulder pain  Muscle weakness (generalized)     Problem List Patient Active Problem List   Diagnosis Date Noted  . DJD (degenerative joint disease) of knee 09/09/2013  . Insomnia   . Depression with anxiety   . Hypothyroid   . Lymphocytic colitis   . Left knee DJD     Blanca Thornton,[name] , PTA 04/04/2019, 5:48 PM  [facility] [address] [place], [place], [address] Phone: [phone]   Fax:  [phone]  Name: Theresa Reese MRN: [id number] Date of Birth: 1950/04/21

## 2019-04-09 ENCOUNTER — Other Ambulatory Visit: Payer: Self-pay

## 2019-04-09 ENCOUNTER — Ambulatory Visit: Payer: Medicare PPO | Admitting: *Deleted

## 2019-04-09 DIAGNOSIS — G8929 Other chronic pain: Secondary | ICD-10-CM

## 2019-04-09 DIAGNOSIS — M6281 Muscle weakness (generalized): Secondary | ICD-10-CM

## 2019-04-09 DIAGNOSIS — M25512 Pain in left shoulder: Secondary | ICD-10-CM

## 2019-04-09 DIAGNOSIS — M25511 Pain in right shoulder: Secondary | ICD-10-CM | POA: Diagnosis not present

## 2019-04-09 NOTE — Therapy (Signed)
[facility] [place], [place], [address] Phone: [phone]   Fax:  [phone]  Physical Therapy Treatment  Patient Details  Name: KARINA NOFSINGER MRN: [id number] Date of Birth: 07/29/1950 Referring Provider (PT): Cari Caraway MD   Encounter Date: 04/09/2019  PT End of Session - 04/09/19 1803    Visit Number  6    Number of Visits  12    Date for PT Re-Evaluation  04/30/19    Authorization Type  Dash score   43.9 03-28-19    PT Start Time  1430    PT Stop Time  1521    PT Time Calculation (min)  51 min       Past Medical History:  Diagnosis Date  . Anxiety   . Depression   . Depression with anxiety   . Hypercholesteremia   . Hypothyroid   . Insomnia   . Left knee DJD   . Lymphocytic colitis     Past Surgical History:  Procedure Laterality Date  . CESAREAN SECTION  1987  . CESAREAN SECTION  1990  . COLONOSCOPY    . KNEE ARTHROSCOPY W/ MENISCECTOMY Left 2014   Dr Tonita Cong  . low back surgery  1997   Dr Rita Ohara  . low back surgery  1994   Dr Rolin Barry  . RETINAL DETACHMENT SURGERY Right 2013  . TOTAL KNEE ARTHROPLASTY Left 09/09/2013   Procedure: LEFT TOTAL KNEE ARTHROPLASTY;  Surgeon: Lorn Junes, MD;  Location: [facility];  Service: Orthopedics;  Laterality: Left;    There were no vitals filed for this visit.                    [facility] Adult PT Treatment/Exercise - 04/09/19 0001      Exercises   Exercises  Shoulder      Shoulder Exercises: Standing   Other Standing Exercises  IR and ER with yellow Tband   2x 10 each  BIL shldrs      Shoulder Exercises: Pulleys   Flexion  5 minutes      Shoulder Exercises: ROM/Strengthening   Nustep  Nustep L3 for UE activity x 10 mins      Modalities   Modalities  Electrical Stimulation;Moist Heat;Ultrasound      Moist Heat Therapy   Number Minutes Moist Heat  15 Minutes    Moist Heat Location  Shoulder      Electrical Stimulation   Electrical  Stimulation Location  Bilateral anterior shoulders.  premod x 15 mins 80-150hz     Electrical Stimulation Goals  Pain                  PT Long Term Goals - 03/19/19 1548      PT LONG TERM GOAL #1   Title  Patient will be independent with HEP    Time  6    Period  Weeks    Status  New      PT LONG TERM GOAL #2   Title  Perform ADL's with pain not > 3/10.    Time  6    Period  Weeks    Status  New      PT LONG TERM GOAL #3   Title  Reach behind back to L1 with pain not > 2/10.    Time  6    Period  Weeks    Status  New            Plan -  04/09/19 1804    Clinical Impression Statement  Pt arrived today reporting that RT shldr is doing much better, but LT shldr is about the same. Pt was able to perform therex without increased pain and reports performin HEP for IR/ ER at home and doing well. Normal modality response today    Comorbidities  TKA, lumbar surgery, OA.    Examination-Activity Limitations  Lift    Examination-Participation Restrictions  Other    Stability/Clinical Decision Making  Evolving/Moderate complexity    Rehab Potential  Good    PT Frequency  2x / week    PT Duration  6 weeks    PT Treatment/Interventions  ADLs/Self Care Home Management;Cryotherapy;Electrical Stimulation;Ultrasound;Moist Heat;Therapeutic activities;Therapeutic exercise;Manual techniques;Patient/family education;Passive range of motion;Dry needling;Vasopneumatic Device    PT Next Visit Plan  RW4, bicep curls.  Modalities and STW/M as needed.    Consulted and Agree with Plan of Care  Patient       Patient will benefit from skilled therapeutic intervention in order to improve the following deficits and impairments:  Pain, Decreased activity tolerance, Decreased strength  Visit Diagnosis: Chronic right shoulder pain  Chronic left shoulder pain  Muscle weakness (generalized)     Problem List Patient Active Problem List   Diagnosis Date Noted  . DJD (degenerative joint  disease) of knee 09/09/2013  . Insomnia   . Depression with anxiety   . Hypothyroid   . Lymphocytic colitis   . Left knee DJD     Charlesa Ehle,[name], PTA 04/09/2019, 6:07 PM  [facility] [address] [place], [place], [address] Phone: [phone]   Fax:  [phone]  Name: CAYLIN NASS MRN: [id number] Date of Birth: 01-21-1951

## 2019-04-11 ENCOUNTER — Other Ambulatory Visit: Payer: Self-pay

## 2019-04-11 ENCOUNTER — Ambulatory Visit: Payer: Medicare PPO | Admitting: *Deleted

## 2019-04-11 DIAGNOSIS — G8929 Other chronic pain: Secondary | ICD-10-CM

## 2019-04-11 DIAGNOSIS — M25511 Pain in right shoulder: Secondary | ICD-10-CM | POA: Diagnosis not present

## 2019-04-11 DIAGNOSIS — M6281 Muscle weakness (generalized): Secondary | ICD-10-CM

## 2019-04-11 NOTE — Therapy (Signed)
[facility] [address] [place], [place], [address] Phone: [phone]   Fax:  [phone]  Physical Therapy Treatment  Patient Details  Name: Theresa Reese MRN: [id number] Date of Birth: 05-04-1950 Referring Provider (PT): Gweneth Dimitri MD   Encounter Date: 04/11/2019  PT End of Session - 04/11/19 1450    Visit Number  7    Number of Visits  12    Date for PT Re-Evaluation  04/30/19    Authorization Type  Dash score   43.9 03-28-19    PT Start Time  1430    PT Stop Time  1521    PT Time Calculation (min)  51 min       Past Medical History:  Diagnosis Date  . Anxiety   . Depression   . Depression with anxiety   . Hypercholesteremia   . Hypothyroid   . Insomnia   . Left knee DJD   . Lymphocytic colitis     Past Surgical History:  Procedure Laterality Date  . CESAREAN SECTION  1987  . CESAREAN SECTION  1990  . COLONOSCOPY    . KNEE ARTHROSCOPY W/ MENISCECTOMY Left 2014   Dr Shelle Iron  . low back surgery  1997   Dr Jule Ser  . low back surgery  1994   Dr Elesa Hacker  . RETINAL DETACHMENT SURGERY Right 2013  . 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                    [facility] Adult PT Treatment/Exercise - 04/11/19 0001      Exercises   Exercises  Shoulder      Shoulder Exercises: Standing   Other Standing Exercises  IR and ER , extension with yellow Tband   2x 10 each  LT shldr      Shoulder Exercises: ROM/Strengthening   Nustep  Nustep L3 for UE activity x 10 mins      Modalities   Modalities  Electrical Stimulation;Moist Heat;Ultrasound      Moist Heat Therapy   Number Minutes Moist Heat  15 Minutes    Moist Heat Location  Shoulder      Electrical Stimulation   Electrical Stimulation Location  Bilateral anterior shoulders.   premod x 15 mins 80-150hz     Electrical Stimulation Goals  Pain      Manual Therapy   Manual Therapy  Soft tissue mobilization    Soft tissue mobilization  STW / IASTM  to LT.anterior aspect/ bicepital tendon, and anteriolateral aspect. TPR to LT UT                  PT Long Term Goals - 03/19/19 1548      PT LONG TERM GOAL #1   Title  Patient will be independent with HEP    Time  6    Period  Weeks    Status  New      PT LONG TERM GOAL #2   Title  Perform ADL's with pain not > 3/10.    Time  6    Period  Weeks    Status  New      PT LONG TERM GOAL #3   Title  Reach behind back to L1 with pain not > 2/10.    Time  6    Period  Weeks    Status  New            Plan - 04/11/19 1451    Clinical Impression Statement  Pt arrived today with reports of RT shldr 2/10 and LT shldr 4/10 and both feeling better. Rx focused more on LT shldr. STW /IASTM performed LT shldr with good progress. AROM today flexion Bil 145 degrees, IR HBB RT T12 and LT L3. normal modality response    Personal Factors and Comorbidities  Comorbidity 1;Comorbidity 2    Comorbidities  TKA, lumbar surgery, OA.    Examination-Activity Limitations  Lift    Examination-Participation Restrictions  Other    Stability/Clinical Decision Making  Evolving/Moderate complexity    Rehab Potential  Good    PT Frequency  2x / week    PT Duration  6 weeks    PT Treatment/Interventions  ADLs/Self Care Home Management;Cryotherapy;Electrical Stimulation;Ultrasound;Moist Heat;Therapeutic activities;Therapeutic exercise;Manual techniques;Patient/family education;Passive range of motion;Dry needling;Vasopneumatic Device    PT Next Visit Plan  RW4, bicep curls.  Modalities and STW/M as needed.    Consulted and Agree with Plan of Care  Patient       Patient will benefit from skilled therapeutic intervention in order to improve the following deficits and impairments:  Pain, Decreased activity tolerance, Decreased  strength  Visit Diagnosis: Chronic right shoulder pain  Chronic left shoulder pain  Muscle weakness (generalized)     Problem List Patient Active Problem List   Diagnosis Date Noted  . DJD (degenerative joint disease) of knee 09/09/2013  . Insomnia   . Depression with anxiety   . Hypothyroid   . Lymphocytic colitis   . Left knee DJD     Monesha Monreal,[name], PTA 04/11/2019, 3:46 PM  [facility] [address] [place], [place], [address] Phone: [phone]   Fax:  [phone]  Name: Theresa Reese MRN: [id number] Date of Birth: Feb 21, 1951

## 2019-04-16 ENCOUNTER — Ambulatory Visit: Payer: Medicare PPO | Admitting: *Deleted

## 2019-04-16 ENCOUNTER — Other Ambulatory Visit: Payer: Self-pay

## 2019-04-16 DIAGNOSIS — M25511 Pain in right shoulder: Secondary | ICD-10-CM | POA: Diagnosis not present

## 2019-04-16 DIAGNOSIS — M6281 Muscle weakness (generalized): Secondary | ICD-10-CM

## 2019-04-16 DIAGNOSIS — G8929 Other chronic pain: Secondary | ICD-10-CM

## 2019-04-16 DIAGNOSIS — M5442 Lumbago with sciatica, left side: Secondary | ICD-10-CM

## 2019-04-16 NOTE — Therapy (Signed)
[facility] [address] [place], [place], [address] Phone: [phone]   Fax:  [phone]  Physical Therapy Treatment  Patient Details  Name: Theresa Reese MRN: [id number] Date of Birth: April 24, 1950 Referring Provider (PT): Gweneth Dimitri MD   Encounter Date: 04/16/2019  PT End of Session - 04/16/19 1447    Visit Number  8    Number of Visits  12    Date for PT Re-Evaluation  04/30/19    Authorization Type  Dash score   43.9 03-28-19    PT Start Time  1430    PT Stop Time  1520    PT Time Calculation (min)  50 min       Past Medical History:  Diagnosis Date  . Anxiety   . Depression   . Depression with anxiety   . Hypercholesteremia   . Hypothyroid   . Insomnia   . Left knee DJD   . Lymphocytic colitis     Past Surgical History:  Procedure Laterality Date  . CESAREAN SECTION  1987  . CESAREAN SECTION  1990  . COLONOSCOPY    . KNEE ARTHROSCOPY W/ MENISCECTOMY Left 2014   Dr Shelle Iron  . low back surgery  1997   Dr Jule Ser  . low back surgery  1994   Dr Elesa Hacker  . RETINAL DETACHMENT SURGERY Right 2013  . 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  Subjective Assessment - 04/16/19 1444    Subjective  COVID-19 screen performed prior to patient entering clinic. Rt side is doing better 3/10 , but LT is 5/10 still    Pertinent History  TKA, lumbar surgery, OA.    Patient Stated Goals  Reduce pain and feel stronger.    Currently in Pain?  Yes    Pain Score  5     Pain Location  Shoulder    Pain Orientation  Left    Pain Onset  More than a month ago                       [facility] Adult PT Treatment/Exercise - 04/16/19 0001      Exercises   Exercises  Shoulder      Shoulder Exercises: Standing   Other Standing Exercises  IR and ER ,  extension with yellow Tband   2x 10 each  LT/ RT shldr      Shoulder Exercises: ROM/Strengthening   Nustep  Nustep L3 for UE activity x 12 mins      Modalities   Modalities  Electrical Stimulation;Moist Heat;Ultrasound      Moist Heat Therapy   Number Minutes Moist Heat  15 Minutes    Moist Heat Location  Shoulder      Electrical Stimulation   Electrical Stimulation Location  Bilateral anterior shoulders.  premod x 15 mins 80-150hz     Electrical Stimulation Goals  Pain      Manual Therapy   Manual Therapy  Soft tissue mobilization    Soft tissue mobilization  STW / IASTM  to LT.anterior aspect/ bicepital tendon, and anteriolateral aspect. TPR to LT UT                  PT Long Term Goals - 03/19/19 1548      PT LONG TERM  GOAL #1   Title  Patient will be independent with HEP    Time  6    Period  Weeks    Status  New      PT LONG TERM GOAL #2   Title  Perform ADL's with pain not > 3/10.    Time  6    Period  Weeks    Status  New      PT LONG TERM GOAL #3   Title  Reach behind back to L1 with pain not > 2/10.    Time  6    Period  Weeks    Status  New            Plan - 04/16/19 2104    Clinical Impression Statement  Pt arrived today doing a little better with decreased pain in RT and LT shldr. Rx focused on strengthening for Both shldrs as well as STW / IASTM to LT shldr. Normal modality response with Both. Pt advised to perform RTC tband exs 2xdaily due to weakness.    Personal Factors and Comorbidities  Comorbidity 1;Comorbidity 2    Comorbidities  TKA, lumbar surgery, OA.    Examination-Activity Limitations  Lift    Stability/Clinical Decision Making  Evolving/Moderate complexity    Rehab Potential  Good    PT Frequency  2x / week    PT Duration  6 weeks    PT Treatment/Interventions  ADLs/Self Care Home Management;Cryotherapy;Electrical Stimulation;Ultrasound;Moist Heat;Therapeutic activities;Therapeutic exercise;Manual techniques;Patient/family  education;Passive range of motion;Dry needling;Vasopneumatic Device    PT Next Visit Plan  RW4, bicep curls.  Modalities and STW/M as needed.       Patient will benefit from skilled therapeutic intervention in order to improve the following deficits and impairments:     Visit Diagnosis: Chronic right shoulder pain  Chronic left shoulder pain  Muscle weakness (generalized)  Acute left-sided low back pain with left-sided sciatica     Problem List Patient Active Problem List   Diagnosis Date Noted  . DJD (degenerative joint disease) of knee 09/09/2013  . Insomnia   . Depression with anxiety   . Hypothyroid   . Lymphocytic colitis   . Left knee DJD     [name],[name], PTA 04/16/2019, 9:12 PM  [facility] [address] [place], [place], [address] Phone: [phone]   Fax:  [phone]  Name: Theresa Reese MRN: [id number] Date of Birth: 09-17-1950

## 2019-04-18 ENCOUNTER — Ambulatory Visit: Payer: Medicare PPO | Admitting: *Deleted

## 2019-04-18 ENCOUNTER — Other Ambulatory Visit: Payer: Self-pay

## 2019-04-18 DIAGNOSIS — M6281 Muscle weakness (generalized): Secondary | ICD-10-CM

## 2019-04-18 DIAGNOSIS — M25512 Pain in left shoulder: Secondary | ICD-10-CM

## 2019-04-18 DIAGNOSIS — G8929 Other chronic pain: Secondary | ICD-10-CM

## 2019-04-18 DIAGNOSIS — M25511 Pain in right shoulder: Secondary | ICD-10-CM | POA: Diagnosis not present

## 2019-04-18 NOTE — Therapy (Signed)
[facility] [place], [place], [address] Phone: [phone]   Fax:  [phone]  Physical Therapy Treatment  Patient Details  Name: Theresa Reese MRN: [id number] Date of Birth: 1951/02/05 Referring Provider (PT): Cari Caraway MD   Encounter Date: 04/18/2019  PT End of Session - 04/18/19 1439    Visit Number  9    Number of Visits  12    Date for PT Re-Evaluation  04/30/19    Authorization Type  Dash score   43.9 03-28-19    PT Start Time  1430    PT Stop Time  1521    PT Time Calculation (min)  51 min       Past Medical History:  Diagnosis Date  . Anxiety   . Depression   . Depression with anxiety   . Hypercholesteremia   . Hypothyroid   . Insomnia   . Left knee DJD   . Lymphocytic colitis     Past Surgical History:  Procedure Laterality Date  . CESAREAN SECTION  1987  . CESAREAN SECTION  1990  . COLONOSCOPY    . KNEE ARTHROSCOPY W/ MENISCECTOMY Left 2014   Dr Tonita Cong  . low back surgery  1997   Dr Rita Ohara  . low back surgery  1994   Dr Rolin Barry  . RETINAL DETACHMENT SURGERY Right 2013  . TOTAL KNEE ARTHROPLASTY Left 09/09/2013   Procedure: LEFT TOTAL KNEE ARTHROPLASTY;  Surgeon: Lorn Junes, MD;  Location: [facility];  Service: Orthopedics;  Laterality: Left;    There were no vitals filed for this visit.  Subjective Assessment - 04/18/19 1438    Subjective  COVID-19 screen performed prior to patient entering clinic. LT side was very sore after last Rx, but doing even better today. Pt reports at least 50% better now    Pertinent History  TKA, lumbar surgery, OA.    Patient Stated Goals  Reduce pain and feel stronger.    Currently in Pain?  Yes    Pain Score  4     Pain Location  Shoulder    Pain Orientation  Left    Pain Descriptors / Indicators  Aching;Sore    Pain Type  Chronic pain    Pain Onset  More than a month ago                       [facility] Adult PT Treatment/Exercise -  04/18/19 0001      Exercises   Exercises  Shoulder      Shoulder Exercises: Standing   Other Standing Exercises  IR and ER , extension with yellow Tband   3x 10 each  LT/ RT shldr      Shoulder Exercises: ROM/Strengthening   Nustep  Nustep L4 for UE activity x 12 mins      Modalities   Modalities  Electrical Stimulation;Moist Heat;Ultrasound      Moist Heat Therapy   Number Minutes Moist Heat  15 Minutes    Moist Heat Location  Shoulder      Electrical Stimulation   Electrical Stimulation Location  Bilateral anterior shoulders.  premod x 15 mins 80-150hz     Electrical Stimulation Goals  Pain      Manual Therapy   Manual Therapy  Soft tissue mobilization    Soft tissue mobilization  STW / IASTM  to LT.anterior aspect/ bicepital tendon, and anteriolateral aspect. TPR to LT UT  PT Long Term Goals - 04/18/19 1732      PT LONG TERM GOAL #1   Title  Patient will be independent with HEP    Time  6    Period  Weeks    Status  Achieved      PT LONG TERM GOAL #2   Title  Perform ADL's with pain not > 3/10.    Period  Weeks    Status  On-going      PT LONG TERM GOAL #3   Title  Reach behind back to L1 with pain not > 2/10.    Time  6    Period  Weeks    Status  On-going      PT LONG TERM GOAL #4   Title  Patient will demonstrate proper lifting mechanics to protect spine during functional tasks.    Time  4    Period  Weeks    Status  Achieved            Plan - 04/18/19 1437    Clinical Impression Statement  Pt arrived today doing fairly well with reports of being atleast 50% better overall. Pt was guided through shldr strengthening exs for both with focus on RTC. Manual STW and IASTM were performed to LT shldr again with notable decrease in TPS, but still sore. Pt was given info on DN for for possible application next wk. Normal modality response today    Personal Factors and Comorbidities  Comorbidity 1;Comorbidity 2    Comorbidities   TKA, lumbar surgery, OA.    Examination-Activity Limitations  Lift    Examination-Participation Restrictions  Other    Stability/Clinical Decision Making  Evolving/Moderate complexity    Rehab Potential  Good    PT Frequency  2x / week    PT Duration  6 weeks    PT Treatment/Interventions  ADLs/Self Care Home Management;Cryotherapy;Electrical Stimulation;Ultrasound;Moist Heat;Therapeutic activities;Therapeutic exercise;Manual techniques;Patient/family education;Passive range of motion;Dry needling;Vasopneumatic Device    PT Next Visit Plan  RW4, bicep curls.  Modalities and STW/M as needed.       Patient will benefit from skilled therapeutic intervention in order to improve the following deficits and impairments:  Pain, Decreased activity tolerance, Decreased strength  Visit Diagnosis: Chronic right shoulder pain  Chronic left shoulder pain  Muscle weakness (generalized)     Problem List Patient Active Problem List   Diagnosis Date Noted  . DJD (degenerative joint disease) of knee 09/09/2013  . Insomnia   . Depression with anxiety   . Hypothyroid   . Lymphocytic colitis   . Left knee DJD     Ikechukwu Cerny,[name], PTA 04/18/2019, 5:33 PM  [facility] [address] [place], [place], [address] Phone: [phone]   Fax:  [phone]  Name: Theresa Reese MRN: [id number] Date of Birth: [birth year]

## 2019-04-18 NOTE — Patient Instructions (Signed)
[facility]  DRY NEEDLING CONSENT FORM   Trigger point dry needling is a physical therapy approach to treat Myofascial Pain and Dysfunction.  Dry Needling (DN) is a valuable and effective way to deactivate myofascial trigger points (muscle knots/pain). It is skilled intervention that uses a thin filiform needle to penetrate the skin and stimulate underlying myofascial trigger points, muscular, and connective tissues for the management of neuromusculoskeletal pain and movement impairments.  A local twitch response (LTR) will be elicited.  This can sometimes feel like a deep ache in the muscle during the procedure. Multiple trigger points in multiple muscles can be treated during each treatment.  No medication of any kind is injected.   As with any medical treatment and procedure, there are possible adverse events.  While significant adverse events are uncommon, they do sometimes occur and must be considered prior to giving consent.  1. Dry needling often causes a "post needling soreness".  There can be an increase in pain from a couple of hours to 2-3 days, followed by an improvement in the overall pain state. 2. Any time a needle is used there is a risk of infection.  However, we are using new, sterile, and disposable needles; infections are extremely rare. 3. There is a possibility that you may bleed or bruise.  You may feel tired and some nausea following treatment. 4. There is a rare possibility of a pneumothorax (air in the chest cavity). 5. Allergic reaction to nickel in the stainless steel needle. 6. If a nerve is touched, it may cause paresthesia (a prickling/shock sensation) which is usually brief, but may continue for a couple of days.  Following treatment stay hydrated.  Continue regular activities but not too vigorous initially after treatment for 24-48 hours.  Dry Needling is best when combined with other physical therapy interventions such as  strengthening, stretching and other therapeutic modalities.   PLEASE ANSWER THE FOLLOWING QUESTIONS:  Do you have a lack of sensation?   Y/N  Do you have a phobia or fear of needles  Y/N  Are you pregnant?    Y/N If yes:  How many weeks? __________ Do you have any implanted devices?  Y/N If yes:  Pacemaker/Spinal Cord Stimulator/Deep Brain Stimulator/Insulin Pump/Other: ________________ Do you have any implants?  Y/N If yes: Breast/Facial/Pecs/Buttocks/Calves/Hip  Replacement/ Knee Replacement/Other: _________ Do you take any blood thinners?   Y/N If yes: Coumadin (Warfarin)/Other: ___________________ Do you have a bleeding disorder?   Y/N If yes: What kind: _________________________________ Do you take any immunosuppressants?  Y/N If yes:   What kind: _________________________________ Do you take anti-inflammatories?   Y/N If yes: What kind: Advil/Aspirin/Other: ________________ Have you ever been diagnosed with Scoliosis? Y/N Have you had back surgery?   Y/N If yes:  Laminectomy/Fusion/Other: ___________________   I have read, or had read to me, the above.  I have had the opportunity to ask any questions.  All of my questions have been answered to my satisfaction and I understand the risks involved with dry needling.  I consent to examination and treatment at [facility], including dry needling, of any and all of my involved and affected muscles.  

## 2019-04-18 NOTE — Therapy (Signed)
[facility] [address] [place], [place], [address] Phone: [phone]   Fax:  [phone]  Physical Therapy Treatment  Patient Details  Name: Theresa Reese MRN: [id number] Date of Birth: 1950-06-26 Referring Provider (PT): Gweneth Dimitri MD   Encounter Date: 04/18/2019  PT End of Session - 04/18/19 1439    Visit Number  9    Number of Visits  12    Date for PT Re-Evaluation  04/30/19    Authorization Type  Dash score   43.9 03-28-19    PT Start Time  1430       Past Medical History:  Diagnosis Date  . Anxiety   . Depression   . Depression with anxiety   . Hypercholesteremia   . Hypothyroid   . Insomnia   . Left knee DJD   . Lymphocytic colitis     Past Surgical History:  Procedure Laterality Date  . CESAREAN SECTION  1987  . CESAREAN SECTION  1990  . COLONOSCOPY    . KNEE ARTHROSCOPY W/ MENISCECTOMY Left 2014   Dr Shelle Iron  . low back surgery  1997   Dr Jule Ser  . low back surgery  1994   Dr Elesa Hacker  . RETINAL DETACHMENT SURGERY Right 2013  . 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  Subjective Assessment - 04/18/19 1438    Subjective  COVID-19 screen performed prior to patient entering clinic. LT side was very sore after last Rx, but doing even better today    Pertinent History  TKA, lumbar surgery, OA.    Patient Stated Goals  Reduce pain and feel stronger.    Currently in Pain?  Yes    Pain Score  4     Pain Location  Shoulder    Pain Orientation  Left    Pain Descriptors / Indicators  Aching;Sore    Pain Type  Chronic pain    Pain Onset  More than a month ago                       [facility] Adult PT Treatment/Exercise - 04/18/19 0001      Exercises   Exercises  Shoulder      Shoulder Exercises: Standing   Other  Standing Exercises  IR and ER , extension with yellow Tband   2x 10 each  LT/ RT shldr      Shoulder Exercises: ROM/Strengthening   Nustep  Nustep L3 for UE activity x 12 mins      Electrical Stimulation   Electrical Stimulation Location  Bilateral anterior shoulders.  premod x 15 mins 80-150hz     Electrical Stimulation Goals  Pain      Manual Therapy   Manual Therapy  Soft tissue mobilization    Soft tissue mobilization  STW / IASTM  to LT.anterior aspect/ bicepital tendon, and anteriolateral aspect. TPR to LT UT                  PT Long Term Goals - 03/19/19 1548      PT LONG TERM GOAL #1   Title  Patient will be independent with HEP    Time  6    Period  Weeks    Status  New      PT LONG TERM  GOAL #2   Title  Perform ADL's with pain not > 3/10.    Time  6    Period  Weeks    Status  New      PT LONG TERM GOAL #3   Title  Reach behind back to L1 with pain not > 2/10.    Time  6    Period  Weeks    Status  New            Plan - 04/18/19 1437    Personal Factors and Comorbidities  Comorbidity 1;Comorbidity 2    Comorbidities  TKA, lumbar surgery, OA.    Examination-Activity Limitations  Lift    Examination-Participation Restrictions  Other    Stability/Clinical Decision Making  Evolving/Moderate complexity    Rehab Potential  Good    PT Frequency  2x / week    PT Duration  6 weeks    PT Treatment/Interventions  ADLs/Self Care Home Management;Cryotherapy;Electrical Stimulation;Ultrasound;Moist Heat;Therapeutic activities;Therapeutic exercise;Manual techniques;Patient/family education;Passive range of motion;Dry needling;Vasopneumatic Device    PT Next Visit Plan  RW4, bicep curls.  Modalities and STW/M as needed.       Patient will benefit from skilled therapeutic intervention in order to improve the following deficits and impairments:  Pain, Decreased activity tolerance, Decreased strength  Visit Diagnosis: Chronic right shoulder pain  Chronic  left shoulder pain  Muscle weakness (generalized)     Problem List Patient Active Problem List   Diagnosis Date Noted  . DJD (degenerative joint disease) of knee 09/09/2013  . Insomnia   . Depression with anxiety   . Hypothyroid   . Lymphocytic colitis   . Left knee DJD     Kyren Vaux, [place] 04/18/2019, 2:45 PM  [facility] [place], [place], [address] Phone: [phone]   Fax:  [phone]  Name: Theresa Reese MRN: [id number] Date of Birth: 08/03/1950

## 2019-04-23 ENCOUNTER — Ambulatory Visit: Payer: Medicare PPO | Attending: Family Medicine | Admitting: Physical Therapy

## 2019-04-23 ENCOUNTER — Other Ambulatory Visit: Payer: Self-pay

## 2019-04-23 ENCOUNTER — Encounter: Payer: Self-pay | Admitting: Physical Therapy

## 2019-04-23 DIAGNOSIS — M25512 Pain in left shoulder: Secondary | ICD-10-CM | POA: Diagnosis present

## 2019-04-23 DIAGNOSIS — M6281 Muscle weakness (generalized): Secondary | ICD-10-CM | POA: Diagnosis present

## 2019-04-23 DIAGNOSIS — G8929 Other chronic pain: Secondary | ICD-10-CM | POA: Diagnosis present

## 2019-04-23 DIAGNOSIS — M25511 Pain in right shoulder: Secondary | ICD-10-CM | POA: Insufficient documentation

## 2019-04-23 NOTE — Therapy (Signed)
[facility] [place], [place], [address] Phone: [phone]   Fax:  [phone]  Physical Therapy Treatment  Patient Details  Name: Theresa Reese MRN: [id number] Date of Birth: 1951/03/05 Referring Provider (PT): Cari Caraway MD   Encounter Date: 04/23/2019  PT End of Session - 04/23/19 1443    Visit Number  10    Number of Visits  12    Date for PT Re-Evaluation  04/30/19    Authorization Type  Dash score   43.9 03-28-19    PT Start Time  1429    PT Stop Time  1527    PT Time Calculation (min)  58 min    Activity Tolerance  Patient tolerated treatment well    Behavior During Therapy  [facility] for tasks assessed/performed       Past Medical History:  Diagnosis Date  . Anxiety   . Depression   . Depression with anxiety   . Hypercholesteremia   . Hypothyroid   . Insomnia   . Left knee DJD   . Lymphocytic colitis     Past Surgical History:  Procedure Laterality Date  . CESAREAN SECTION  1987  . CESAREAN SECTION  1990  . COLONOSCOPY    . KNEE ARTHROSCOPY W/ MENISCECTOMY Left 2014   Dr Tonita Cong  . low back surgery  1997   Dr Rita Ohara  . low back surgery  1994   Dr Rolin Barry  . RETINAL DETACHMENT SURGERY Right 2013  . TOTAL KNEE ARTHROPLASTY Left 09/09/2013   Procedure: LEFT TOTAL KNEE ARTHROPLASTY;  Surgeon: Lorn Junes, MD;  Location: [facility];  Service: Orthopedics;  Laterality: Left;    There were no vitals filed for this visit.  Subjective Assessment - 04/23/19 1424    Subjective  COVID 19 screening performed on patient upon arrival. Patient reports L shoulder is still hurting. Probably did too many reps of HEP as she woke early this morning with 10/10    Pertinent History  TKA, lumbar surgery, OA.    Patient Stated Goals  Reduce pain and feel stronger.    Currently in Pain?  Yes    Pain Score  --   No pain rating provided by patient   Pain Location  Shoulder    Pain Orientation  Left    Pain Descriptors /  Indicators  Sore;Discomfort    Pain Type  Chronic pain    Pain Onset  More than a month ago    Pain Frequency  Constant         [facility] PT Assessment - 04/23/19 0001      Assessment   Medical Diagnosis  Bilateral shoulder pain.    Referring Provider (PT)  Cari Caraway MD      Precautions   Precautions  None      Restrictions   Weight Bearing Restrictions  No                   [facility] Adult PT Treatment/Exercise - 04/23/19 0001      Shoulder Exercises: Standing   External Rotation  Strengthening;Both;20 reps;Theraband    Theraband Level (Shoulder External Rotation)  Level 1 (Yellow)    Internal Rotation  Strengthening;Both;Theraband;20 reps    Theraband Level (Shoulder Internal Rotation)  Level 1 (Yellow)    Extension  Strengthening;Both;20 reps;Theraband    Theraband Level (Shoulder Extension)  Level 1 (Yellow)      Shoulder Exercises: ROM/Strengthening   Nustep  Nustep L4 for UE  activity x 12 mins      Modalities   Modalities  Electrical Stimulation;Moist Heat;Ultrasound      Moist Heat Therapy   Number Minutes Moist Heat  15 Minutes    Moist Heat Location  Shoulder      Electrical Stimulation   Electrical Stimulation Location  B shoulder/UT    Electrical Stimulation Action  Pre-Mod    Electrical Stimulation Parameters  80-150 hz x15 min    Electrical Stimulation Goals  Pain      Manual Therapy   Manual Therapy  Soft tissue mobilization    Soft tissue mobilization  STW to L proximal bicep/ UT to reduce pain and tone                  PT Long Term Goals - 04/23/19 1516      PT LONG TERM GOAL #1   Title  Patient will be independent with HEP    Time  6    Period  Weeks    Status  Achieved      PT LONG TERM GOAL #2   Title  Perform ADL's with pain not > 3/10.    Period  Weeks    Status  Partially Met   Achieved for RUE; LUE 5/10 with ADLs.     PT LONG TERM GOAL #3   Title  Reach behind back to L1 with pain not > 2/10.    Time  6     Period  Weeks    Status  Partially Met   Achieved for RUE; lateral hip for L shoulder     PT LONG TERM GOAL #4   Title  Patient will demonstrate proper lifting mechanics to protect spine during functional tasks.    Time  4    Period  Weeks    Status  Achieved            Plan - 04/23/19 0973    Clinical Impression Statement  Patient presented in clinic with greater complaints of L shoulder pain. No complaints of increased pain during therex session. Good B shoulder form noted during therex session. Patient reports compliance with HEP although possibly too compliant and increased reps correlated to increased L shoulder pain this morning. Patient still palpably sore to L proximal bicep. Normal modalities response noted following removal of the modalities.    Personal Factors and Comorbidities  Comorbidity 1;Comorbidity 2    Comorbidities  TKA, lumbar surgery, OA.    Examination-Activity Limitations  Lift    Examination-Participation Restrictions  Other    Stability/Clinical Decision Making  Evolving/Moderate complexity    Rehab Potential  Good    PT Frequency  2x / week    PT Duration  6 weeks    PT Treatment/Interventions  ADLs/Self Care Home Management;Cryotherapy;Electrical Stimulation;Ultrasound;Moist Heat;Therapeutic activities;Therapeutic exercise;Manual techniques;Patient/family education;Passive range of motion;Dry needling;Vasopneumatic Device    PT Next Visit Plan  RW4, bicep curls.  Modalities and STW/M as needed.    Consulted and Agree with Plan of Care  Patient       Patient will benefit from skilled therapeutic intervention in order to improve the following deficits and impairments:  Pain, Decreased activity tolerance, Decreased strength  Visit Diagnosis: Chronic right shoulder pain  Chronic left shoulder pain  Muscle weakness (generalized)     Problem List Patient Active Problem List   Diagnosis Date Noted  . DJD (degenerative joint disease) of knee  09/09/2013  . Insomnia   . Depression with anxiety   .  Hypothyroid   . Lymphocytic colitis   . Left knee DJD     Standley Brooking, PTA 04/23/19 4:17 PM   [facility] [place], [place], [address] Phone: [phone]   Fax:  [phone]  Name: Theresa Reese MRN: [id number] Date of Birth: 1950/12/09  Progress Note Reporting Period 03/19/19 to 04/23/19  See note below for Objective Data and Assessment of Progress/Goals. LTG's #1 and #4 met.    Mali Applegate MPT

## 2019-04-25 ENCOUNTER — Ambulatory Visit: Payer: Medicare PPO | Admitting: Physical Therapy

## 2019-04-25 ENCOUNTER — Other Ambulatory Visit: Payer: Self-pay

## 2019-04-25 DIAGNOSIS — M25511 Pain in right shoulder: Secondary | ICD-10-CM

## 2019-04-25 DIAGNOSIS — M25512 Pain in left shoulder: Secondary | ICD-10-CM

## 2019-04-25 DIAGNOSIS — G8929 Other chronic pain: Secondary | ICD-10-CM

## 2019-04-25 NOTE — Therapy (Signed)
[facility] [place], [place], [address] Phone: [phone]   Fax:  [phone]  Physical Therapy Treatment  Patient Details  Name: Theresa Reese MRN: [id number] Date of Birth: 1950-11-24 Referring Provider (PT): Cari Caraway MD   Encounter Date: 04/25/2019  PT End of Session - 04/25/19 1453    Visit Number  11    Number of Visits  12    Date for PT Re-Evaluation  04/30/19    Authorization Type  Dash score   43.9 03-28-19    PT Start Time  0147    PT Stop Time  0251    PT Time Calculation (min)  64 min    Activity Tolerance  Patient tolerated treatment well    Behavior During Therapy  [facility] for tasks assessed/performed       Past Medical History:  Diagnosis Date  . Anxiety   . Depression   . Depression with anxiety   . Hypercholesteremia   . Hypothyroid   . Insomnia   . Left knee DJD   . Lymphocytic colitis     Past Surgical History:  Procedure Laterality Date  . CESAREAN SECTION  1987  . CESAREAN SECTION  1990  . COLONOSCOPY    . KNEE ARTHROSCOPY W/ MENISCECTOMY Left 2014   Dr Tonita Cong  . low back surgery  1997   Dr Rita Ohara  . low back surgery  1994   Dr Rolin Barry  . RETINAL DETACHMENT SURGERY Right 2013  . TOTAL KNEE ARTHROPLASTY Left 09/09/2013   Procedure: LEFT TOTAL KNEE ARTHROPLASTY;  Surgeon: Lorn Junes, MD;  Location: [facility];  Service: Orthopedics;  Laterality: Left;    There were no vitals filed for this visit.  Subjective Assessment - 04/25/19 1445    Subjective  COVID-19 screen performed prior to patient entering clinic.  Patient wanting dry needling today.  Would like to try on left side of neck.    Pertinent History  TKA, lumbar surgery, OA.    Pain Score  6     Pain Location  Neck    Pain Orientation  Left    Pain Descriptors / Indicators  Aching;Sore    Pain Onset  More than a month ago                       [facility] Adult PT Treatment/Exercise - 04/25/19 0001      Exercises   Exercises  Knee/Hip      Knee/Hip Exercises: Aerobic   Nustep  Level 3 x 10 minutes.      Modalities   Modalities  Electrical Stimulation;Moist Heat;Ultrasound      Moist Heat Therapy   Number Minutes Moist Heat  20 Minutes    Moist Heat Location  --   Left UT.     Acupuncturist Location  Left UT    Electrical Stimulation Action  Pre-mod.    Electrical Stimulation Parameters  80-150 Hz x 20 minutes (5 sec on and 5 sec off).    Electrical Stimulation Goals  Pain      Ultrasound   Ultrasound Location  Left UT.    Ultrasound Parameters  Combo e'stim/U/S at 1.50 W/CM2 x 13 minutes.    Ultrasound Goals  Pain       Trigger Point Dry Needling - 04/25/19 0001    Consent Given?  Yes    Education Handout Provided  Yes    Muscles Treated Head  and Neck  Upper trapezius    Upper Trapezius Response  Twitch reponse elicited                PT Long Term Goals - 04/23/19 1516      PT LONG TERM GOAL #1   Title  Patient will be independent with HEP    Time  6    Period  Weeks    Status  Achieved      PT LONG TERM GOAL #2   Title  Perform ADL's with pain not > 3/10.    Period  Weeks    Status  Partially Met   Achieved for RUE; LUE 5/10 with ADLs.     PT LONG TERM GOAL #3   Title  Reach behind back to L1 with pain not > 2/10.    Time  6    Period  Weeks    Status  Partially Met   Achieved for RUE; lateral hip for L shoulder     PT LONG TERM GOAL #4   Title  Patient will demonstrate proper lifting mechanics to protect spine during functional tasks.    Time  4    Period  Weeks    Status  Achieved            Plan - 04/25/19 1455    Clinical Impression Statement  Excellent response to dry needling of patient's left UT today with excellent twitch response.    Personal Factors and Comorbidities  Comorbidity 1;Comorbidity 2    Comorbidities  TKA, lumbar surgery, OA.    Examination-Activity Limitations  Lift     Examination-Participation Restrictions  Other    Stability/Clinical Decision Making  Evolving/Moderate complexity    Rehab Potential  Good    PT Frequency  2x / week    PT Duration  6 weeks    PT Treatment/Interventions  ADLs/Self Care Home Management;Cryotherapy;Electrical Stimulation;Ultrasound;Moist Heat;Therapeutic activities;Therapeutic exercise;Manual techniques;Patient/family education;Passive range of motion;Dry needling;Vasopneumatic Device    PT Next Visit Plan  RW4, bicep curls.  Modalities and STW/M as needed.       Patient will benefit from skilled therapeutic intervention in order to improve the following deficits and impairments:  Pain, Decreased activity tolerance, Decreased strength  Visit Diagnosis: Chronic right shoulder pain  Chronic left shoulder pain     Problem List Patient Active Problem List   Diagnosis Date Noted  . DJD (degenerative joint disease) of knee 09/09/2013  . Insomnia   . Depression with anxiety   . Hypothyroid   . Lymphocytic colitis   . Left knee DJD     Ivaan Liddy, [place] MPT 04/25/2019, 3:01 PM  [facility] [address] [place], [place], [address] Phone: [phone]   Fax:  [phone]  Name: Theresa Reese MRN: [id number] Date of Birth: 09/04/1950

## 2019-05-02 ENCOUNTER — Encounter: Payer: Self-pay | Admitting: Physical Therapy

## 2019-05-02 ENCOUNTER — Other Ambulatory Visit: Payer: Self-pay

## 2019-05-02 ENCOUNTER — Ambulatory Visit: Payer: Medicare PPO | Admitting: Physical Therapy

## 2019-05-02 DIAGNOSIS — G8929 Other chronic pain: Secondary | ICD-10-CM

## 2019-05-02 DIAGNOSIS — M6281 Muscle weakness (generalized): Secondary | ICD-10-CM

## 2019-05-02 DIAGNOSIS — M25511 Pain in right shoulder: Secondary | ICD-10-CM | POA: Diagnosis not present

## 2019-05-02 NOTE — Therapy (Signed)
[facility] [place], [place], [address] Phone: [phone]   Fax:  [phone]  Physical Therapy Treatment  Patient Details  Name: Theresa Reese MRN: [id number] Date of Birth: 1950-07-28 Referring Provider (PT): Cari Caraway MD   Encounter Date: 05/02/2019  PT End of Session - 05/02/19 1439    Visit Number  12    Number of Visits  12    Date for PT Re-Evaluation  04/30/19    Authorization Type  Dash score   43.9 03-28-19    PT Start Time  1435    PT Stop Time  1521    PT Time Calculation (min)  46 min    Activity Tolerance  Patient tolerated treatment well    Behavior During Therapy  [facility] for tasks assessed/performed       Past Medical History:  Diagnosis Date  . Anxiety   . Depression   . Depression with anxiety   . Hypercholesteremia   . Hypothyroid   . Insomnia   . Left knee DJD   . Lymphocytic colitis     Past Surgical History:  Procedure Laterality Date  . CESAREAN SECTION  1987  . CESAREAN SECTION  1990  . COLONOSCOPY    . KNEE ARTHROSCOPY W/ MENISCECTOMY Left 2014   Dr Tonita Cong  . low back surgery  1997   Dr Rita Ohara  . low back surgery  1994   Dr Rolin Barry  . RETINAL DETACHMENT SURGERY Right 2013  . TOTAL KNEE ARTHROPLASTY Left 09/09/2013   Procedure: LEFT TOTAL KNEE ARTHROPLASTY;  Surgeon: Lorn Junes, MD;  Location: [facility];  Service: Orthopedics;  Laterality: Left;    There were no vitals filed for this visit.  Subjective Assessment - 05/02/19 1431    Subjective  COVID-19 screen performed prior to patient entering clinic. Patient states that the DN helped as pain is not as intense but she is not where wants to be. Patient states that she is starting to have discomfort in R shoulder again as the focus has been L shoulder.    Pertinent History  TKA, lumbar surgery, OA.    Patient Stated Goals  Reduce pain and feel stronger.    Currently in Pain?  Yes    Pain Score  5     Pain Location  Shoulder     Pain Orientation  Left    Pain Descriptors / Indicators  Aching;Sore    Pain Type  Chronic pain    Pain Onset  More than a month ago    Pain Frequency  Constant    Multiple Pain Sites  Yes    Pain Score  2    Pain Location  Shoulder    Pain Orientation  Right    Pain Descriptors / Indicators  Discomfort    Pain Type  Acute pain    Pain Onset  In the past 7 days    Pain Frequency  Intermittent    Aggravating Factors   Movement         [facility] PT Assessment - 05/02/19 0001      Assessment   Medical Diagnosis  Bilateral shoulder pain.    Referring Provider (PT)  Cari Caraway MD    Next MD Visit  TBD      Precautions   Precautions  None      Restrictions   Weight Bearing Restrictions  No  [facility] Adult PT Treatment/Exercise - 05/02/19 0001      Shoulder Exercises: Standing   External Rotation  Strengthening;Both;20 reps;Theraband    Theraband Level (Shoulder External Rotation)  Level 1 (Yellow)    Internal Rotation  Strengthening;Both;Theraband;20 reps    Theraband Level (Shoulder Internal Rotation)  Level 1 (Yellow)      Shoulder Exercises: Pulleys   Flexion  5 minutes      Shoulder Exercises: ROM/Strengthening   UBE (Upper Arm Bike)  90 RPM x6 min      Modalities   Modalities  Electrical Stimulation;Moist Heat;Ultrasound      Moist Heat Therapy   Number Minutes Moist Heat  15 Minutes    Moist Heat Location  Shoulder      Electrical Stimulation   Electrical Stimulation Location  B shoulder    Electrical Stimulation Action  IFC    Electrical Stimulation Parameters  80-150 hz x15 min    Electrical Stimulation Goals  Pain      Ultrasound   Ultrasound Location  L UT    Ultrasound Parameters  Combo 1.5 w/cm2, 100%, 1 mhz x10 min    Ultrasound Goals  Pain                  PT Long Term Goals - 04/23/19 1516      PT LONG TERM GOAL #1   Title  Patient will be independent with HEP    Time  6    Period  Weeks    Status   Achieved      PT LONG TERM GOAL #2   Title  Perform ADL's with pain not > 3/10.    Period  Weeks    Status  Partially Met   Achieved for RUE; LUE 5/10 with ADLs.     PT LONG TERM GOAL #3   Title  Reach behind back to L1 with pain not > 2/10.    Time  6    Period  Weeks    Status  Partially Met   Achieved for RUE; lateral hip for L shoulder     PT LONG TERM GOAL #4   Title  Patient will demonstrate proper lifting mechanics to protect spine during functional tasks.    Time  4    Period  Weeks    Status  Achieved            Plan - 05/02/19 1643    Clinical Impression Statement  Patient presented in clinic with reports of minimal improvement of B shoulder pain. Pain overall rated as less intense since DN but pain still present. Limited goal progression noted as well. Patient remains compliant with HEP. Normal modalities response noted following removal of the modalities.    Personal Factors and Comorbidities  Comorbidity 1;Comorbidity 2    Comorbidities  TKA, lumbar surgery, OA.    Examination-Activity Limitations  Lift    Examination-Participation Restrictions  Other    Stability/Clinical Decision Making  Evolving/Moderate complexity    Rehab Potential  Good    PT Frequency  2x / week    PT Duration  6 weeks    PT Treatment/Interventions  ADLs/Self Care Home Management;Cryotherapy;Electrical Stimulation;Ultrasound;Moist Heat;Therapeutic activities;Therapeutic exercise;Manual techniques;Patient/family education;Passive range of motion;Dry needling;Vasopneumatic Device    PT Next Visit Plan  RW4, bicep curls.  Modalities and STW/M as needed.    Consulted and Agree with Plan of Care  Patient       Patient will benefit from skilled therapeutic intervention in  order to improve the following deficits and impairments:  Pain, Decreased activity tolerance, Decreased strength  Visit Diagnosis: Chronic right shoulder pain  Chronic left shoulder pain  Muscle weakness  (generalized)     Problem List Patient Active Problem List   Diagnosis Date Noted  . DJD (degenerative joint disease) of knee 09/09/2013  . Insomnia   . Depression with anxiety   . Hypothyroid   . Lymphocytic colitis   . Left knee DJD    Standley Brooking, PTA 05/02/19 4:53 PM   [facility] [place], [place], [address] Phone: [phone]   Fax:  [phone]  Name: DACOTA DEVALL MRN: [id number] Date of Birth: 1951-02-27

## 2019-05-17 ENCOUNTER — Encounter (HOSPITAL_BASED_OUTPATIENT_CLINIC_OR_DEPARTMENT_OTHER): Payer: Self-pay | Admitting: Orthopaedic Surgery

## 2019-05-17 ENCOUNTER — Other Ambulatory Visit: Payer: Self-pay

## 2019-05-18 ENCOUNTER — Inpatient Hospital Stay (HOSPITAL_COMMUNITY): Admission: RE | Admit: 2019-05-18 | Payer: Medicare PPO | Source: Ambulatory Visit

## 2019-05-20 ENCOUNTER — Other Ambulatory Visit (HOSPITAL_COMMUNITY)
Admission: RE | Admit: 2019-05-20 | Discharge: 2019-05-20 | Disposition: A | Discharge status: Home / Self Care | Payer: Medicare PPO | Source: Ambulatory Visit | Attending: Orthopaedic Surgery | Admitting: Orthopaedic Surgery

## 2019-05-20 ENCOUNTER — Ambulatory Visit (HOSPITAL_COMMUNITY)
Admission: RE | Admit: 2019-05-20 | Discharge: 2019-05-20 | Disposition: A | Discharge status: Home / Self Care | Payer: Medicare PPO | Source: Ambulatory Visit | Attending: Orthopaedic Surgery | Admitting: Orthopaedic Surgery

## 2019-05-20 ENCOUNTER — Other Ambulatory Visit: Payer: Self-pay

## 2019-05-20 ENCOUNTER — Ambulatory Visit (HOSPITAL_COMMUNITY): Admission: RE | Admit: 2019-05-20 | Payer: Medicare PPO | Source: Ambulatory Visit

## 2019-05-20 ENCOUNTER — Encounter (HOSPITAL_COMMUNITY)
Admission: RE | Admit: 2019-05-20 | Discharge: 2019-05-20 | Disposition: A | Discharge status: Home / Self Care | Payer: Medicare PPO | Source: Ambulatory Visit | Attending: Orthopaedic Surgery | Admitting: Orthopaedic Surgery

## 2019-05-20 DIAGNOSIS — M75102 Unspecified rotator cuff tear or rupture of left shoulder, not specified as traumatic: Secondary | ICD-10-CM | POA: Diagnosis present

## 2019-05-20 DIAGNOSIS — Z79899 Other long term (current) drug therapy: Secondary | ICD-10-CM | POA: Diagnosis not present

## 2019-05-20 DIAGNOSIS — I10 Essential (primary) hypertension: Secondary | ICD-10-CM | POA: Diagnosis not present

## 2019-05-20 DIAGNOSIS — K219 Gastro-esophageal reflux disease without esophagitis: Secondary | ICD-10-CM | POA: Diagnosis not present

## 2019-05-20 DIAGNOSIS — Z01818 Encounter for other preprocedural examination: Secondary | ICD-10-CM | POA: Diagnosis present

## 2019-05-20 DIAGNOSIS — Z87891 Personal history of nicotine dependence: Secondary | ICD-10-CM | POA: Diagnosis not present

## 2019-05-20 DIAGNOSIS — E7800 Pure hypercholesterolemia, unspecified: Secondary | ICD-10-CM | POA: Diagnosis not present

## 2019-05-20 DIAGNOSIS — E039 Hypothyroidism, unspecified: Secondary | ICD-10-CM | POA: Diagnosis not present

## 2019-05-20 DIAGNOSIS — M25812 Other specified joint disorders, left shoulder: Secondary | ICD-10-CM | POA: Diagnosis not present

## 2019-05-20 DIAGNOSIS — M7522 Bicipital tendinitis, left shoulder: Secondary | ICD-10-CM | POA: Diagnosis not present

## 2019-05-20 LAB — SARS CORONAVIRUS 2 (TAT 6-24 HRS): SARS Coronavirus 2: NEGATIVE

## 2019-05-21 NOTE — H&P (Addendum)
PREOPERATIVE H&P  Chief Complaint: LEFT SHOULDER CARTLIAGE DISORDER,  IMPINGEMENT,  BICEP TENDONITIS AND ROTATOR CUFF TEAR  HPI: Theresa Reese is a 69 y.o. female who is scheduled for LEFT SHOULDER ARTHROSCOPY  DEBRIDEMENT WITH SUBACROMIAL DECOMPRESSION AND BICEP TENODESIS LEFT SHOULDER ARTHROSCOPY WITH ROTATOR CUFF REPAIR.   Patient is a healthy 69 year-old retired female who has had pain in her shoulder for about 7 months ago which began when she was doing a lot of yardwork.  Her pain has gradually worsened. She has tried and failed non-operative measures for about seven months now. She is not making much progress  Her symptoms are rated as moderate to severe, and have been worsening.  This is significantly impairing activities of daily living.    Please see clinic note for further details on this patient's care.    She has elected for surgical management.   Past Medical History:  Diagnosis Date  . GERD (gastroesophageal reflux disease)   . Hypercholesteremia   . Hypertension   . Hypothyroid   . Insomnia   . Left knee DJD   . Left shoulder pain   . Lymphocytic colitis    Past Surgical History:  Procedure Laterality Date  . BACK SURGERY    . CESAREAN SECTION  1987  . CESAREAN SECTION  1990  . COLONOSCOPY    . EYE SURGERY Bilateral    cataract  . JOINT REPLACEMENT Left   . KNEE ARTHROSCOPY W/ MENISCECTOMY Left 2014   Dr Shelle Iron  . low back surgery  1997   Dr Jule Ser  . low back surgery  1994   Dr Elesa Hacker  . RETINAL DETACHMENT SURGERY Right 2013  . TOTAL KNEE ARTHROPLASTY Left 09/09/2013   Procedure: LEFT TOTAL KNEE ARTHROPLASTY;  Surgeon: Nilda Simmer, MD;  Location: [facility] OR;  Service: Orthopedics;  Laterality: Left;   Social History   Socioeconomic History  . Marital status: Single    Spouse name: Not on file  . Number of children: Not on file  . Years of education: Not on file  . Highest education level: Not on file  Occupational History  . Not on file    Tobacco Use  . Smoking status: Former Smoker    Quit date: 08/29/1970    Years since quitting: 48.7  . Smokeless tobacco: Never Used  Substance and Sexual Activity  . Alcohol use: Yes    Comment: social  . Drug use: No  . Sexual activity: Yes    Birth control/protection: Post-menopausal  Other Topics Concern  . Not on file  Social History Narrative  . Not on file   Social Determinants of Health   Financial Resource Strain:   . Difficulty of Paying Living Expenses: Not on file  Food Insecurity:   . Worried About Programme researcher, broadcasting/film/video in the Last Year: Not on file  . Ran Out of Food in the Last Year: Not on file  Transportation Needs:   . Lack of Transportation (Medical): Not on file  . Lack of Transportation (Non-Medical): Not on file  Physical Activity:   . Days of Exercise per Week: Not on file  . Minutes of Exercise per Session: Not on file  Stress:   . Feeling of Stress : Not on file  Social Connections:   . Frequency of Communication with Friends and Family: Not on file  . Frequency of Social Gatherings with Friends and Family: Not on file  . Attends Religious Services: Not on file  .  Active Member of Clubs or Organizations: Not on file  . Attends Banker Meetings: Not on file  . Marital Status: Not on file   Family History  Problem Relation Age of Onset  . Heart disease Mother   . Heart attack Mother   . Hypertension Mother   . Heart attack Father   . Hypertension Father   . Stroke Father    Allergies  Allergen Reactions  . Erythromycin     Diarrhea   Hospitalized for dehydration   . Keflex [Cephalexin]     Yeast infection   . Penicillins Rash   Prior to Admission medications   Medication Sig Start Date End Date Taking? Authorizing Provider  atorvastatin (LIPITOR) 20 MG tablet Take 20 mg by mouth daily.   Yes [provider]  b complex vitamins tablet Take 1 tablet by mouth daily.   Yes [provider]   Calcium-Magnesium-Vitamin D (CALCIUM 500 PO) Take 500 mg by mouth daily.   Yes [provider]  Cholecalciferol (VITAMIN D) 2000 UNITS CAPS Take 2,000 Units by mouth daily.   Yes [provider]  conjugated estrogens (PREMARIN) vaginal cream Place 1 Applicatorful vaginally daily.   Yes [provider]  fluticasone (FLONASE) 50 MCG/ACT nasal spray Place into both nostrils daily.   Yes [provider]  ibandronate (BONIVA) 150 MG tablet Take 150 mg by mouth every 30 (thirty) days. Take in the morning with a full glass of water, on an empty stomach, and do not take anything else by mouth or lie down for the next 30 min.   Yes [provider]  levothyroxine (SYNTHROID, LEVOTHROID) 112 MCG tablet Take 112 mcg by mouth daily before breakfast.   Yes [provider]  lisinopril (ZESTRIL) 20 MG tablet Take 20 mg by mouth daily.   Yes [provider]  Multiple Vitamin (MULTIVITAMIN WITH MINERALS) TABS tablet Take 1 tablet by mouth daily.   Yes [provider]  vitamin C (ASCORBIC ACID) 500 MG tablet Take 500 mg by mouth daily.   Yes [provider]  albuterol (VENTOLIN HFA) 108 (90 Base) MCG/ACT inhaler Inhale into the lungs every 6 (six) hours as needed for wheezing or shortness of breath.    [provider]    ROS: All other systems have been reviewed and were otherwise negative with the exception of those mentioned in the HPI and as above.  Physical Exam: General: Alert, no acute distress Cardiovascular: No pedal edema Respiratory: No cyanosis, no use of accessory musculature GI: No organomegaly, abdomen is soft and non-tender Skin: No lesions in the area of chief complaint Neurologic: Sensation intact distally Psychiatric: Patient is competent for consent with normal mood and affect Lymphatic: No axillary or cervical lymphadenopathy  MUSCULOSKELETAL:  Left shoulder: Negative AC tenderness to palpation.  Positive O'Brien's. Positive impingement. Range of motion is significantly limited with 140 forward elevation. She has 4/5 supraspinatus strength.  Imaging: MRI demonstrates a full thickness supraspinatus tear versus a high grade partial thickness tear. She has a Type II acromion. Biceps fluid and fluid around the [facility] joint.  Assessment: Left shoulder likely high grade partial thickness or full thickness supraspinatus tear, subacromion impingement and biceps tendinitis.   Plan: Plan for Procedure(s): LEFT SHOULDER ARTHROSCOPY  DEBRIDEMENT WITH SUBACROMIAL DECOMPRESSION AND BICEP TENODESIS LEFT SHOULDER ARTHROSCOPY WITH ROTATOR CUFF REPAIR  The risks benefits and alternatives were discussed with the patient including but not limited to the risks of nonoperative treatment, versus surgical  intervention including infection, bleeding, nerve injury,  blood clots, cardiopulmonary complications, morbidity, mortality, among others, and they were willing to proceed.   The patient acknowledged the explanation, agreed to proceed with the plan and consent was signed.   Operative Plan: Left shoulder scope with SAD, BT, RCR Discharge Medications: Standard DVT Prophylaxis: None Physical Therapy: Outpatient PT Special Discharge needs: [place], PA-C  05/21/2019 6:59 AM

## 2019-05-22 ENCOUNTER — Ambulatory Visit (HOSPITAL_BASED_OUTPATIENT_CLINIC_OR_DEPARTMENT_OTHER): Payer: Medicare PPO | Admitting: Anesthesiology

## 2019-05-22 ENCOUNTER — Ambulatory Visit (HOSPITAL_BASED_OUTPATIENT_CLINIC_OR_DEPARTMENT_OTHER)
Admission: RE | Admit: 2019-05-22 | Discharge: 2019-05-22 | Disposition: A | Discharge status: Home / Self Care | Payer: Medicare PPO | Source: Ambulatory Visit | Attending: Orthopaedic Surgery | Admitting: Orthopaedic Surgery

## 2019-05-22 ENCOUNTER — Encounter (HOSPITAL_BASED_OUTPATIENT_CLINIC_OR_DEPARTMENT_OTHER): Payer: Self-pay | Admitting: Orthopaedic Surgery

## 2019-05-22 ENCOUNTER — Encounter (HOSPITAL_BASED_OUTPATIENT_CLINIC_OR_DEPARTMENT_OTHER)
Admission: RE | Disposition: A | Discharge status: Home / Self Care | Payer: Self-pay | Source: Ambulatory Visit | Attending: Orthopaedic Surgery

## 2019-05-22 ENCOUNTER — Other Ambulatory Visit: Payer: Self-pay

## 2019-05-22 DIAGNOSIS — E7800 Pure hypercholesterolemia, unspecified: Secondary | ICD-10-CM | POA: Insufficient documentation

## 2019-05-22 DIAGNOSIS — I10 Essential (primary) hypertension: Secondary | ICD-10-CM | POA: Diagnosis not present

## 2019-05-22 DIAGNOSIS — M7522 Bicipital tendinitis, left shoulder: Secondary | ICD-10-CM | POA: Insufficient documentation

## 2019-05-22 DIAGNOSIS — E039 Hypothyroidism, unspecified: Secondary | ICD-10-CM | POA: Insufficient documentation

## 2019-05-22 DIAGNOSIS — Z01811 Encounter for preprocedural respiratory examination: Secondary | ICD-10-CM

## 2019-05-22 DIAGNOSIS — M75102 Unspecified rotator cuff tear or rupture of left shoulder, not specified as traumatic: Secondary | ICD-10-CM | POA: Diagnosis not present

## 2019-05-22 DIAGNOSIS — Z87891 Personal history of nicotine dependence: Secondary | ICD-10-CM | POA: Insufficient documentation

## 2019-05-22 DIAGNOSIS — K219 Gastro-esophageal reflux disease without esophagitis: Secondary | ICD-10-CM | POA: Insufficient documentation

## 2019-05-22 DIAGNOSIS — Z79899 Other long term (current) drug therapy: Secondary | ICD-10-CM | POA: Insufficient documentation

## 2019-05-22 DIAGNOSIS — M25812 Other specified joint disorders, left shoulder: Secondary | ICD-10-CM | POA: Insufficient documentation

## 2019-05-22 HISTORY — PX: SHOULDER ARTHROSCOPY WITH SUBACROMIAL DECOMPRESSION AND BICEP TENDON REPAIR: SHX5689

## 2019-05-22 HISTORY — DX: Essential (primary) hypertension: I10

## 2019-05-22 HISTORY — PX: SHOULDER ARTHROSCOPY WITH ROTATOR CUFF REPAIR: SHX5685

## 2019-05-22 HISTORY — DX: Pain in left shoulder: M25.512

## 2019-05-22 HISTORY — DX: Gastro-esophageal reflux disease without esophagitis: K21.9

## 2019-05-22 SURGERY — SHOULDER ARTHROSCOPY WITH SUBACROMIAL DECOMPRESSION AND BICEP TENDON REPAIR
Anesthesia: General | Site: Shoulder | Laterality: Left

## 2019-05-22 MED ORDER — FENTANYL CITRATE (PF) 100 MCG/2ML IJ SOLN
25.0000 ug | INTRAMUSCULAR | Status: DC | PRN
  Administered 2019-05-22: 25 ug via INTRAVENOUS

## 2019-05-22 MED ORDER — BUPIVACAINE LIPOSOME 1.3 % IJ SUSP
INTRAMUSCULAR | Status: DC | PRN
  Administered 2019-05-22: 10 mL via PERINEURAL

## 2019-05-22 MED ORDER — BUPIVACAINE HCL (PF) 0.5 % IJ SOLN
INTRAMUSCULAR | Status: DC | PRN
  Administered 2019-05-22: 15 mL via PERINEURAL

## 2019-05-22 MED ORDER — LIDOCAINE 2% (20 MG/ML) 5 ML SYRINGE
INTRAMUSCULAR | Status: DC | PRN
  Administered 2019-05-22: 100 mg via INTRAVENOUS

## 2019-05-22 MED ORDER — DEXAMETHASONE SODIUM PHOSPHATE 10 MG/ML IJ SOLN
INTRAMUSCULAR | Status: DC | PRN
  Administered 2019-05-22: 5 mg via INTRAVENOUS

## 2019-05-22 MED ORDER — KETOROLAC TROMETHAMINE 30 MG/ML IJ SOLN: 30.0000 mg | Freq: Once | INTRAMUSCULAR | Status: DC | PRN

## 2019-05-22 MED ORDER — LIDOCAINE 2% (20 MG/ML) 5 ML SYRINGE
INTRAMUSCULAR | Status: AC
  Filled 2019-05-22: qty 5

## 2019-05-22 MED ORDER — PROPOFOL 10 MG/ML IV BOLUS
INTRAVENOUS | Status: AC
  Filled 2019-05-22: qty 20

## 2019-05-22 MED ORDER — ALBUTEROL SULFATE (2.5 MG/3ML) 0.083% IN NEBU
INHALATION_SOLUTION | RESPIRATORY_TRACT | Status: AC
  Filled 2019-05-22: qty 3

## 2019-05-22 MED ORDER — PROMETHAZINE HCL 25 MG/ML IJ SOLN: 6.2500 mg | INTRAMUSCULAR | Status: DC | PRN

## 2019-05-22 MED ORDER — ONDANSETRON HCL 4 MG/2ML IJ SOLN
INTRAMUSCULAR | Status: DC | PRN
  Administered 2019-05-22: 4 mg via INTRAVENOUS

## 2019-05-22 MED ORDER — FENTANYL CITRATE (PF) 100 MCG/2ML IJ SOLN
INTRAMUSCULAR | Status: AC
  Filled 2019-05-22: qty 2

## 2019-05-22 MED ORDER — PHENYLEPHRINE 40 MCG/ML (10ML) SYRINGE FOR IV PUSH (FOR BLOOD PRESSURE SUPPORT)
PREFILLED_SYRINGE | INTRAVENOUS | Status: DC | PRN
  Administered 2019-05-22: 80 ug via INTRAVENOUS
  Administered 2019-05-22 (×2): 40 ug via INTRAVENOUS
  Administered 2019-05-22 (×3): 80 ug via INTRAVENOUS

## 2019-05-22 MED ORDER — PHENYLEPHRINE HCL (PRESSORS) 10 MG/ML IV SOLN
INTRAVENOUS | Status: AC
  Filled 2019-05-22: qty 1

## 2019-05-22 MED ORDER — FENTANYL CITRATE (PF) 100 MCG/2ML IJ SOLN
50.0000 ug | INTRAMUSCULAR | Status: DC | PRN
  Administered 2019-05-22: 100 ug via INTRAVENOUS

## 2019-05-22 MED ORDER — ONDANSETRON HCL 4 MG PO TABS: 4.0000 mg | ORAL_TABLET | Freq: Three times a day (TID) | ORAL | 1 refills | Status: AC | PRN

## 2019-05-22 MED ORDER — EPINEPHRINE PF 1 MG/ML IJ SOLN
INTRAMUSCULAR | Status: AC
  Filled 2019-05-22: qty 4

## 2019-05-22 MED ORDER — PHENYLEPHRINE HCL-NACL 10-0.9 MG/250ML-% IV SOLN
INTRAVENOUS | Status: DC | PRN
  Administered 2019-05-22: 30 ug/min via INTRAVENOUS

## 2019-05-22 MED ORDER — ONDANSETRON HCL 4 MG/2ML IJ SOLN
INTRAMUSCULAR | Status: AC
  Filled 2019-05-22: qty 2

## 2019-05-22 MED ORDER — DEXAMETHASONE SODIUM PHOSPHATE 10 MG/ML IJ SOLN
INTRAMUSCULAR | Status: AC
  Filled 2019-05-22: qty 1

## 2019-05-22 MED ORDER — ALBUTEROL SULFATE (2.5 MG/3ML) 0.083% IN NEBU
2.5000 mg | INHALATION_SOLUTION | Freq: Four times a day (QID) | RESPIRATORY_TRACT | Status: DC | PRN
  Administered 2019-05-22: 2.5 mg via RESPIRATORY_TRACT

## 2019-05-22 MED ORDER — CHLORHEXIDINE GLUCONATE 4 % EX LIQD: 60.0000 mL | Freq: Once | CUTANEOUS | Status: DC

## 2019-05-22 MED ORDER — ROCURONIUM BROMIDE 10 MG/ML (PF) SYRINGE
PREFILLED_SYRINGE | INTRAVENOUS | Status: AC
  Filled 2019-05-22: qty 10

## 2019-05-22 MED ORDER — MELOXICAM 7.5 MG PO TABS: 7.5000 mg | ORAL_TABLET | Freq: Every day | ORAL | 2 refills | Status: AC

## 2019-05-22 MED ORDER — CLINDAMYCIN PHOSPHATE 900 MG/50ML IV SOLN
900.0000 mg | INTRAVENOUS | Status: AC
  Administered 2019-05-22: 900 mg via INTRAVENOUS

## 2019-05-22 MED ORDER — PROPOFOL 10 MG/ML IV BOLUS
INTRAVENOUS | Status: DC | PRN
  Administered 2019-05-22: 130 mg via INTRAVENOUS

## 2019-05-22 MED ORDER — MIDAZOLAM HCL 2 MG/2ML IJ SOLN
1.0000 mg | INTRAMUSCULAR | Status: DC | PRN
  Administered 2019-05-22: 2 mg via INTRAVENOUS

## 2019-05-22 MED ORDER — CLINDAMYCIN PHOSPHATE 900 MG/50ML IV SOLN
INTRAVENOUS | Status: AC
  Filled 2019-05-22: qty 50

## 2019-05-22 MED ORDER — SUGAMMADEX SODIUM 200 MG/2ML IV SOLN
INTRAVENOUS | Status: DC | PRN
  Administered 2019-05-22: 250 mg via INTRAVENOUS

## 2019-05-22 MED ORDER — ROCURONIUM BROMIDE 100 MG/10ML IV SOLN
INTRAVENOUS | Status: DC | PRN
  Administered 2019-05-22: 60 mg via INTRAVENOUS

## 2019-05-22 MED ORDER — OXYCODONE HCL 5 MG PO TABS: ORAL_TABLET | ORAL | 0 refills | Status: AC

## 2019-05-22 MED ORDER — SODIUM CHLORIDE 0.9 % IR SOLN
Status: DC | PRN
  Administered 2019-05-22: 12000 mL

## 2019-05-22 MED ORDER — LACTATED RINGERS IV SOLN: INTRAVENOUS | Status: DC

## 2019-05-22 MED ORDER — MIDAZOLAM HCL 2 MG/2ML IJ SOLN
INTRAMUSCULAR | Status: AC
  Filled 2019-05-22: qty 2

## 2019-05-22 MED ORDER — ACETAMINOPHEN 500 MG PO TABS: 1000.0000 mg | ORAL_TABLET | Freq: Three times a day (TID) | ORAL | 0 refills | Status: AC

## 2019-05-22 SURGICAL SUPPLY — 68 items
AID PSTN UNV HD RSTRNT DISP (MISCELLANEOUS) ×1
ANCH SUT SWLK 19.1X4.75 (Anchor) ×3 IMPLANT
ANCHOR SUT 1.8 FBRTK KNTLS 2SU (Anchor) ×9 IMPLANT
ANCHOR SUT BIO SW 4.75X19.1 (Anchor) ×9 IMPLANT
APL PRP STRL LF DISP 70% ISPRP (MISCELLANEOUS) ×1
BLADE EXCALIBUR 4.0MM X 13CM (MISCELLANEOUS) ×1
BLADE EXCALIBUR 4.0X13 (MISCELLANEOUS) ×2 IMPLANT
BLADE SURG 10 STRL SS (BLADE) IMPLANT
BURR OVAL 8 FLU 4.0MM X 13CM (MISCELLANEOUS) ×1
BURR OVAL 8 FLU 4.0X13 (MISCELLANEOUS) ×2 IMPLANT
CANNULA 5.75X71 LONG (CANNULA) IMPLANT
CANNULA PASSPORT 5 (CANNULA) IMPLANT
CANNULA PASSPORT 5CM (CANNULA)
CANNULA PASSPORT BUTTON 10-40 (CANNULA) ×3 IMPLANT
CANNULA TWIST IN 8.25X7CM (CANNULA) IMPLANT
CHLORAPREP W/TINT 26 (MISCELLANEOUS) ×3 IMPLANT
CLOSURE STERI-STRIP 1/2X4 (GAUZE/BANDAGES/DRESSINGS) ×1
CLSR STERI-STRIP ANTIMIC 1/2X4 (GAUZE/BANDAGES/DRESSINGS) ×2 IMPLANT
COVER WAND RF STERILE (DRAPES) IMPLANT
DECANTER SPIKE VIAL GLASS SM (MISCELLANEOUS) IMPLANT
DISSECTOR 3.5MM X 13CM CVD (MISCELLANEOUS) IMPLANT
DISSECTOR 4.0MMX13CM CVD (MISCELLANEOUS) IMPLANT
DRAPE IMP U-DRAPE 54X76 (DRAPES) ×3 IMPLANT
DRAPE INCISE IOBAN 66X45 STRL (DRAPES) IMPLANT
DRAPE SHOULDER BEACH CHAIR (DRAPES) ×3 IMPLANT
DRSG PAD ABDOMINAL 8X10 ST (GAUZE/BANDAGES/DRESSINGS) ×3 IMPLANT
DW OUTFLOW CASSETTE/TUBE SET (MISCELLANEOUS) ×3 IMPLANT
GAUZE SPONGE 4X4 12PLY STRL (GAUZE/BANDAGES/DRESSINGS) ×3 IMPLANT
GLOVE BIO SURGEON STRL SZ 6.5 (GLOVE) ×2 IMPLANT
GLOVE BIO SURGEONS STRL SZ 6.5 (GLOVE) ×1
GLOVE BIOGEL PI IND STRL 6.5 (GLOVE) ×1 IMPLANT
GLOVE BIOGEL PI IND STRL 8 (GLOVE) ×1 IMPLANT
GLOVE BIOGEL PI INDICATOR 6.5 (GLOVE) ×2
GLOVE BIOGEL PI INDICATOR 8 (GLOVE) ×2
GLOVE ECLIPSE 8.0 STRL XLNG CF (GLOVE) ×3 IMPLANT
GOWN STRL REUS W/ TWL LRG LVL3 (GOWN DISPOSABLE) ×2 IMPLANT
GOWN STRL REUS W/TWL LRG LVL3 (GOWN DISPOSABLE) ×6
GOWN STRL REUS W/TWL XL LVL3 (GOWN DISPOSABLE) ×3 IMPLANT
KIT STABILIZATION SHOULDER (MISCELLANEOUS) ×3 IMPLANT
KIT STR SPEAR 1.8 FBRTK DISP (KITS) ×3 IMPLANT
LASSO CRESCENT QUICKPASS (SUTURE) IMPLANT
MANIFOLD NEPTUNE II (INSTRUMENTS) ×3 IMPLANT
NDL SAFETY ECLIPSE 18X1.5 (NEEDLE) ×1 IMPLANT
NEEDLE HYPO 18GX1.5 SHARP (NEEDLE) ×3
NEEDLE SCORPION MULTI FIRE (NEEDLE) ×3 IMPLANT
PACK ARTHROSCOPY DSU (CUSTOM PROCEDURE TRAY) ×3 IMPLANT
PACK BASIN DAY SURGERY FS (CUSTOM PROCEDURE TRAY) ×3 IMPLANT
PORT APPOLLO RF 90DEGREE MULTI (SURGICAL WAND) IMPLANT
RESTRAINT HEAD UNIVERSAL NS (MISCELLANEOUS) ×3 IMPLANT
SHEET MEDIUM DRAPE 40X70 STRL (DRAPES) IMPLANT
SLEEVE SCD COMPRESS KNEE MED (MISCELLANEOUS) ×3 IMPLANT
SLING ARM FOAM STRAP LRG (SOFTGOODS) IMPLANT
SLING ARM IMMOBILIZER MED (SOFTGOODS) ×3 IMPLANT
SUT FIBERWIRE #2 38 T-5 BLUE (SUTURE)
SUT MNCRL AB 4-0 PS2 18 (SUTURE) ×3 IMPLANT
SUT PDS AB 1 CT  36 (SUTURE) ×3
SUT PDS AB 1 CT 36 (SUTURE) ×1 IMPLANT
SUT TIGER TAPE 7 IN WHITE (SUTURE) IMPLANT
SUTURE FIBERWR #2 38 T-5 BLUE (SUTURE) IMPLANT
SUTURE TAPE TIGERLINK 1.3MM BL (SUTURE) IMPLANT
SUTURETAPE TIGERLINK 1.3MM BL (SUTURE)
SYR 5ML LL (SYRINGE) ×3 IMPLANT
TAPE CLOTH SURG 6X10 WHT LF (GAUZE/BANDAGES/DRESSINGS) ×3 IMPLANT
TAPE FIBER 2MM 7IN #2 BLUE (SUTURE) ×3 IMPLANT
TOWEL GREEN STERILE FF (TOWEL DISPOSABLE) ×3 IMPLANT
TUBE CONNECTING 20'X1/4 (TUBING) ×1
TUBE CONNECTING 20X1/4 (TUBING) ×2 IMPLANT
TUBING ARTHROSCOPY IRRIG 16FT (MISCELLANEOUS) ×3 IMPLANT

## 2019-05-22 NOTE — Discharge Instructions (Signed)
Post Anesthesia Home Care Instructions  Activity: Get plenty of rest for the remainder of the day. A responsible individual must stay with you for 24 hours following the procedure.  For the next 24 hours, DO NOT: -Drive a car -Operate machinery -Drink alcoholic beverages -Take any medication unless instructed by your physician -Make any legal decisions or sign important papers.  Meals: Start with liquid foods such as gelatin or soup. Progress to regular foods as tolerated. Avoid greasy, spicy, heavy foods. If nausea and/or vomiting occur, drink only clear liquids until the nausea and/or vomiting subsides. Call your physician if vomiting continues.  Special Instructions/Symptoms: Your throat may feel dry or sore from the anesthesia or the breathing tube placed in your throat during surgery. If this causes discomfort, gargle with warm salt water. The discomfort should disappear within 24 hours.  If you had a scopolamine patch placed behind your ear for the management of post- operative nausea and/or vomiting:  1. The medication in the patch is effective for 72 hours, after which it should be removed.  Wrap patch in a tissue and discard in the trash. Wash hands thoroughly with soap and water. 2. You may remove the patch earlier than 72 hours if you experience unpleasant side effects which may include dry mouth, dizziness or visual disturbances. 3. Avoid touching the patch. Wash your hands with soap and water after contact with the patch.      Regional Anesthesia Blocks  1. Numbness or the inability to move the "blocked" extremity may last from 3-48 hours after placement. The length of time depends on the medication injected and your individual response to the medication. If the numbness is not going away after 48 hours, call your surgeon.  2. The extremity that is blocked will need to be protected until the numbness is gone and the  Strength has returned. Because you cannot feel it, you  will need to take extra care to avoid injury. Because it may be weak, you may have difficulty moving it or using it. You may not know what position it is in without looking at it while the block is in effect.  3. For blocks in the legs and feet, returning to weight bearing and walking needs to be done carefully. You will need to wait until the numbness is entirely gone and the strength has returned. You should be able to move your leg and foot normally before you try and bear weight or walk. You will need someone to be with you when you first try to ensure you do not fall and possibly risk injury.  4. Bruising and tenderness at the needle site are common side effects and will resolve in a few days.  5. Persistent numbness or new problems with movement should be communicated to the surgeon or the [facility] ([phone])/ [facility] ([phone]).  Information for Discharge Teaching: EXPAREL (bupivacaine liposome injectable suspension)   Your surgeon or anesthesiologist gave you EXPAREL(bupivacaine) to help control your pain after surgery.   EXPAREL is a local anesthetic that provides pain relief by numbing the tissue around the surgical site.  EXPAREL is designed to release pain medication over time and can control pain for up to 72 hours.  Depending on how you respond to EXPAREL, you may require less pain medication during your recovery.  Possible side effects:  Temporary loss of sensation or ability to move in the area where bupivacaine was injected.  Nausea, vomiting, constipation  Rarely,   numbness and tingling in your mouth or lips, lightheadedness, or anxiety may occur.  Call your doctor right away if you think you may be experiencing any of these sensations, or if you have other questions regarding possible side effects.  Follow all other discharge instructions given to you by your surgeon or nurse. Eat a healthy diet and drink plenty of water or other  fluids.  If you return to the hospital for any reason within 96 hours following the administration of EXPAREL, it is important for health care providers to know that you have received this anesthetic. A teal colored band has been placed on your arm with the date, time and amount of EXPAREL you have received in order to alert and inform your health care providers. Please leave this armband in place for the full 96 hours following administration, and then you may remove the band. 

## 2019-05-22 NOTE — Op Note (Signed)
Orthopaedic Surgery Operative Note (CSN: [id number])  Theresa Reese  1950-06-12 Date of Surgery: 05/22/2019   Diagnoses:  Left shoulder rotator cuff tear, impingement and biceps tendinitis  Procedure: Arthroscopic extensive debridement Arthroscopic subacromial decompression Arthroscopic rotator cuff repair Arthroscopic biceps tenodesis   Operative Finding Exam under anesthesia: Full motion no limitation Articular space: No loose bodies, capsule intact, pan labral fraying with a type II SLAP tear, significant debris consistent with previous steroid injections in the soft tissue of the interval.  Additionally in the cuff. Chondral surfaces:Intact, no sign of chondral degeneration on the glenoid or humeral head Biceps: Type II SLAP tear, injection of the tendon insistent with tendinitis Subscapularis: Intact Superior Cuff: Near full-thickness partial thickness tear noted with a small area of full-thickness posterior aspect of the supraspinatus Bursal side: We are able to probe through the small posterior supraspinatus full-thickness tear and there was less than 10% intact bursal sided tissue anteriorly.  This was taken down repaired en bloc.  Successful completion of the planned procedure.  Successful surgery with good fixation.  Great fixation of the biceps.   Post-operative plan: The patient will be non-weightbearing in a sling x6 weeks with therapy to start right after surgery.  The patient will be discharged home.  DVT prophylaxis not indicated in ambulatory upper extremity patient without known risk factors.   Pain control with PRN pain medication preferring oral medicines.  Follow up plan will be scheduled in approximately 7 days for incision check and XR.  Post-Op Diagnosis: Same Surgeons:Primary: Hiram Gash, MD Assistants:Caroline McBane PA-C Location: [facility] OR ROOM 6 Anesthesia: General with Exparel interscalene block Antibiotics: Clindamycin Tourniquet time: None Estimated  Blood Loss: Minimal Complications: None Specimens: None Implants: Implant Name Type Inv. Item Serial No. Manufacturer Lot No. LRB No. Used Action  ANCHOR SUT 1.8 FIBERTAK 2 SUT - [id number] Anchor ANCHOR SUT 1.8 FIBERTAK 2 SUT  [employer] [id number] Left 1 Implanted  ANCHOR SUT 1.8 FIBERTAK 2 SUT - [id number] Anchor ANCHOR SUT 1.8 FIBERTAK 2 SUT  [employer] [id number] Left 1 Implanted  ANCHOR SUT BIO SW 4.75X19.1 - [id number] Anchor ANCHOR SUT BIO SW 4.75X19.1  [employer] [id number] Left 1 Implanted  ANCHOR SUT BIO SW 4.75X19.1 - [id number] Anchor ANCHOR SUT BIO SW 4.75X19.1  [employer] [id number] Left 1 Implanted  ANCHOR SUT BIO SW 4.75X19.1 - [id number] Anchor ANCHOR SUT BIO SW 4.75X19.1  [employer] [id number] Left 1 Implanted  ANCHOR SUT 1.8 FIBERTAK 2 SUT - [id number] Anchor ANCHOR SUT 1.8 FIBERTAK 2 SUT  [employer] [id number] Left 1 Implanted    Indications for Surgery:   Theresa Reese is a 69 y.o. female with continued shoulder pain refractory to nonoperative measures for extended period of time.    The risks and benefits were explained at length including but not limited to continued pain, cuff failure, biceps tenodesis failure, stiffness, need for further surgery and infection.   Procedure:   Patient was correctly identified in the preoperative holding area and operative site marked.  Patient brought to OR and positioned beachchair on an [place] table ensuring that all bony prominences were padded and the head was in an appropriate location.  Anesthesia was induced and the operative shoulder was prepped and draped in the usual sterile fashion.  Timeout was called preincision.  A standard posterior viewing portal was made after localizing the portal with a spinal needle.  An anterior accessory portal was also made.  After clearing the articular space the camera was  positioned in the subacromial space.  Findings above.    Extensive debridement was performed of the anterior interval tissue, labral  fraying and the bursa.  Subacromial decompression: We made a lateral portal with spinal needle guidance. We then proceeded to debride bursal tissue extensively with a shaver and arthrocare device. At that point we continued to identify the borders of the acromion and identify the spur. We then carefully preserved the deltoid fascia and used a burr to convert the acromion to a Type 1 flat acromion without issue.  Arthroscopic Rotator Cuff Repair: Tuberosity was prepared with a burr to a bleeding bed.  Following completion of the above we placed 2 4.7 Swivelock anchor loaded with a tape at inserted at the medial articular margin and an scorpion suture passing device, shuttled  sutures medially in a horizontal mattress suture configuration.  We then tied using arthroscopic knot tying techniques  each suture to its partner reducing the tendon at the prepared insertion site.  The fiber tape was not tied. With a medial row suture limbs then incorporated, 2 anteriorly and  2 posteriorly, into each of two 4.75 PEEK SwiveLock anchors, each placed 8 to 10 mm below the tip of the tuberosity and spanning anterior-posterior width of the tear with care to avoid over tensioning.   Biceps tenodesis: We marked the tendon and then performed a tenotomy and debridement of the stump in the articular space. We then identified the biceps tendon in its groove suprapec with the arthroscope in the lateral portal taking care to move from lateral to medial to avoid injury to the subscapularis. At that point we unroofed the tendon itself and mobilized it. An accessory anterior portal was made in line with the tendon and we grasped it from the anterior superior portal and worked from the accessory anterior portal. Two Fibertak 1.37mm knotless anchors were placed in the groove and the tendon was secured in a luggage loop style fashion with a pass of the limb of suture through the tendon using a scorpion device to avoid pull-through.  Repair  was completed with good tension on the tendon.  Residual stump of the tendon was removed after being resected with a RF ablator.   The incisions were closed with absorbable monocryl and steri strips.  A sterile dressing was placed along with a sling. The patient was awoken from general anesthesia and taken to the PACU in stable condition without complication.   Alfonse Alpers, PA-C, present and scrubbed throughout the case, critical for completion in a timely fashion, and for retraction, instrumentation, closure.

## 2019-05-22 NOTE — Progress Notes (Signed)
Assisted Dr. Rose with left, ultrasound guided, interscalene  block. Side rails up, monitors on throughout procedure. See vital signs in flow sheet. Tolerated Procedure well.  

## 2019-05-22 NOTE — Anesthesia Procedure Notes (Signed)
Anesthesia Regional Block: Interscalene brachial plexus block   Pre-Anesthetic Checklist: ,, timeout performed, Correct Patient, Correct Site, Correct Laterality, Correct Procedure, Correct Position, site marked, Risks and benefits discussed,  Surgical consent,  Pre-op evaluation,  At surgeon's request and post-op pain management  Laterality: Left  Prep: chloraprep       Needles:  Injection technique: Single-shot  Needle Type: Echogenic Needle     Needle Length: 9cm      Additional Needles:   Procedures:,,,, ultrasound used (permanent image in chart),,,,  Narrative:  Start time: 05/22/2019 1:07 PM End time: 05/22/2019 1:15 PM Injection made incrementally with aspirations every 5 mL.  Performed by: Personally  Anesthesiologist: Eilene Ghazi, MD  Additional Notes: Patient tolerated the procedure well without complications

## 2019-05-22 NOTE — Transfer of Care (Signed)
Immediate Anesthesia Transfer of Care Note  Patient: Berneta Sages  Procedure(s) Performed: LEFT SHOULDER ARTHROSCOPY  DEBRIDEMENT WITH SUBACROMIAL DECOMPRESSION AND BICEP TENODESIS (Left Shoulder) LEFT SHOULDER ARTHROSCOPY WITH ROTATOR CUFF REPAIR (Left Shoulder)  Patient Location: PACU  Anesthesia Type:General and Regional  Level of Consciousness: drowsy  Airway & Oxygen Therapy: Patient Spontanous Breathing and Patient connected to face mask oxygen  Post-op Assessment: Report given to RN and Post -op Vital signs reviewed and stable  Post vital signs: Reviewed and stable  Last Vitals:  Vitals Value Taken Time  BP 136/86 05/22/19 1445  Temp    Pulse 82 05/22/19 1447  Resp 17 05/22/19 1447  SpO2 100 % 05/22/19 1447  Vitals shown include unvalidated device data.  Last Pain:  Vitals:   05/22/19 1129  TempSrc: Oral  PainSc: 0-No pain      Patients Stated Pain Goal: 6 (05/22/19 1129)  Complications: No apparent anesthesia complications

## 2019-05-22 NOTE — Anesthesia Procedure Notes (Signed)
Anesthesia Procedure Image    

## 2019-05-22 NOTE — Anesthesia Postprocedure Evaluation (Signed)
Anesthesia Post Note  Patient: Theresa Reese  Procedure(s) Performed: LEFT SHOULDER ARTHROSCOPY  DEBRIDEMENT WITH SUBACROMIAL DECOMPRESSION AND BICEP TENODESIS (Left Shoulder) LEFT SHOULDER ARTHROSCOPY WITH ROTATOR CUFF REPAIR (Left Shoulder)     Patient location during evaluation: PACU Anesthesia Type: General Level of consciousness: awake and alert Pain management: pain level controlled Vital Signs Assessment: post-procedure vital signs reviewed and stable Respiratory status: spontaneous breathing, nonlabored ventilation, respiratory function stable and patient connected to nasal cannula oxygen Cardiovascular status: blood pressure returned to baseline and stable Postop Assessment: no apparent nausea or vomiting Anesthetic complications: no    Last Vitals:  Vitals:   05/22/19 1530 05/22/19 1545  BP:  (!) 145/82  Pulse: 83 96  Resp: 14   Temp:    SpO2: 100% 100%    Last Pain:  Vitals:   05/22/19 1530  TempSrc:   PainSc: 0-No pain                 Charliegh Vasudevan S

## 2019-05-22 NOTE — Anesthesia Procedure Notes (Signed)
Procedure Name: Intubation Date/Time: 05/22/2019 1:33 PM Performed by: Marny Lowenstein, CRNA Pre-anesthesia Checklist: Patient identified, Emergency Drugs available, Suction available and Patient being monitored Patient Re-evaluated:Patient Re-evaluated prior to induction Oxygen Delivery Method: Circle system utilized Preoxygenation: Pre-oxygenation with 100% oxygen Induction Type: IV induction Ventilation: Mask ventilation without difficulty Laryngoscope Size: Miller and 2 Grade View: Grade I Tube type: Oral Number of attempts: 1 Airway Equipment and Method: Patient positioned with wedge pillow and Stylet Placement Confirmation: ETT inserted through vocal cords under direct vision,  positive ETCO2 and breath sounds checked- equal and bilateral Secured at: 21 cm Tube secured with: Tape Dental Injury: Teeth and Oropharynx as per pre-operative assessment

## 2019-05-22 NOTE — Interval H&P Note (Signed)
History and Physical Interval Note:  05/22/2019 12:12 PM  Theresa Reese  has presented today for surgery, with the diagnosis of LEFT SHOULDER CARTLIAGE DISORDER,  IMPINGEMENT,  BICEP TENDONITITS AND ROTATOR CUFF TEAR.  The various methods of treatment have been discussed with the patient and family. After consideration of risks, benefits and other options for treatment, the patient has consented to  Procedure(s): LEFT SHOULDER ARTHROSCOPY  DEBRIDEMENT WITH SUBACROMIAL DECOMPRESSION AND BICEP TENODESIS (Left) LEFT SHOULDER ARTHROSCOPY WITH ROTATOR CUFF REPAIR (Left) as a surgical intervention.  The patient's history has been reviewed, patient examined, no change in status, stable for surgery.  I have reviewed the patient's chart and labs.  Questions were answered to the patient's satisfaction.     Bjorn Pippin

## 2019-05-22 NOTE — Anesthesia Preprocedure Evaluation (Signed)
Anesthesia Evaluation  Patient identified by MRN, date of birth, ID band Patient awake    Reviewed: Allergy & Precautions, NPO status , Patient's Chart, lab work & pertinent test results  Airway Mallampati: II  TM Distance: >3 FB Neck ROM: Full    Dental no notable dental hx.    Pulmonary neg pulmonary ROS, former smoker,    Pulmonary exam normal breath sounds clear to auscultation       Cardiovascular hypertension, Normal cardiovascular exam Rhythm:Regular Rate:Normal     Neuro/Psych negative neurological ROS  negative psych ROS   GI/Hepatic Neg liver ROS, GERD  ,  Endo/Other  Hypothyroidism   Renal/GU negative Renal ROS  negative genitourinary   Musculoskeletal negative musculoskeletal ROS (+)   Abdominal   Peds negative pediatric ROS (+)  Hematology negative hematology ROS (+)   Anesthesia Other Findings   Reproductive/Obstetrics negative OB ROS                             Anesthesia Physical Anesthesia Plan  ASA: II  Anesthesia Plan: General   Post-op Pain Management:  Regional for Post-op pain   Induction: Intravenous  PONV Risk Score and Plan: 3 and Ondansetron, Dexamethasone, Treatment may vary due to age or medical condition and Midazolam  Airway Management Planned: Oral ETT  Additional Equipment:   Intra-op Plan:   Post-operative Plan: Extubation in OR  Informed Consent: I have reviewed the patients History and Physical, chart, labs and discussed the procedure including the risks, benefits and alternatives for the proposed anesthesia with the patient or authorized representative who has indicated his/her understanding and acceptance.     Dental advisory given  Plan Discussed with: CRNA and Surgeon  Anesthesia Plan Comments:         Anesthesia Quick Evaluation

## 2019-06-07 ENCOUNTER — Encounter: Payer: Self-pay | Admitting: Physical Therapy

## 2019-06-07 ENCOUNTER — Other Ambulatory Visit: Payer: Self-pay

## 2019-06-07 ENCOUNTER — Ambulatory Visit: Payer: Medicare PPO | Attending: Orthopaedic Surgery | Admitting: Physical Therapy

## 2019-06-07 DIAGNOSIS — M25512 Pain in left shoulder: Secondary | ICD-10-CM | POA: Insufficient documentation

## 2019-06-07 DIAGNOSIS — M6281 Muscle weakness (generalized): Secondary | ICD-10-CM | POA: Insufficient documentation

## 2019-06-07 DIAGNOSIS — G8929 Other chronic pain: Secondary | ICD-10-CM | POA: Insufficient documentation

## 2019-06-07 DIAGNOSIS — M25511 Pain in right shoulder: Secondary | ICD-10-CM | POA: Insufficient documentation

## 2019-06-07 DIAGNOSIS — M25612 Stiffness of left shoulder, not elsewhere classified: Secondary | ICD-10-CM | POA: Insufficient documentation

## 2019-06-07 NOTE — Therapy (Signed)
[facility] [address] [place], [place], [address] Phone: [phone]   Fax:  [phone]  Physical Therapy Evaluation  Patient Details  Name: REETA KUK MRN: [id number] Date of Birth: 25-Feb-1951 Referring Provider (PT): Ramond Marrow   Encounter Date: 06/07/2019  PT End of Session - 06/07/19 1143    Visit Number  13    Number of Visits  28    Date for PT Re-Evaluation  08/02/19    PT Start Time  1115    PT Stop Time  1210    PT Time Calculation (min)  55 min    Activity Tolerance  Patient tolerated treatment well    Behavior During Therapy  [facility] for tasks assessed/performed       Past Medical History:  Diagnosis Date  . GERD (gastroesophageal reflux disease)   . Hypercholesteremia   . Hypertension   . Hypothyroid   . Insomnia   . Left knee DJD   . Left shoulder pain   . Lymphocytic colitis     Past Surgical History:  Procedure Laterality Date  . BACK SURGERY    . CESAREAN SECTION  1987  . CESAREAN SECTION  1990  . COLONOSCOPY    . EYE SURGERY Bilateral    cataract  . JOINT REPLACEMENT Left   . KNEE ARTHROSCOPY W/ MENISCECTOMY Left 2014   Dr Shelle Iron  . low back surgery  1997   Dr Jule Ser  . low back surgery  1994   Dr Elesa Hacker  . RETINAL DETACHMENT SURGERY Right 2013  . SHOULDER ARTHROSCOPY WITH ROTATOR CUFF REPAIR Left 05/22/2019   Procedure: LEFT SHOULDER ARTHROSCOPY WITH ROTATOR CUFF REPAIR;  Surgeon: Bjorn Pippin, MD;  Location: [facility];  Service: Orthopedics;  Laterality: Left;  . SHOULDER ARTHROSCOPY WITH SUBACROMIAL DECOMPRESSION AND BICEP TENDON REPAIR Left 05/22/2019   Procedure: LEFT SHOULDER ARTHROSCOPY  DEBRIDEMENT WITH SUBACROMIAL DECOMPRESSION AND BICEP TENODESIS;  Surgeon: Bjorn Pippin, MD;  Location: [facility];  Service: Orthopedics;  Laterality: Left;  . TOTAL KNEE ARTHROPLASTY Left 09/09/2013   Procedure: LEFT TOTAL KNEE ARTHROPLASTY;  Surgeon: Nilda Simmer, MD;   [facility];  Service: Orthopedics;  Laterality: Left;    There were no vitals filed for this visit.   Subjective Assessment - 06/07/19 1127    Subjective  COVID-19 screen performed prior to patient entering clinic. Pt s/p L rotator cuff repair, subacominal decompression, bicep tendondesis on 05/22/2019. Pt reporting pain of 4/10 at rest. Pt wearing sling.    Pertinent History  TKA, lumbar surgery, OA.    Patient Stated Goals  Be able to use my arm    Currently in Pain?  Yes    Pain Score  6     Pain Location  Shoulder    Pain Orientation  Left    Pain Descriptors / Indicators  Aching;Throbbing    Pain Type  Surgical pain    Pain Onset  1 to 4 weeks ago    Pain Frequency  Intermittent    Aggravating Factors   movement, hurts worse in the evening and night    Pain Relieving Factors  Tylenol,    Effect of Pain on Daily Activities  difficulty sleeping, unable to bath and dress independently         [facility] PT Assessment - 06/07/19 0001      Assessment   Medical Diagnosis  s/p L shoulder debridement with subacrominal decompression, bicep tendodesis, rotator cuff repair  Referring Provider (PT)  Griffin Basil, Dax    Onset Date/Surgical Date  05/22/19    Hand Dominance  Right    Prior Therapy  yes, for shoulders and knee      Precautions   Precautions  Shoulder    Type of Shoulder Precautions  Dr. Rich Fuchs protocol under media    Required Braces or Orthoses  Sling   6 weeks     Balance Screen   Has the patient fallen in the past 6 months  No    Is the patient reluctant to leave their home because of a fear of falling?   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,      Cognition   Overall Cognitive Status  Within Functional Limits for tasks assessed      Observation/Other Assessments   Focus on Therapeutic  Outcomes (FOTO)   96% limited      Posture/Postural Control   Posture/Postural Control  Postural limitations    Postural Limitations  Rounded Shoulders;Forward head      ROM / Strength   AROM / PROM / Strength  PROM      PROM   Overall PROM   Deficits    PROM Assessment Site  Shoulder    Right/Left Shoulder  Left    Left Shoulder Extension  10 Degrees    Left Shoulder Flexion  65 Degrees    Left Shoulder ABduction  15 Degrees    Left Shoulder External Rotation  30 Degrees      Strength   Overall Strength Comments  R grip: 55 ppsi, L: 32 ppsi      Palpation   Palpation comment  TTP: L anterior/ posterior shoulder      Ambulation/Gait   Gait Pattern  Step-through pattern;Decreased arm swing - left    Gait Comments  decreased arm swing L UE due ot sling, limting trunk rotation                Objective measurements completed on examination: See above findings.              PT Education - 06/07/19 1131    Education Details  PT POC, HEP    Person(s) Educated  Patient    Methods  Explanation;Demonstration;Other (comment)    Comprehension  Verbalized understanding;Returned demonstration          PT Long Term Goals - 06/07/19 1245      PT LONG TERM GOAL #1   Title  Patient will be independent with HEP    Time  8    Period  Weeks    Status  New      PT LONG TERM GOAL #2   Title  Perform basci ADL's with pain </= 3/10.    Baseline  unable to perform ADL's currently    Time  8    Period  Weeks    Status  New      PT LONG TERM GOAL #3   Title  Pt will improve her L shoulder flexion >/= 140 degrees.    Baseline  see flowsheets    Time  8    Period  Weeks    Status  New      PT LONG TERM GOAL #4  Title  Pt will improve her left ER to 40 degrees (limited by protocol).    Baseline  see slow sheets    Time  8    Period  Weeks    Status  New      PT LONG TERM GOAL #5   Title  pt will improve grip strength on L hand to >/= 50 ppsi.     Baseline  32 ppsi    Time  8    Period  Weeks    Status  New             Plan - 06/07/19 1238    Clinical Impression Statement  Pt arriving to therapy s/p L rotator cuff repair, sub acrominal decrompression, bicep tendodesis. Pt reporting pain of 6/10 at rest. Pt wearing sling. Reviewed Dr. Austin Miles protocol. Pt was edu in pendulums, wrist and grip strength and gentle elbow ROM. PROM limited due to pain. Pt with decreased grip in L (32ppsi) compared to R( 55 ppsi). Pt reportring less pain after Vasopneumatic treatment. Skilled PT needed to address pt's impairments following pt's protocol with the below interventions.    Personal Factors and Comorbidities  Comorbidity 1;Comorbidity 2    Comorbidities  TKA, lumbar surgery, OA.    Examination-Activity Limitations  Lift;Carry;Dressing;Caring for Others;Sleep    Examination-Participation Restrictions  Other    Stability/Clinical Decision Making  Stable/Uncomplicated    Clinical Decision Making  Low    Rehab Potential  Good    PT Frequency  2x / week    PT Duration  8 weeks    PT Treatment/Interventions  ADLs/Self Care Home Management;Cryotherapy;Electrical Stimulation;Ultrasound;Moist Heat;Therapeutic activities;Therapeutic exercise;Manual techniques;Patient/family education;Passive range of motion;Dry needling;Vasopneumatic Device;Taping;Functional mobility training    PT Next Visit Plan  Pendulums, grip strength, PROM, Vasopenumatic    PT Home Exercise Plan  pendulums, ball squeezes    Consulted and Agree with Plan of Care  Patient       Patient will benefit from skilled therapeutic intervention in order to improve the following deficits and impairments:  Pain, Postural dysfunction, Impaired UE functional use, Decreased activity tolerance, Decreased strength, Decreased range of motion, Increased edema  Visit Diagnosis: Acute pain of right shoulder  Muscle weakness (generalized)  Stiffness of left shoulder, not elsewhere  classified     Problem List Patient Active Problem List   Diagnosis Date Noted  . DJD (degenerative joint disease) of knee 09/09/2013  . Insomnia   . Depression with anxiety   . Hypothyroid   . Lymphocytic colitis   . Left knee DJD     Sharmon Leyden, MPT 06/07/2019, 12:50 PM  [facility] [address] [place], [place], [address] Phone: [phone]   Fax:  [phone]  Name: DEMESHA BOORMAN MRN: [id number] Date of Birth: Dec 26, 1950

## 2019-06-11 ENCOUNTER — Ambulatory Visit: Payer: Medicare PPO | Admitting: *Deleted

## 2019-06-11 ENCOUNTER — Other Ambulatory Visit: Payer: Self-pay

## 2019-06-11 DIAGNOSIS — M25511 Pain in right shoulder: Secondary | ICD-10-CM | POA: Diagnosis not present

## 2019-06-11 DIAGNOSIS — M25612 Stiffness of left shoulder, not elsewhere classified: Secondary | ICD-10-CM

## 2019-06-11 DIAGNOSIS — M6281 Muscle weakness (generalized): Secondary | ICD-10-CM

## 2019-06-11 NOTE — Therapy (Signed)
[facility] [address] [place], [place], [address] Phone: [phone]   Fax:  [phone]  Physical Therapy Treatment  Patient Details  Name: Theresa Reese MRN: [id number] Date of Birth: 07-Dec-1950 Referring Provider (PT): Ramond Marrow   Encounter Date: 06/11/2019  PT End of Session - 06/11/19 1523    Visit Number  2    Number of Visits  16    Date for PT Re-Evaluation  08/02/19    PT Start Time  1430    PT Stop Time  1521    PT Time Calculation (min)  51 min       Past Medical History:  Diagnosis Date  . GERD (gastroesophageal reflux disease)   . Hypercholesteremia   . Hypertension   . Hypothyroid   . Insomnia   . Left knee DJD   . Left shoulder pain   . Lymphocytic colitis     Past Surgical History:  Procedure Laterality Date  . BACK SURGERY    . CESAREAN SECTION  1987  . CESAREAN SECTION  1990  . COLONOSCOPY    . EYE SURGERY Bilateral    cataract  . JOINT REPLACEMENT Left   . KNEE ARTHROSCOPY W/ MENISCECTOMY Left 2014   Dr Shelle Iron  . low back surgery  1997   Dr Jule Ser  . low back surgery  1994   Dr Elesa Hacker  . RETINAL DETACHMENT SURGERY Right 2013  . SHOULDER ARTHROSCOPY WITH ROTATOR CUFF REPAIR Left 05/22/2019   Procedure: LEFT SHOULDER ARTHROSCOPY WITH ROTATOR CUFF REPAIR;  Surgeon: Bjorn Pippin, MD;  Location: [facility];  Service: Orthopedics;  Laterality: Left;  . SHOULDER ARTHROSCOPY WITH SUBACROMIAL DECOMPRESSION AND BICEP TENDON REPAIR Left 05/22/2019   Procedure: LEFT SHOULDER ARTHROSCOPY  DEBRIDEMENT WITH SUBACROMIAL DECOMPRESSION AND BICEP TENODESIS;  Surgeon: Bjorn Pippin, MD;  Location: [facility];  Service: Orthopedics;  Laterality: Left;  . 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  Subjective  Assessment - 06/11/19 1434    Subjective  COVID-19 screen performed prior to patient entering clinic. Pt s/p L rotator cuff repair, subacominal decompression, bicep tendondesis on 05/22/2019.LT shldr is 6/10 today    Pertinent History  TKA, lumbar surgery, OA.    Patient Stated Goals  Be able to use my arm    Currently in Pain?  Yes    Pain Score  6     Pain Location  Shoulder    Pain Orientation  Left    Pain Descriptors / Indicators  Aching;Sore;Throbbing    Pain Onset  1 to 4 weeks ago                       [facility] Adult PT Treatment/Exercise - 06/11/19 0001      Modalities   Modalities  Vasopneumatic      Vasopneumatic   Number Minutes Vasopneumatic   15 minutes    Vasopnuematic Location   Shoulder    Vasopneumatic Pressure  Low    Vasopneumatic Temperature   36      Manual Therapy   Manual Therapy  Passive ROM    Passive ROM  PROM to LT shldr in supine for ER , flexion with oscillations b/w bouts for relation/ decrease pain  PT Long Term Goals - 06/07/19 1245      PT LONG TERM GOAL #1   Title  Patient will be independent with HEP    Time  8    Period  Weeks    Status  New      PT LONG TERM GOAL #2   Title  Perform basci ADL's with pain </= 3/10.    Baseline  unable to perform ADL's currently    Time  8    Period  Weeks    Status  New      PT LONG TERM GOAL #3   Title  Pt will improve her L shoulder flexion >/= 140 degrees.    Baseline  see flowsheets    Time  8    Period  Weeks    Status  New      PT LONG TERM GOAL #4   Title  Pt will improve her left ER to 40 degrees (limited by protocol).    Baseline  see slow sheets    Time  8    Period  Weeks    Status  New      PT LONG TERM GOAL #5   Title  pt will improve grip strength on L hand to >/= 50 ppsi.    Baseline  32 ppsi    Time  8    Period  Weeks    Status  New            Plan - 06/11/19 1437    Clinical Impression Statement  Pt arrived today in  sling LT UEand reports some pain with pendulums. Pendulums reviewed and advised Pt to bend over further to help with ROM. PROM was performed, but Pt needing V/Cs and ocsillations to help with relaxtion due to very guarded. PROM flexion to 80 degrees and ER to 20 degrees. Pt did well with Vaso with decreased pain    Personal Factors and Comorbidities  Comorbidity 1;Comorbidity 2    Comorbidities  TKA, lumbar surgery, OA.    Examination-Activity Limitations  Lift;Carry;Dressing;Caring for Others;Sleep    PT Frequency  2x / week    PT Duration  8 weeks    PT Treatment/Interventions  ADLs/Self Care Home Management;Cryotherapy;Electrical Stimulation;Ultrasound;Moist Heat;Therapeutic activities;Therapeutic exercise;Manual techniques;Patient/family education;Passive range of motion;Dry needling;Vasopneumatic Device;Taping;Functional mobility training    PT Next Visit Plan  Pendulums, grip strength, PROM, Vasopenumatic    PT Home Exercise Plan  pendulums, ball squeezes       Patient will benefit from skilled therapeutic intervention in order to improve the following deficits and impairments:  Pain, Postural dysfunction, Impaired UE functional use, Decreased activity tolerance, Decreased strength, Decreased range of motion, Increased edema  Visit Diagnosis: Acute pain of right shoulder  Muscle weakness (generalized)  Stiffness of left shoulder, not elsewhere classified     Problem List Patient Active Problem List   Diagnosis Date Noted  . DJD (degenerative joint disease) of knee 09/09/2013  . Insomnia   . Depression with anxiety   . Hypothyroid   . Lymphocytic colitis   . Left knee DJD     Amal Renbarger,[name], PTA 06/11/2019, 3:44 PM  [facility] [address] [place], [place], [address] Phone: [phone]   Fax:  [phone]  Name: Theresa Reese MRN: [id number] Date of Birth: [birth year]

## 2019-06-13 ENCOUNTER — Other Ambulatory Visit: Payer: Self-pay

## 2019-06-13 ENCOUNTER — Ambulatory Visit: Payer: Medicare PPO | Admitting: *Deleted

## 2019-06-13 DIAGNOSIS — M25612 Stiffness of left shoulder, not elsewhere classified: Secondary | ICD-10-CM

## 2019-06-13 DIAGNOSIS — M6281 Muscle weakness (generalized): Secondary | ICD-10-CM

## 2019-06-13 DIAGNOSIS — M25511 Pain in right shoulder: Secondary | ICD-10-CM | POA: Diagnosis not present

## 2019-06-13 NOTE — Therapy (Signed)
[facility] [address] [place], [place], [address] Phone: [phone]   Fax:  [phone]  Physical Therapy Treatment  Patient Details  Name: Theresa Reese MRN: [id number] Date of Birth: 1950/08/31 Referring Provider (PT): Ramond Marrow   Encounter Date: 06/13/2019  PT End of Session - 06/13/19 1825    Visit Number  3    Number of Visits  16    Date for PT Re-Evaluation  08/02/19    PT Start Time  1430    PT Stop Time  1521    PT Time Calculation (min)  51 min       Past Medical History:  Diagnosis Date  . GERD (gastroesophageal reflux disease)   . Hypercholesteremia   . Hypertension   . Hypothyroid   . Insomnia   . Left knee DJD   . Left shoulder pain   . Lymphocytic colitis     Past Surgical History:  Procedure Laterality Date  . BACK SURGERY    . CESAREAN SECTION  1987  . CESAREAN SECTION  1990  . COLONOSCOPY    . EYE SURGERY Bilateral    cataract  . JOINT REPLACEMENT Left   . KNEE ARTHROSCOPY W/ MENISCECTOMY Left 2014   Dr Shelle Iron  . low back surgery  1997   Dr Jule Ser  . low back surgery  1994   Dr Elesa Hacker  . RETINAL DETACHMENT SURGERY Right 2013  . SHOULDER ARTHROSCOPY WITH ROTATOR CUFF REPAIR Left 05/22/2019   Procedure: LEFT SHOULDER ARTHROSCOPY WITH ROTATOR CUFF REPAIR;  Surgeon: Bjorn Pippin, MD;  Location: [facility];  Service: Orthopedics;  Laterality: Left;  . SHOULDER ARTHROSCOPY WITH SUBACROMIAL DECOMPRESSION AND BICEP TENDON REPAIR Left 05/22/2019   Procedure: LEFT SHOULDER ARTHROSCOPY  DEBRIDEMENT WITH SUBACROMIAL DECOMPRESSION AND BICEP TENODESIS;  Surgeon: Bjorn Pippin, MD;  Location: [facility];  Service: Orthopedics;  Laterality: Left;  . 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  Subjective  Assessment - 06/13/19 1827    Subjective  COVID-19 screen performed prior to patient entering clinic.  Very sore after last Rx    Pertinent History  TKA, lumbar surgery, OA.    Patient Stated Goals  Be able to use my arm    Currently in Pain?  Yes    Pain Score  6     Pain Location  Shoulder    Pain Orientation  Left    Pain Type  Surgical pain    Pain Onset  1 to 4 weeks ago    Pain Frequency  Intermittent                       [facility] Adult PT Treatment/Exercise - 06/13/19 0001      Exercises   Exercises  Shoulder      Shoulder Exercises: Supine   Other Supine Exercises  instructed in table flexion stretch and cane ER stretch for HEP      Vasopneumatic   Number Minutes Vasopneumatic   15 minutes    Vasopnuematic Location   Shoulder    Vasopneumatic Pressure  Low    Vasopneumatic Temperature   36      Manual Therapy   Manual Therapy  Passive ROM    Passive ROM  PROM to  LT shldr in supine for ER , flexion with oscillations b/w bouts for relation/ decrease pain                  PT Long Term Goals - 06/07/19 1245      PT LONG TERM GOAL #1   Title  Patient will be independent with HEP    Time  8    Period  Weeks    Status  New      PT LONG TERM GOAL #2   Title  Perform basci ADL's with pain </= 3/10.    Baseline  unable to perform ADL's currently    Time  8    Period  Weeks    Status  New      PT LONG TERM GOAL #3   Title  Pt will improve her L shoulder flexion >/= 140 degrees.    Baseline  see flowsheets    Time  8    Period  Weeks    Status  New      PT LONG TERM GOAL #4   Title  Pt will improve her left ER to 40 degrees (limited by protocol).    Baseline  see slow sheets    Time  8    Period  Weeks    Status  New      PT LONG TERM GOAL #5   Title  pt will improve grip strength on L hand to >/= 50 ppsi.    Baseline  32 ppsi    Time  8    Period  Weeks    Status  New            Plan - 06/13/19 1829    Clinical  Impression Statement  Pt arrived with pain and soreness LT shldr 6/10. She did fair with PROM today and was able to reach 100 degrees of flexion today and 30 degrees of ER. Normal modality response today    Examination-Activity Limitations  Lift;Carry;Dressing;Caring for Others;Sleep    Examination-Participation Restrictions  Other    Stability/Clinical Decision Making  Stable/Uncomplicated    Rehab Potential  Good    PT Frequency  2x / week    PT Duration  8 weeks    PT Treatment/Interventions  ADLs/Self Care Home Management;Cryotherapy;Electrical Stimulation;Ultrasound;Moist Heat;Therapeutic activities;Therapeutic exercise;Manual techniques;Patient/family education;Passive range of motion;Dry needling;Vasopneumatic Device;Taping;Functional mobility training    PT Next Visit Plan  Pendulums, grip strength, PROM, Vasopenumatic       Patient will benefit from skilled therapeutic intervention in order to improve the following deficits and impairments:  Pain, Postural dysfunction, Impaired UE functional use, Decreased activity tolerance, Decreased strength, Decreased range of motion, Increased edema  Visit Diagnosis: Muscle weakness (generalized)  Stiffness of left shoulder, not elsewhere classified     Problem List Patient Active Problem List   Diagnosis Date Noted  . DJD (degenerative joint disease) of knee 09/09/2013  . Insomnia   . Depression with anxiety   . Hypothyroid   . Lymphocytic colitis   . Left knee DJD     Delron Comer,[name], PTA 06/13/2019, 6:33 PM  [facility] [address] [place], [place], [address] Phone: [phone]   Fax:  [phone]  Name: Theresa Reese MRN: [id number] Date of Birth: September 13, 1950

## 2019-06-18 ENCOUNTER — Ambulatory Visit: Payer: Medicare PPO | Admitting: *Deleted

## 2019-06-18 ENCOUNTER — Other Ambulatory Visit: Payer: Self-pay

## 2019-06-18 DIAGNOSIS — M25612 Stiffness of left shoulder, not elsewhere classified: Secondary | ICD-10-CM

## 2019-06-18 DIAGNOSIS — M6281 Muscle weakness (generalized): Secondary | ICD-10-CM

## 2019-06-18 DIAGNOSIS — G8929 Other chronic pain: Secondary | ICD-10-CM

## 2019-06-18 DIAGNOSIS — M25511 Pain in right shoulder: Secondary | ICD-10-CM | POA: Diagnosis not present

## 2019-06-18 NOTE — Therapy (Signed)
[facility] [place], [place], [address] Phone: [phone]   Fax:  [phone]  Physical Therapy Treatment  Patient Details  Name: Theresa Reese MRN: [id number] Date of Birth: 1950-09-11 Referring Provider (PT): Ophelia Charter   Encounter Date: 06/18/2019  PT End of Session - 06/18/19 1438    Visit Number  4    Number of Visits  16    Date for PT Re-Evaluation  08/02/19    Authorization Type  Dash score   43.9 03-28-19    PT Start Time  1430    PT Stop Time  1524    PT Time Calculation (min)  54 min       Past Medical History:  Diagnosis Date  . GERD (gastroesophageal reflux disease)   . Hypercholesteremia   . Hypertension   . Hypothyroid   . Insomnia   . Left knee DJD   . Left shoulder pain   . Lymphocytic colitis     Past Surgical History:  Procedure Laterality Date  . BACK SURGERY    . CESAREAN SECTION  1987  . CESAREAN SECTION  1990  . COLONOSCOPY    . EYE SURGERY Bilateral    cataract  . JOINT REPLACEMENT Left   . KNEE ARTHROSCOPY W/ MENISCECTOMY Left 2014   Dr Tonita Cong  . low back surgery  1997   Dr Rita Ohara  . low back surgery  1994   Dr Rolin Barry  . RETINAL DETACHMENT SURGERY Right 2013  . SHOULDER ARTHROSCOPY WITH ROTATOR CUFF REPAIR Left 05/22/2019   Procedure: LEFT SHOULDER ARTHROSCOPY WITH ROTATOR CUFF REPAIR;  Surgeon: Hiram Gash, MD;  Location: [place];  Service: Orthopedics;  Laterality: Left;  . SHOULDER ARTHROSCOPY WITH SUBACROMIAL DECOMPRESSION AND BICEP TENDON REPAIR Left 05/22/2019   Procedure: LEFT SHOULDER ARTHROSCOPY  DEBRIDEMENT WITH SUBACROMIAL DECOMPRESSION AND BICEP TENODESIS;  Surgeon: Hiram Gash, MD;  Location: [place];  Service: Orthopedics;  Laterality: Left;  . TOTAL KNEE ARTHROPLASTY Left 09/09/2013   Procedure: LEFT TOTAL KNEE ARTHROPLASTY;  Surgeon: Lorn Junes, MD;  Location: [place];  Service: Orthopedics;  Laterality: Left;    There  were no vitals filed for this visit.  Subjective Assessment - 06/18/19 1437    Subjective  COVID-19 screen performed prior to patient entering clinic.  Very sore after last Rx and pain today 6/10    Pertinent History  TKA, lumbar surgery, OA.    Patient Stated Goals  Be able to use my arm    Currently in Pain?  Yes    Pain Score  6     Pain Location  Shoulder    Pain Orientation  Left    Pain Descriptors / Indicators  Aching;Sore;Throbbing    Pain Type  Surgical pain    Pain Onset  1 to 4 weeks ago                       [facility] Adult PT Treatment/Exercise - 06/18/19 0001      Exercises   Exercises  Shoulder      Modalities   Modalities  Vasopneumatic      Vasopneumatic   Number Minutes Vasopneumatic   15 minutes    Vasopnuematic Location   Shoulder    Vasopneumatic Pressure  Low    Vasopneumatic Temperature   36      Manual Therapy   Manual Therapy  Passive ROM    Passive  ROM  PROM to LT shldr in supine for ER , flexion with oscillations b/w bouts for relation/ decrease pain                  PT Long Term Goals - 06/07/19 1245      PT LONG TERM GOAL #1   Title  Patient will be independent with HEP    Time  8    Period  Weeks    Status  New      PT LONG TERM GOAL #2   Title  Perform basci ADL's with pain </= 3/10.    Baseline  unable to perform ADL's currently    Time  8    Period  Weeks    Status  New      PT LONG TERM GOAL #3   Title  Pt will improve her L shoulder flexion >/= 140 degrees.    Baseline  see flowsheets    Time  8    Period  Weeks    Status  New      PT LONG TERM GOAL #4   Title  Pt will improve her left ER to 40 degrees (limited by protocol).    Baseline  see slow sheets    Time  8    Period  Weeks    Status  New      PT LONG TERM GOAL #5   Title  pt will improve grip strength on L hand to >/= 50 ppsi.    Baseline  32 ppsi    Time  8    Period  Weeks    Status  New            Plan - 06/18/19 1617     Clinical Impression Statement  Pt arrived today with pain 6/10 LT shldr and reports it is hard to relax her shldr. Stays tight. PROM performed with vc's and oscillations used to help Pt relax. Passive flexion to 105 degrees  and ER 40 DEGRES, but farly tight with mm guarding still. Pt has a difficult time relaxing.. Normalmodality response    Comorbidities  TKA, lumbar surgery, OA.    Examination-Activity Limitations  Lift;Carry;Dressing;Caring for Others;Sleep    Stability/Clinical Decision Making  Stable/Uncomplicated    Rehab Potential  Good    PT Frequency  2x / week    PT Duration  8 weeks    PT Treatment/Interventions  ADLs/Self Care Home Management;Cryotherapy;Electrical Stimulation;Ultrasound;Moist Heat;Therapeutic activities;Therapeutic exercise;Manual techniques;Patient/family education;Passive range of motion;Dry needling;Vasopneumatic Device;Taping;Functional mobility training    PT Next Visit Plan  Pendulums, grip strength, PROM, Vasopenumatic    PT Home Exercise Plan  pendulums, ball squeezes    Consulted and Agree with Plan of Care  Patient       Patient will benefit from skilled therapeutic intervention in order to improve the following deficits and impairments:  Pain, Postural dysfunction, Impaired UE functional use, Decreased activity tolerance, Decreased strength, Decreased range of motion, Increased edema  Visit Diagnosis: Muscle weakness (generalized)  Stiffness of left shoulder, not elsewhere classified  Chronic left shoulder pain     Problem List Patient Active Problem List   Diagnosis Date Noted  . DJD (degenerative joint disease) of knee 09/09/2013  . Insomnia   . Depression with anxiety   . Hypothyroid   . Lymphocytic colitis   . Left knee DJD     Justyce Baby,[name], PTA 06/18/2019, 6:06 PM  [facility] [address] [place], [place], [address]  Phone: [phone]   Fax:  [phone]  Name: Theresa Reese MRN:  [id number] Date of Birth: 1950-04-25

## 2019-06-20 ENCOUNTER — Other Ambulatory Visit: Payer: Self-pay

## 2019-06-20 ENCOUNTER — Ambulatory Visit: Payer: Medicare PPO | Attending: Orthopaedic Surgery | Admitting: *Deleted

## 2019-06-20 DIAGNOSIS — M25612 Stiffness of left shoulder, not elsewhere classified: Secondary | ICD-10-CM | POA: Diagnosis present

## 2019-06-20 DIAGNOSIS — G8929 Other chronic pain: Secondary | ICD-10-CM | POA: Insufficient documentation

## 2019-06-20 DIAGNOSIS — M5442 Lumbago with sciatica, left side: Secondary | ICD-10-CM | POA: Insufficient documentation

## 2019-06-20 DIAGNOSIS — M25512 Pain in left shoulder: Secondary | ICD-10-CM | POA: Diagnosis present

## 2019-06-20 DIAGNOSIS — M25511 Pain in right shoulder: Secondary | ICD-10-CM | POA: Insufficient documentation

## 2019-06-20 DIAGNOSIS — M6281 Muscle weakness (generalized): Secondary | ICD-10-CM | POA: Diagnosis present

## 2019-06-20 NOTE — Therapy (Signed)
[facility] [address] [place], [place], [address] Phone: [phone]   Fax:  [phone]  Physical Therapy Treatment  Patient Details  Name: Theresa Reese MRN: [id number] Date of Birth: [birth year] Referring Provider (PT): Ramond Marrow   Encounter Date: 06/20/2019  PT End of Session - 06/20/19 1637    Visit Number  5    Number of Visits  16    Date for PT Re-Evaluation  08/02/19    Authorization Type  Dash score   43.9 03-28-19    PT Start Time  1430    PT Stop Time  1523    PT Time Calculation (min)  53 min       Past Medical History:  Diagnosis Date  . GERD (gastroesophageal reflux disease)   . Hypercholesteremia   . Hypertension   . Hypothyroid   . Insomnia   . Left knee DJD   . Left shoulder pain   . Lymphocytic colitis     Past Surgical History:  Procedure Laterality Date  . BACK SURGERY    . CESAREAN SECTION  1987  . CESAREAN SECTION  1990  . COLONOSCOPY    . EYE SURGERY Bilateral    cataract  . JOINT REPLACEMENT Left   . KNEE ARTHROSCOPY W/ MENISCECTOMY Left 2014   Dr Shelle Iron  . low back surgery  1997   Dr Jule Ser  . low back surgery  1994   Dr Elesa Hacker  . RETINAL DETACHMENT SURGERY Right 2013  . SHOULDER ARTHROSCOPY WITH ROTATOR CUFF REPAIR Left 05/22/2019   Procedure: LEFT SHOULDER ARTHROSCOPY WITH ROTATOR CUFF REPAIR;  Surgeon: Bjorn Pippin, MD;  Location: [facility];  Service: Orthopedics;  Laterality: Left;  . SHOULDER ARTHROSCOPY WITH SUBACROMIAL DECOMPRESSION AND BICEP TENDON REPAIR Left 05/22/2019   Procedure: LEFT SHOULDER ARTHROSCOPY  DEBRIDEMENT WITH SUBACROMIAL DECOMPRESSION AND BICEP TENODESIS;  Surgeon: Bjorn Pippin, MD;  Location: [facility];  Service: Orthopedics;  Laterality: Left;  . TOTAL KNEE ARTHROPLASTY Left 09/09/2013   Procedure: LEFT TOTAL KNEE ARTHROPLASTY;  Surgeon: Nilda Simmer, MD;  [facility];  Service: Orthopedics;  Laterality: Left;    There were  no vitals filed for this visit.  Subjective Assessment - 06/20/19 1728    Subjective  COVID-19 screen performed prior to patient entering clinic.  Did good yesterday  with low pain levels, but increased today for some reason. pain today 6/10    Pertinent History  TKA, lumbar surgery, OA.    Patient Stated Goals  Be able to use my arm    Currently in Pain?  Yes    Pain Score  6     Pain Location  Shoulder    Pain Orientation  Left    Pain Descriptors / Indicators  Aching;Sore;Throbbing                       [facility] Adult PT Treatment/Exercise - 06/20/19 0001      Exercises   Exercises  Shoulder      Modalities   Modalities  Vasopneumatic;Electrical Stimulation      Electrical Stimulation   Electrical Stimulation Location  L shoulder    Electrical Stimulation Action  Premod    Electrical Stimulation Parameters  80-150hz  x 15 mins    Electrical Stimulation Goals  Pain      Vasopneumatic   Number Minutes Vasopneumatic   15 minutes    Vasopnuematic Location   Shoulder  Vasopneumatic Pressure  Low    Vasopneumatic Temperature   36      Manual Therapy   Manual Therapy  Passive ROM    Passive ROM  PROM to LT shldr in supine for ER , flexion with oscillations b/w bouts for relation/ decrease pain. Flexion to 108 degrees today and ER to40 degrees                  PT Long Term Goals - 06/07/19 1245      PT LONG TERM GOAL #1   Title  Patient will be independent with HEP    Time  8    Period  Weeks    Status  New      PT LONG TERM GOAL #2   Title  Perform basci ADL's with pain </= 3/10.    Baseline  unable to perform ADL's currently    Time  8    Period  Weeks    Status  New      PT LONG TERM GOAL #3   Title  Pt will improve her L shoulder flexion >/= 140 degrees.    Baseline  see flowsheets    Time  8    Period  Weeks    Status  New      PT LONG TERM GOAL #4   Title  Pt will improve her left ER to 40 degrees (limited by protocol).     Baseline  see slow sheets    Time  8    Period  Weeks    Status  New      PT LONG TERM GOAL #5   Title  pt will improve grip strength on L hand to >/= 50 ppsi.    Baseline  32 ppsi    Time  8    Period  Weeks    Status  New            Plan - 06/20/19 1745    Clinical Impression Statement  Pt arrived today with LT shldr pain doing about the same, but had a good day yesterday with low pain levels 3-4/10. PROM was performed again to LT shldr with ROM doing about the same flexion to 108 degrees and ER to 40 degrees. Normal modality response today.    Personal Factors and Comorbidities  Comorbidity 1;Comorbidity 2    Comorbidities  TKA, lumbar surgery, OA.    Examination-Activity Limitations  Lift;Carry;Dressing;Caring for Others;Sleep    Examination-Participation Restrictions  Other    Stability/Clinical Decision Making  Stable/Uncomplicated    Rehab Potential  Good    PT Frequency  2x / week    PT Duration  8 weeks    PT Treatment/Interventions  ADLs/Self Care Home Management;Cryotherapy;Electrical Stimulation;Ultrasound;Moist Heat;Therapeutic activities;Therapeutic exercise;Manual techniques;Patient/family education;Passive range of motion;Dry needling;Vasopneumatic Device;Taping;Functional mobility training    PT Next Visit Plan  Pendulums, grip strength, PROM, Vasopenumatic    PT Home Exercise Plan  pendulums, ball squeezes    Consulted and Agree with Plan of Care  Patient       Patient will benefit from skilled therapeutic intervention in order to improve the following deficits and impairments:  Pain, Postural dysfunction, Impaired UE functional use, Decreased activity tolerance, Decreased strength, Decreased range of motion, Increased edema  Visit Diagnosis: Stiffness of left shoulder, not elsewhere classified  Muscle weakness (generalized)  Chronic left shoulder pain     Problem List Patient Active Problem List   Diagnosis Date Noted  . DJD (degenerative joint  disease) of knee 09/09/2013  . Insomnia   . Depression with anxiety   . Hypothyroid   . Lymphocytic colitis   . Left knee DJD     Theresa Reese,[name], PTA 06/20/2019, 5:48 PM  [facility] [address] [place], [place], [address] Phone: [phone]   Fax:  [phone]  Name: Theresa Reese [place] MRN: [id number] Date of Birth: 01/25/1951

## 2019-06-25 ENCOUNTER — Other Ambulatory Visit: Payer: Self-pay

## 2019-06-25 ENCOUNTER — Ambulatory Visit: Payer: Medicare PPO | Admitting: *Deleted

## 2019-06-25 DIAGNOSIS — M6281 Muscle weakness (generalized): Secondary | ICD-10-CM

## 2019-06-25 DIAGNOSIS — M25612 Stiffness of left shoulder, not elsewhere classified: Secondary | ICD-10-CM

## 2019-06-25 DIAGNOSIS — G8929 Other chronic pain: Secondary | ICD-10-CM

## 2019-06-25 NOTE — Therapy (Signed)
[facility] [address] [place], [place], [address] Phone: [phone]   Fax:  [phone]  Physical Therapy Treatment  Patient Details  Name: Theresa Reese MRN: [id number] Date of Birth: 02/13/1951 Referring Provider (PT): Ramond Marrow   Encounter Date: 06/25/2019  PT End of Session - 06/25/19 1825    Visit Number  6    Number of Visits  16    Date for PT Re-Evaluation  08/02/19    Authorization Type  Dash score   43.9 03-28-19    PT Start Time  1430    PT Stop Time  1524    PT Time Calculation (min)  54 min       Past Medical History:  Diagnosis Date  . GERD (gastroesophageal reflux disease)   . Hypercholesteremia   . Hypertension   . Hypothyroid   . Insomnia   . Left knee DJD   . Left shoulder pain   . Lymphocytic colitis     Past Surgical History:  Procedure Laterality Date  . BACK SURGERY    . CESAREAN SECTION  1987  . CESAREAN SECTION  1990  . COLONOSCOPY    . EYE SURGERY Bilateral    cataract  . JOINT REPLACEMENT Left   . KNEE ARTHROSCOPY W/ MENISCECTOMY Left 2014   Dr Shelle Iron  . low back surgery  1997   Dr Jule Ser  . low back surgery  1994   Dr Elesa Hacker  . RETINAL DETACHMENT SURGERY Right 2013  . SHOULDER ARTHROSCOPY WITH ROTATOR CUFF REPAIR Left 05/22/2019   Procedure: LEFT SHOULDER ARTHROSCOPY WITH ROTATOR CUFF REPAIR;  Surgeon: Bjorn Pippin, MD;  Location: [facility];  Service: Orthopedics;  Laterality: Left;  . SHOULDER ARTHROSCOPY WITH SUBACROMIAL DECOMPRESSION AND BICEP TENDON REPAIR Left 05/22/2019   Procedure: LEFT SHOULDER ARTHROSCOPY  DEBRIDEMENT WITH SUBACROMIAL DECOMPRESSION AND BICEP TENODESIS;  Surgeon: Bjorn Pippin, MD;  Location: [facility];  Service: Orthopedics;  Laterality: Left;  . TOTAL KNEE ARTHROPLASTY Left 09/09/2013   Procedure: LEFT TOTAL KNEE ARTHROPLASTY;  Surgeon: Nilda Simmer, MD;  [facility];  Service: Orthopedics;  Laterality: Left;    There were  no vitals filed for this visit.  Subjective Assessment - 06/25/19 1442    Subjective  COVID-19 screen performed prior to patient entering clinic.  Did good yesterday  with low pain levels, but increased today for some reason. pain today 3/10    Pertinent History  TKA, lumbar surgery, OA.    Patient Stated Goals  Be able to use my arm    Pain Score  3     Pain Location  Shoulder    Pain Orientation  Left    Pain Descriptors / Indicators  Aching;Sore    Pain Type  Surgical pain    Pain Onset  1 to 4 weeks ago                       [facility] Adult PT Treatment/Exercise - 06/25/19 0001      Exercises   Exercises  Shoulder      Modalities   Modalities  Vasopneumatic;Electrical Stimulation      Electrical Stimulation   Electrical Stimulation Location  L shoulder    Electrical Stimulation Action  premod    Electrical Stimulation Parameters  80-150hz  x 15 mins    Electrical Stimulation Goals  Pain      Vasopneumatic   Number Minutes Vasopneumatic  15 minutes    Vasopnuematic Location   Shoulder    Vasopneumatic Pressure  Low    Vasopneumatic Temperature   36      Manual Therapy   Manual Therapy  Passive ROM    Passive ROM  PROM to LT shldr in supine for ER , flexion , and abd with oscillations b/w bouts for relation/ decrease pain. Flexion to 130 degrees today and ER to40 degrees                  PT Long Term Goals - 06/07/19 1245      PT LONG TERM GOAL #1   Title  Patient will be independent with HEP    Time  8    Period  Weeks    Status  New      PT LONG TERM GOAL #2   Title  Perform basci ADL's with pain </= 3/10.    Baseline  unable to perform ADL's currently    Time  8    Period  Weeks    Status  New      PT LONG TERM GOAL #3   Title  Pt will improve her L shoulder flexion >/= 140 degrees.    Baseline  see flowsheets    Time  8    Period  Weeks    Status  New      PT LONG TERM GOAL #4   Title  Pt will improve her left ER to 40  degrees (limited by protocol).    Baseline  see slow sheets    Time  8    Period  Weeks    Status  New      PT LONG TERM GOAL #5   Title  pt will improve grip strength on L hand to >/= 50 ppsi.    Baseline  32 ppsi    Time  8    Period  Weeks    Status  New            Plan - 06/25/19 1828    Clinical Impression Statement  Pt arrived today doing a little better with lower pain. PROM was performed and Pt was able to reach 130 degrees of flexion 80 degrees of abd, and 40degrees of ER again. Normal modality response today. Pt still needs V/Cs to rrelax as well as oscillations.    Comorbidities  TKA, lumbar surgery, OA.    Examination-Activity Limitations  Lift;Carry;Dressing;Caring for Others;Sleep    Examination-Participation Restrictions  Other    Stability/Clinical Decision Making  Stable/Uncomplicated    Rehab Potential  Good    PT Frequency  2x / week    PT Treatment/Interventions  ADLs/Self Care Home Management;Cryotherapy;Electrical Stimulation;Ultrasound;Moist Heat;Therapeutic activities;Therapeutic exercise;Manual techniques;Patient/family education;Passive range of motion;Dry needling;Vasopneumatic Device;Taping;Functional mobility training    PT Next Visit Plan  Pendulums, grip strength, PROM, Vasopenumatic    PT Home Exercise Plan  pendulums, ball squeezes       Patient will benefit from skilled therapeutic intervention in order to improve the following deficits and impairments:  Pain, Postural dysfunction, Impaired UE functional use, Decreased activity tolerance, Decreased strength, Decreased range of motion, Increased edema  Visit Diagnosis: Stiffness of left shoulder, not elsewhere classified  Muscle weakness (generalized)  Chronic left shoulder pain     Problem List Patient Active Problem List   Diagnosis Date Noted  . DJD (degenerative joint disease) of knee 09/09/2013  . Insomnia   . Depression with anxiety   . Hypothyroid   .  Lymphocytic colitis    . Left knee DJD     Hermenia Fritcher,[name], PTA 06/25/2019, 6:32 PM  [facility] [address] [place], [place], [address] Phone: [phone]   Fax:  [phone]  Name: Theresa Reese MRN: [id number] Date of Birth: 18-Jul-1950

## 2019-06-27 ENCOUNTER — Other Ambulatory Visit: Payer: Self-pay

## 2019-06-27 ENCOUNTER — Ambulatory Visit: Payer: Medicare PPO | Admitting: *Deleted

## 2019-06-27 DIAGNOSIS — M25612 Stiffness of left shoulder, not elsewhere classified: Secondary | ICD-10-CM

## 2019-06-27 DIAGNOSIS — M25512 Pain in left shoulder: Secondary | ICD-10-CM

## 2019-06-27 DIAGNOSIS — G8929 Other chronic pain: Secondary | ICD-10-CM

## 2019-06-27 DIAGNOSIS — M6281 Muscle weakness (generalized): Secondary | ICD-10-CM

## 2019-06-27 NOTE — Therapy (Signed)
[facility] [place], [place], [address] Phone: [phone]   Fax:  [phone]  Physical Therapy Treatment  Patient Details  Name: Theresa Reese MRN: [id number] Date of Birth: 04-13-1950 Referring Provider (PT): Ophelia Charter   Encounter Date: 06/27/2019  PT End of Session - 06/27/19 1740    Visit Number  7    Number of Visits  16    Date for PT Re-Evaluation  08/02/19    Authorization Type  Dash score   43.9 03-28-19    PT Start Time  1430    PT Stop Time  1525    PT Time Calculation (min)  55 min       Past Medical History:  Diagnosis Date  . GERD (gastroesophageal reflux disease)   . Hypercholesteremia   . Hypertension   . Hypothyroid   . Insomnia   . Left knee DJD   . Left shoulder pain   . Lymphocytic colitis     Past Surgical History:  Procedure Laterality Date  . BACK SURGERY    . CESAREAN SECTION  1987  . CESAREAN SECTION  1990  . COLONOSCOPY    . EYE SURGERY Bilateral    cataract  . JOINT REPLACEMENT Left   . KNEE ARTHROSCOPY W/ MENISCECTOMY Left 2014   Dr Tonita Cong  . low back surgery  1997   Dr Rita Ohara  . low back surgery  1994   Dr Rolin Barry  . RETINAL DETACHMENT SURGERY Right 2013  . SHOULDER ARTHROSCOPY WITH ROTATOR CUFF REPAIR Left 05/22/2019   Procedure: LEFT SHOULDER ARTHROSCOPY WITH ROTATOR CUFF REPAIR;  Surgeon: Hiram Gash, MD;  Location: [facility];  Service: Orthopedics;  Laterality: Left;  . SHOULDER ARTHROSCOPY WITH SUBACROMIAL DECOMPRESSION AND BICEP TENDON REPAIR Left 05/22/2019   Procedure: LEFT SHOULDER ARTHROSCOPY  DEBRIDEMENT WITH SUBACROMIAL DECOMPRESSION AND BICEP TENODESIS;  Surgeon: Hiram Gash, MD;  Location: [facility];  Service: Orthopedics;  Laterality: Left;  . TOTAL KNEE ARTHROPLASTY Left 09/09/2013   Procedure: LEFT TOTAL KNEE ARTHROPLASTY;  Surgeon: Lorn Junes, MD;  Location: [facility];  Service: Orthopedics;  Laterality: Left;    There were  no vitals filed for this visit.  Subjective Assessment - 06/27/19 1738    Subjective  COVID-19 screen performed prior to patient entering clinic.  Did good yesterday  with low pain levels, but increased today for some reason. pain today 3/10. Can only do some ER stretching at home due to pain    Pertinent History  TKA, lumbar surgery, OA.    Patient Stated Goals  Be able to use my arm    Currently in Pain?  Yes    Pain Score  3     Pain Location  Shoulder    Pain Orientation  Left    Pain Descriptors / Indicators  Aching;Sore;Tightness    Pain Type  Surgical pain    Pain Onset  1 to 4 weeks ago                       [facility] Adult PT Treatment/Exercise - 06/27/19 0001      Exercises   Exercises  Shoulder      Modalities   Modalities  Vasopneumatic;Electrical Stimulation      Electrical Stimulation   Electrical Stimulation Location  L shoulder    Electrical Stimulation Action  premod    Electrical Stimulation Parameters  80-150hz x 15 mins  Electrical Stimulation Goals  Pain      Vasopneumatic   Number Minutes Vasopneumatic   15 minutes    Vasopnuematic Location   Shoulder    Vasopneumatic Pressure  Low    Vasopneumatic Temperature   36      Manual Therapy   Manual Therapy  Passive ROM    Passive ROM  PROM to LT shldr in supine for ER , flexion , and abd with oscillations b/w bouts for relation/ decrease pain. Flexion to 135 degrees today and ER to 35 degrees                  PT Long Term Goals - 06/07/19 1245      PT LONG TERM GOAL #1   Title  Patient will be independent with HEP    Time  8    Period  Weeks    Status  New      PT LONG TERM GOAL #2   Title  Perform basci ADL's with pain </= 3/10.    Baseline  unable to perform ADL's currently    Time  8    Period  Weeks    Status  New      PT LONG TERM GOAL #3   Title  Pt will improve her L shoulder flexion >/= 140 degrees.    Baseline  see flowsheets    Time  8    Period  Weeks     Status  New      PT LONG TERM GOAL #4   Title  Pt will improve her left ER to 40 degrees (limited by protocol).    Baseline  see slow sheets    Time  8    Period  Weeks    Status  New      PT LONG TERM GOAL #5   Title  pt will improve grip strength on L hand to >/= 50 ppsi.    Baseline  32 ppsi    Time  8    Period  Weeks    Status  New            Plan - 06/27/19 1743    Clinical Impression Statement  Pt reports that she recovers after each Rx by the next day, but is unable to perform cane ER stretching at home due to pain. PROM performed today with progression of flexion to 135 dgrees, but ER only to 35 degrees. Pt instructed in standing cane ER stretching for HEP Normal modality response.    Comorbidities  TKA, lumbar surgery, OA.    Examination-Activity Limitations  Lift;Carry;Dressing;Caring for Others;Sleep    Stability/Clinical Decision Making  Stable/Uncomplicated    PT Frequency  2x / week    PT Treatment/Interventions  ADLs/Self Care Home Management;Cryotherapy;Electrical Stimulation;Ultrasound;Moist Heat;Therapeutic activities;Therapeutic exercise;Manual techniques;Patient/family education;Passive range of motion;Dry needling;Vasopneumatic Device;Taping;Functional mobility training    PT Next Visit Plan  Pendulums, grip strength, PROM, Vasopenumatic    Consulted and Agree with Plan of Care  Patient       Patient will benefit from skilled therapeutic intervention in order to improve the following deficits and impairments:  Pain, Postural dysfunction, Impaired UE functional use, Decreased activity tolerance, Decreased strength, Decreased range of motion, Increased edema  Visit Diagnosis: Muscle weakness (generalized)  Stiffness of left shoulder, not elsewhere classified  Chronic left shoulder pain     Problem List Patient Active Problem List   Diagnosis Date Noted  . DJD (degenerative joint disease) of knee 09/09/2013  .  Insomnia   . Depression with anxiety    . Hypothyroid   . Lymphocytic colitis   . Left knee DJD     Audriella Blakeley,[name], PTA 06/27/2019, 5:46 PM  [facility] [address] [place], [place], [address] Phone: [phone]   Fax:  [phone]  Name: SHEREL FENNELL MRN: [id number] Date of Birth: 1950-06-01

## 2019-07-02 ENCOUNTER — Other Ambulatory Visit: Payer: Self-pay

## 2019-07-02 ENCOUNTER — Ambulatory Visit: Payer: Medicare PPO | Admitting: *Deleted

## 2019-07-02 DIAGNOSIS — M25512 Pain in left shoulder: Secondary | ICD-10-CM

## 2019-07-02 DIAGNOSIS — G8929 Other chronic pain: Secondary | ICD-10-CM

## 2019-07-02 DIAGNOSIS — M6281 Muscle weakness (generalized): Secondary | ICD-10-CM

## 2019-07-02 DIAGNOSIS — M25612 Stiffness of left shoulder, not elsewhere classified: Secondary | ICD-10-CM | POA: Diagnosis not present

## 2019-07-02 NOTE — Therapy (Signed)
[facility] [address] [place], [place], [address] Phone: [phone]   Fax:  [phone]  Physical Therapy Treatment  Patient Details  Name: Theresa Reese MRN: [id number] Date of Birth: [birth year] Referring Provider (PT): Ramond Marrow   Encounter Date: 07/02/2019  PT End of Session - 07/02/19 1342    Visit Number  8    Number of Visits  16    Date for PT Re-Evaluation  08/02/19    Authorization Type  Dash score   43.9 03-28-19    PT Start Time  1330    PT Stop Time  1425    PT Time Calculation (min)  55 min       Past Medical History:  Diagnosis Date  . GERD (gastroesophageal reflux disease)   . Hypercholesteremia   . Hypertension   . Hypothyroid   . Insomnia   . Left knee DJD   . Left shoulder pain   . Lymphocytic colitis     Past Surgical History:  Procedure Laterality Date  . BACK SURGERY    . CESAREAN SECTION  1987  . CESAREAN SECTION  1990  . COLONOSCOPY    . EYE SURGERY Bilateral    cataract  . JOINT REPLACEMENT Left   . KNEE ARTHROSCOPY W/ MENISCECTOMY Left 2014   Dr Shelle Iron  . low back surgery  1997   Dr Jule Ser  . low back surgery  1994   Dr Elesa Hacker  . RETINAL DETACHMENT SURGERY Right 2013  . SHOULDER ARTHROSCOPY WITH ROTATOR CUFF REPAIR Left 05/22/2019   Procedure: LEFT SHOULDER ARTHROSCOPY WITH ROTATOR CUFF REPAIR;  Surgeon: Bjorn Pippin, MD;  Location: [facility];  Service: Orthopedics;  Laterality: Left;  . SHOULDER ARTHROSCOPY WITH SUBACROMIAL DECOMPRESSION AND BICEP TENDON REPAIR Left 05/22/2019   Procedure: LEFT SHOULDER ARTHROSCOPY  DEBRIDEMENT WITH SUBACROMIAL DECOMPRESSION AND BICEP TENODESIS;  Surgeon: Bjorn Pippin, MD;  Location: [facility];  Service: Orthopedics;  Laterality: Left;  . TOTAL KNEE ARTHROPLASTY Left 09/09/2013   Procedure: LEFT TOTAL KNEE ARTHROPLASTY;  Surgeon: Nilda Simmer, MD;  [facility];  Service: Orthopedics;  Laterality: Left;    There  were no vitals filed for this visit.  Subjective Assessment - 07/02/19 1340    Subjective  COVID-19 screen performed prior to patient entering clinic. Very sore after last Rx. MD this Friday    Pertinent History  TKA, lumbar surgery, OA.    Patient Stated Goals  Be able to use my arm    Pain Score  4     Pain Location  Shoulder    Pain Orientation  Left    Pain Descriptors / Indicators  Aching;Sore;Stabbing                       [facility] Adult PT Treatment/Exercise - 07/02/19 0001      Exercises   Exercises  Shoulder      Shoulder Exercises: Supine   Other Supine Exercises  supine cane press and flexion x 10 each      Shoulder Exercises: Pulleys   Flexion  5 minutes    Other Pulley Exercises  seated UE ranger x 5 mins flexion/ ext , CW, CCW      Modalities   Modalities  Vasopneumatic;Electrical Stimulation      Electrical Stimulation   Electrical Stimulation Location  L shoulder    Electrical Stimulation Action  premod    Electrical Stimulation Parameters  80-150hz  x 15 mins    Electrical Stimulation Goals  Pain      Vasopneumatic   Number Minutes Vasopneumatic   15 minutes    Vasopnuematic Location   Shoulder    Vasopneumatic Pressure  Low    Vasopneumatic Temperature   36      Manual Therapy   Manual Therapy  Passive ROM    Passive ROM  PROM to LT shldr in supine for ER , flexion , and abd with oscillations b/w bouts for relation/ decrease pain.                  PT Long Term Goals - 06/07/19 1245      PT LONG TERM GOAL #1   Title  Patient will be independent with HEP    Time  8    Period  Weeks    Status  New      PT LONG TERM GOAL #2   Title  Perform basci ADL's with pain </= 3/10.    Baseline  unable to perform ADL's currently    Time  8    Period  Weeks    Status  New      PT LONG TERM GOAL #3   Title  Pt will improve her L shoulder flexion >/= 140 degrees.    Baseline  see flowsheets    Time  8    Period  Weeks    Status   New      PT LONG TERM GOAL #4   Title  Pt will improve her left ER to 40 degrees (limited by protocol).    Baseline  see slow sheets    Time  8    Period  Weeks    Status  New      PT LONG TERM GOAL #5   Title  pt will improve grip strength on L hand to >/= 50 ppsi.    Baseline  32 ppsi    Time  8    Period  Weeks    Status  New            Plan - 07/02/19 1426    Clinical Impression Statement  Pt arrived today doing about the same with LT shldr pain and soreness. AAROM pulleys, supine cane , and seated UE ranger started today and tolerated well. Pt to order  home pulleys. PROM/PAROM performed and Pt was able to get ER to 40 degrees. Normal modality response today.    Comorbidities  TKA, lumbar surgery, OA.    Examination-Activity Limitations  Lift;Carry;Dressing;Caring for Others;Sleep    Examination-Participation Restrictions  Other    Stability/Clinical Decision Making  Stable/Uncomplicated    Rehab Potential  Good    PT Frequency  2x / week    PT Duration  8 weeks    PT Treatment/Interventions  ADLs/Self Care Home Management;Cryotherapy;Electrical Stimulation;Ultrasound;Moist Heat;Therapeutic activities;Therapeutic exercise;Manual techniques;Patient/family education;Passive range of motion;Dry needling;Vasopneumatic Device;Taping;Functional mobility training    PT Next Visit Plan  AAROM pulleys, seated UE ranger       Patient will benefit from skilled therapeutic intervention in order to improve the following deficits and impairments:  Pain, Postural dysfunction, Impaired UE functional use, Decreased activity tolerance, Decreased strength, Decreased range of motion, Increased edema  Visit Diagnosis: Muscle weakness (generalized)  Stiffness of left shoulder, not elsewhere classified  Chronic left shoulder pain     Problem List Patient Active Problem List   Diagnosis Date Noted  . DJD (degenerative joint disease)  of knee 09/09/2013  . Insomnia   . Depression  with anxiety   . Hypothyroid   . Lymphocytic colitis   . Left knee DJD     Riverlyn Kizziah,[name], PTA 07/02/2019, 2:47 PM  [facility] [place], [place], [address] Phone: [phone]   Fax:  [phone]  Name: Theresa Reese MRN: [id number] Date of Birth: 1950/07/02

## 2019-07-04 ENCOUNTER — Ambulatory Visit: Payer: Medicare PPO | Admitting: *Deleted

## 2019-07-04 ENCOUNTER — Other Ambulatory Visit: Payer: Self-pay

## 2019-07-04 DIAGNOSIS — M25612 Stiffness of left shoulder, not elsewhere classified: Secondary | ICD-10-CM

## 2019-07-04 DIAGNOSIS — M6281 Muscle weakness (generalized): Secondary | ICD-10-CM

## 2019-07-04 DIAGNOSIS — G8929 Other chronic pain: Secondary | ICD-10-CM

## 2019-07-04 NOTE — Therapy (Signed)
[facility] [address] [place], [place], [address] Phone: [phone]   Fax:  [phone]  Physical Therapy Treatment  Patient Details  Name: Theresa Reese MRN: [id number] Date of Birth: 1950-06-21 Referring Provider (PT): Ramond Marrow   Encounter Date: 07/04/2019  PT End of Session - 07/04/19 1455    Visit Number  9    Number of Visits  16    Date for PT Re-Evaluation  08/02/19    Authorization Type  Dash score   43.9 03-28-19    PT Start Time  1430       Past Medical History:  Diagnosis Date  . GERD (gastroesophageal reflux disease)   . Hypercholesteremia   . Hypertension   . Hypothyroid   . Insomnia   . Left knee DJD   . Left shoulder pain   . Lymphocytic colitis     Past Surgical History:  Procedure Laterality Date  . BACK SURGERY    . CESAREAN SECTION  1987  . CESAREAN SECTION  1990  . COLONOSCOPY    . EYE SURGERY Bilateral    cataract  . JOINT REPLACEMENT Left   . KNEE ARTHROSCOPY W/ MENISCECTOMY Left 2014   Dr Shelle Iron  . low back surgery  1997   Dr Jule Ser  . low back surgery  1994   Dr Elesa Hacker  . RETINAL DETACHMENT SURGERY Right 2013  . SHOULDER ARTHROSCOPY WITH ROTATOR CUFF REPAIR Left 05/22/2019   Procedure: LEFT SHOULDER ARTHROSCOPY WITH ROTATOR CUFF REPAIR;  Surgeon: Bjorn Pippin, MD;  Location: [facility];  Service: Orthopedics;  Laterality: Left;  . SHOULDER ARTHROSCOPY WITH SUBACROMIAL DECOMPRESSION AND BICEP TENDON REPAIR Left 05/22/2019   Procedure: LEFT SHOULDER ARTHROSCOPY  DEBRIDEMENT WITH SUBACROMIAL DECOMPRESSION AND BICEP TENODESIS;  Surgeon: Bjorn Pippin, MD;  [facility];  Service: Orthopedics;  Laterality: Left;  . 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  Subjective Assessment -  07/04/19 1449    Subjective  COVID-19 screen performed prior to patient entering clinic.  Lt shldr 5/10 . MD this Friday    Pertinent History  TKA, lumbar surgery, OA.    Patient Stated Goals  Be able to use my arm    Currently in Pain?  Yes    Pain Score  5     Pain Location  Shoulder    Pain Orientation  Left    Pain Descriptors / Indicators  Aching;Sore;Stabbing;Guarding    Pain Type  Surgical pain    Pain Onset  1 to 4 weeks ago                       [facility] Adult PT Treatment/Exercise - 07/04/19 0001      Exercises   Exercises  Shoulder      Shoulder Exercises: Supine   Other Supine Exercises  supine cane press and flexion x 10 each      Shoulder Exercises: Pulleys   Flexion  5 minutes    Other Pulley Exercises  seated UE ranger x 5 mins flexion/ ext , CW, CCW      Modalities   Modalities  Vasopneumatic;Electrical Stimulation      Electrical Stimulation   Electrical Stimulation Location  L shoulder    Electrical Stimulation Action  premod     Electrical Stimulation Parameters  80-150hz  x 15 mins    Electrical Stimulation Goals  Pain      Vasopneumatic   Number Minutes Vasopneumatic   15 minutes    Vasopnuematic Location   Shoulder    Vasopneumatic Pressure  Low    Vasopneumatic Temperature   36      Manual Therapy   Manual Therapy  Passive ROM    Passive ROM  PROM to LT shldr in supine for ER , flexion , and abd with oscillations b/w bouts for relation/ decrease pain.                  PT Long Term Goals - 07/04/19 1732      PT LONG TERM GOAL #1   Title  Patient will be independent with HEP    Time  8    Period  Weeks    Status  On-going      PT LONG TERM GOAL #2   Title  Perform basci ADL's with pain </= 3/10.    Baseline  unable to perform ADL's currently    Time  8    Period  Weeks    Status  On-going      PT LONG TERM GOAL #3   Title  Pt will improve her L shoulder flexion >/= 140 degrees.    Baseline  see flowsheets     Time  8    Period  Weeks    Status  On-going      PT LONG TERM GOAL #4   Title  Pt will improve her left ER to 40 degrees (limited by protocol).    Baseline  see slow sheets    Time  8    Period  Weeks    Status  On-going      PT LONG TERM GOAL #5   Title  pt will improve grip strength on L hand to >/= 50 ppsi.    Baseline  32 ppsi    Period  Weeks    Status  On-going            Plan - 07/04/19 1725    Clinical Impression Statement  Pt arrived today doing fair with LT shldr. She reports performing some of her HEP, but ER is still very tight/painful. She was able to perform AAROM again slow and controlled. Pt has ordered over the door pulleys for home to aid progression. PROM perfromed and passive flexion was 140 degrees at end range and ER was 41 degrees at end-range. Pt is still very guarded with PROM and needs v/c's and shldr oscillations to help with relaxation. Normal modality response today.    Comorbidities  TKA, lumbar surgery, OA.    Examination-Activity Limitations  Lift;Carry;Dressing;Caring for Others;Sleep    Examination-Participation Restrictions  Other    Stability/Clinical Decision Making  Stable/Uncomplicated    Rehab Potential  Good    PT Frequency  2x / week    PT Duration  8 weeks    PT Treatment/Interventions  ADLs/Self Care Home Management;Cryotherapy;Electrical Stimulation;Ultrasound;Moist Heat;Therapeutic activities;Therapeutic exercise;Manual techniques;Patient/family education;Passive range of motion;Dry needling;Vasopneumatic Device;Taping;Functional mobility training    PT Next Visit Plan  AAROM pulleys, seated UE ranger. To MD tomorrow       Patient will benefit from skilled therapeutic intervention in order to improve the following deficits and impairments:  Pain, Postural dysfunction, Impaired UE functional use, Decreased activity tolerance, Decreased strength, Decreased range of motion, Increased edema  Visit Diagnosis: Muscle weakness  (generalized)  Stiffness of left shoulder, not elsewhere classified  Chronic left shoulder pain     Problem List Patient Active Problem List   Diagnosis Date Noted  . DJD (degenerative joint disease) of knee 09/09/2013  . Insomnia   . Depression with anxiety   . Hypothyroid   . Lymphocytic colitis   . Left knee DJD     Jonasia Coiner,[name], PTA 07/04/2019, 5:33 PM  [facility] [place], [place], [address] Phone: [phone]   Fax:  [phone]  Name: Theresa Reese MRN: [id number] Date of Birth: [birth year]

## 2019-07-09 ENCOUNTER — Other Ambulatory Visit: Payer: Self-pay

## 2019-07-09 ENCOUNTER — Ambulatory Visit: Payer: Medicare PPO | Admitting: *Deleted

## 2019-07-09 DIAGNOSIS — G8929 Other chronic pain: Secondary | ICD-10-CM

## 2019-07-09 DIAGNOSIS — M6281 Muscle weakness (generalized): Secondary | ICD-10-CM

## 2019-07-09 DIAGNOSIS — M25612 Stiffness of left shoulder, not elsewhere classified: Secondary | ICD-10-CM | POA: Diagnosis not present

## 2019-07-09 NOTE — Therapy (Signed)
[facility] [place], [place], [address] Phone: [phone]   Fax:  [phone]  Physical Therapy Treatment  Patient Details  Name: Theresa Reese MRN: [id number] Date of Birth: May 08, 1950 Referring Provider (PT): Ophelia Charter   Encounter Date: 07/09/2019  PT End of Session - 07/09/19 9470    Visit Number  10    Number of Visits  16    Date for PT Re-Evaluation  08/02/19    Authorization Type  Dash score   43.9 03-28-19    PT Start Time  1430    PT Stop Time  1525    PT Time Calculation (min)  55 min       Past Medical History:  Diagnosis Date  . GERD (gastroesophageal reflux disease)   . Hypercholesteremia   . Hypertension   . Hypothyroid   . Insomnia   . Left knee DJD   . Left shoulder pain   . Lymphocytic colitis     Past Surgical History:  Procedure Laterality Date  . BACK SURGERY    . CESAREAN SECTION  1987  . CESAREAN SECTION  1990  . COLONOSCOPY    . EYE SURGERY Bilateral    cataract  . JOINT REPLACEMENT Left   . KNEE ARTHROSCOPY W/ MENISCECTOMY Left 2014   Dr Tonita Cong  . low back surgery  1997   Dr Rita Ohara  . low back surgery  1994   Dr Rolin Barry  . RETINAL DETACHMENT SURGERY Right 2013  . SHOULDER ARTHROSCOPY WITH ROTATOR CUFF REPAIR Left 05/22/2019   Procedure: LEFT SHOULDER ARTHROSCOPY WITH ROTATOR CUFF REPAIR;  Surgeon: Hiram Gash, MD;  Location: [facility];  Service: Orthopedics;  Laterality: Left;  . SHOULDER ARTHROSCOPY WITH SUBACROMIAL DECOMPRESSION AND BICEP TENDON REPAIR Left 05/22/2019   Procedure: LEFT SHOULDER ARTHROSCOPY  DEBRIDEMENT WITH SUBACROMIAL DECOMPRESSION AND BICEP TENODESIS;  Surgeon: Hiram Gash, MD;  Location: [facility];  Service: Orthopedics;  Laterality: Left;  . TOTAL KNEE ARTHROPLASTY Left 09/09/2013   Procedure: LEFT TOTAL KNEE ARTHROPLASTY;  Surgeon: Lorn Junes, MD;  Location: [facility];  Service: Orthopedics;  Laterality: Left;    There  were no vitals filed for this visit.  Subjective Assessment - 07/09/19 1446    Subjective  COVID-19 screen performed prior to patient entering clinic.  MD was pleased with current staus. Said I could start AROM.    Pertinent History  TKA, lumbar surgery, OA.    Patient Stated Goals  Be able to use my arm    Currently in Pain?  Yes    Pain Score  5     Pain Location  Shoulder    Pain Orientation  Left    Pain Descriptors / Indicators  Aching;Sore;Stabbing;Guarding    Pain Type  Surgical pain    Pain Onset  1 to 4 weeks ago                       [facility] Adult PT Treatment/Exercise - 07/09/19 0001      Exercises   Exercises  Shoulder      Shoulder Exercises: Supine   Protraction  AROM;Left;10 reps      Shoulder Exercises: Pulleys   Flexion  5 minutes    Other Pulley Exercises  seated UE ranger x 5 mins flexion/ ext , CW, CCW, Standing flexion x 15       Modalities   Modalities  Vasopneumatic;Electrical Stimulation  Programme researcher, broadcasting/film/video Location  L shoulder    Electrical Stimulation Action  premod    Electrical Stimulation Parameters  80-150hz  x 15 mins    Electrical Stimulation Goals  Pain      Vasopneumatic   Number Minutes Vasopneumatic   15 minutes    Vasopnuematic Location   Shoulder    Vasopneumatic Pressure  Low    Vasopneumatic Temperature   36      Manual Therapy   Manual Therapy  Passive ROM    Passive ROM  PROM to LT shldr in supine for ER , flexion , and abd with oscillations b/w bouts for relation/ decrease pain.                  PT Long Term Goals - 07/04/19 1732      PT LONG TERM GOAL #1   Title  Patient will be independent with HEP    Time  8    Period  Weeks    Status  On-going      PT LONG TERM GOAL #2   Title  Perform basci ADL's with pain </= 3/10.    Baseline  unable to perform ADL's currently    Time  8    Period  Weeks    Status  On-going      PT LONG TERM GOAL #3   Title  Pt  will improve her L shoulder flexion >/= 140 degrees.    Baseline  see flowsheets    Time  8    Period  Weeks    Status  On-going      PT LONG TERM GOAL #4   Title  Pt will improve her left ER to 40 degrees (limited by protocol).    Baseline  see slow sheets    Time  8    Period  Weeks    Status  On-going      PT LONG TERM GOAL #5   Title  pt will improve grip strength on L hand to >/= 50 ppsi.    Baseline  32 ppsi    Period  Weeks    Status  On-going            Plan - 07/09/19 1748    Clinical Impression Statement  Pt arrived today reporting MD was pleased with her current status and said it was okay  to start AROM. Pt did well with Rx today and had inproved ER PROM to 50 degrees.    Personal Factors and Comorbidities  Comorbidity 1;Comorbidity 2    Comorbidities  TKA, lumbar surgery, OA.    Examination-Activity Limitations  Lift;Carry;Dressing;Caring for Others;Sleep    Stability/Clinical Decision Making  Stable/Uncomplicated    Rehab Potential  Good    PT Frequency  2x / week    PT Duration  8 weeks    PT Treatment/Interventions  ADLs/Self Care Home Management;Cryotherapy;Electrical Stimulation;Ultrasound;Moist Heat;Therapeutic activities;Therapeutic exercise;Manual techniques;Patient/family education;Passive range of motion;Dry needling;Vasopneumatic Device;Taping;Functional mobility training    PT Next Visit Plan  AAROM pulleys, seated and standing UE ranger. AROM       Patient will benefit from skilled therapeutic intervention in order to improve the following deficits and impairments:     Visit Diagnosis: Muscle weakness (generalized)  Stiffness of left shoulder, not elsewhere classified  Chronic left shoulder pain     Problem List Patient Active Problem List   Diagnosis Date Noted  . DJD (degenerative joint disease) of knee 09/09/2013  .  Insomnia   . Depression with anxiety   . Hypothyroid   . Lymphocytic colitis   . Left knee DJD      Giuliana Handyside,[name], PTA 07/09/2019, 5:51 PM  [facility] [address] [place], [place], [address] Phone: [phone]   Fax:  [phone]  Name: Theresa Reese MRN: [id number] Date of Birth: 11/11/1950

## 2019-07-11 ENCOUNTER — Other Ambulatory Visit: Payer: Self-pay

## 2019-07-11 ENCOUNTER — Ambulatory Visit: Payer: Medicare PPO | Admitting: *Deleted

## 2019-07-11 DIAGNOSIS — G8929 Other chronic pain: Secondary | ICD-10-CM

## 2019-07-11 DIAGNOSIS — M25612 Stiffness of left shoulder, not elsewhere classified: Secondary | ICD-10-CM

## 2019-07-11 DIAGNOSIS — M25512 Pain in left shoulder: Secondary | ICD-10-CM

## 2019-07-11 DIAGNOSIS — M6281 Muscle weakness (generalized): Secondary | ICD-10-CM

## 2019-07-11 NOTE — Therapy (Signed)
[facility] [address] [place], [place], [address] Phone: [phone]   Fax:  [phone]  Physical Therapy Treatment  Patient Details  Name: Theresa Reese MRN: [id number] Date of Birth: [birth year] Referring Provider (PT): Ramond Marrow   Encounter Date: 07/11/2019  PT End of Session - 07/11/19 1718    Visit Number  11    Number of Visits  16    Date for PT Re-Evaluation  08/02/19    Authorization Type  Dash score   43.9 03-28-19    PT Start Time  1430    PT Stop Time  1525    PT Time Calculation (min)  55 min       Past Medical History:  Diagnosis Date  . GERD (gastroesophageal reflux disease)   . Hypercholesteremia   . Hypertension   . Hypothyroid   . Insomnia   . Left knee DJD   . Left shoulder pain   . Lymphocytic colitis     Past Surgical History:  Procedure Laterality Date  . BACK SURGERY    . CESAREAN SECTION  1987  . CESAREAN SECTION  1990  . COLONOSCOPY    . EYE SURGERY Bilateral    cataract  . JOINT REPLACEMENT Left   . KNEE ARTHROSCOPY W/ MENISCECTOMY Left 2014   Dr Shelle Iron  . low back surgery  1997   Dr Jule Ser  . low back surgery  1994   Dr Elesa Hacker  . RETINAL DETACHMENT SURGERY Right 2013  . SHOULDER ARTHROSCOPY WITH ROTATOR CUFF REPAIR Left 05/22/2019   Procedure: LEFT SHOULDER ARTHROSCOPY WITH ROTATOR CUFF REPAIR;  Surgeon: Bjorn Pippin, MD;  Location: [facility];  Service: Orthopedics;  Laterality: Left;  . SHOULDER ARTHROSCOPY WITH SUBACROMIAL DECOMPRESSION AND BICEP TENDON REPAIR Left 05/22/2019   Procedure: LEFT SHOULDER ARTHROSCOPY  DEBRIDEMENT WITH SUBACROMIAL DECOMPRESSION AND BICEP TENODESIS;  Surgeon: Bjorn Pippin, MD;  Location: [facility];  Service: Orthopedics;  Laterality: Left;  . TOTAL KNEE ARTHROPLASTY Left 09/09/2013   Procedure: LEFT TOTAL KNEE ARTHROPLASTY;  Surgeon: Nilda Simmer, MD;  [facility];  Service: Orthopedics;  Laterality: Left;    There  were no vitals filed for this visit.  Subjective Assessment - 07/11/19 1717    Subjective  COVID-19 screen performed prior to patient entering clinic.  Sore after last Rx.    Pertinent History  TKA, lumbar surgery, OA.    Currently in Pain?  Yes    Pain Score  5     Pain Location  Shoulder    Pain Orientation  Left    Pain Descriptors / Indicators  Aching;Sore    Pain Type  Surgical pain                       [facility] Adult PT Treatment/Exercise - 07/11/19 0001      Exercises   Exercises  Shoulder      Shoulder Exercises: Standing   External Rotation  AROM;Left;10 reps      Shoulder Exercises: Pulleys   Flexion  5 minutes    Other Pulley Exercises  seated UE ranger x 6 mins flexion/ ext , CW, CCW, Standing flexion x 15       Modalities   Modalities  Vasopneumatic;Electrical Stimulation      Electrical Stimulation   Electrical Stimulation Location  L shoulder  premod 80-150hz  x 15 mins    Electrical Stimulation Goals  Pain  Vasopneumatic   Number Minutes Vasopneumatic   15 minutes    Vasopnuematic Location   Shoulder    Vasopneumatic Pressure  Low    Vasopneumatic Temperature   36      Manual Therapy   Manual Therapy  Passive ROM    Passive ROM  PROM to LT shldr in supine for ER , flexion , and abd with oscillations b/w bouts for relation/ decrease pain.                  PT Long Term Goals - 07/04/19 1732      PT LONG TERM GOAL #1   Title  Patient will be independent with HEP    Time  8    Period  Weeks    Status  On-going      PT LONG TERM GOAL #2   Title  Perform basci ADL's with pain </= 3/10.    Baseline  unable to perform ADL's currently    Time  8    Period  Weeks    Status  On-going      PT LONG TERM GOAL #3   Title  Pt will improve her L shoulder flexion >/= 140 degrees.    Baseline  see flowsheets    Time  8    Period  Weeks    Status  On-going      PT LONG TERM GOAL #4   Title  Pt will improve her left ER to 40  degrees (limited by protocol).    Baseline  see slow sheets    Time  8    Period  Weeks    Status  On-going      PT LONG TERM GOAL #5   Title  pt will improve grip strength on L hand to >/= 50 ppsi.    Baseline  32 ppsi    Period  Weeks    Status  On-going            Plan - 07/11/19 1722    Clinical Impression Statement  Pt arrived today with normal LT shldr soreness. She didi well with AAROM and AROM exs. PROM performed and ER to 58 degrees toy. Normal modality response today    Comorbidities  TKA, lumbar surgery, OA.    Examination-Activity Limitations  Lift;Carry;Dressing;Caring for Others;Sleep    Stability/Clinical Decision Making  Stable/Uncomplicated    Rehab Potential  Good    PT Frequency  2x / week    PT Duration  8 weeks    PT Treatment/Interventions  ADLs/Self Care Home Management;Cryotherapy;Electrical Stimulation;Ultrasound;Moist Heat;Therapeutic activities;Therapeutic exercise;Manual techniques;Patient/family education;Passive range of motion;Dry needling;Vasopneumatic Device;Taping;Functional mobility training    PT Next Visit Plan  AAROM pulleys, seated and standing UE ranger. AROM       Patient will benefit from skilled therapeutic intervention in order to improve the following deficits and impairments:  Pain, Postural dysfunction, Impaired UE functional use, Decreased activity tolerance, Decreased strength, Decreased range of motion, Increased edema  Visit Diagnosis: Muscle weakness (generalized)  Stiffness of left shoulder, not elsewhere classified  Chronic left shoulder pain     Problem List Patient Active Problem List   Diagnosis Date Noted  . DJD (degenerative joint disease) of knee 09/09/2013  . Insomnia   . Depression with anxiety   . Hypothyroid   . Lymphocytic colitis   . Left knee DJD     Theresa Reese,[name], PTA 07/11/2019, 5:25 PM  [facility] [address] [place], [place],  [place] Phone:  [phone]   Fax:  [phone]  Name: Theresa Reese MRN: [id number] Date of Birth: 08/16/1950

## 2019-07-16 ENCOUNTER — Ambulatory Visit: Payer: Medicare PPO | Admitting: *Deleted

## 2019-07-16 ENCOUNTER — Other Ambulatory Visit: Payer: Self-pay

## 2019-07-16 DIAGNOSIS — M25612 Stiffness of left shoulder, not elsewhere classified: Secondary | ICD-10-CM

## 2019-07-16 DIAGNOSIS — M6281 Muscle weakness (generalized): Secondary | ICD-10-CM

## 2019-07-16 DIAGNOSIS — G8929 Other chronic pain: Secondary | ICD-10-CM

## 2019-07-16 NOTE — Therapy (Signed)
[facility] [place], [place], [address] Phone: [phone]   Fax:  [phone]  Physical Therapy Treatment  Patient Details  Name: Theresa Reese MRN: [id number] Date of Birth: [birth year] Referring Provider (PT): Ophelia Charter   Encounter Date: 07/16/2019  PT End of Session - 07/16/19 1453    Visit Number  12    Number of Visits  16    Date for PT Re-Evaluation  08/02/19    Authorization Type  Dash score   43.9 03-28-19    PT Start Time  1430    PT Stop Time  1524    PT Time Calculation (min)  54 min       Past Medical History:  Diagnosis Date  . GERD (gastroesophageal reflux disease)   . Hypercholesteremia   . Hypertension   . Hypothyroid   . Insomnia   . Left knee DJD   . Left shoulder pain   . Lymphocytic colitis     Past Surgical History:  Procedure Laterality Date  . BACK SURGERY    . CESAREAN SECTION  1987  . CESAREAN SECTION  1990  . COLONOSCOPY    . EYE SURGERY Bilateral    cataract  . JOINT REPLACEMENT Left   . KNEE ARTHROSCOPY W/ MENISCECTOMY Left 2014   Dr Tonita Cong  . low back surgery  1997   Dr Rita Ohara  . low back surgery  1994   Dr Rolin [facility]  . RETINAL DETACHMENT SURGERY Right 2013  . SHOULDER ARTHROSCOPY WITH ROTATOR CUFF REPAIR Left 05/22/2019   Procedure: LEFT SHOULDER ARTHROSCOPY WITH ROTATOR CUFF REPAIR;  Surgeon: Hiram Gash, MD;  Location: [place];  Service: Orthopedics;  Laterality: Left;  . SHOULDER ARTHROSCOPY WITH SUBACROMIAL DECOMPRESSION AND BICEP TENDON REPAIR Left 05/22/2019   Procedure: LEFT SHOULDER ARTHROSCOPY  DEBRIDEMENT WITH SUBACROMIAL DECOMPRESSION AND BICEP TENODESIS;  Surgeon: Hiram Gash, MD;  Location: [place];  Service: Orthopedics;  Laterality: Left;  . TOTAL KNEE ARTHROPLASTY Left 09/09/2013   Procedure: LEFT TOTAL KNEE ARTHROPLASTY;  Surgeon: Lorn Junes, MD;  Location: [place];  Service: Orthopedics;  Laterality: Left;    There  were no vitals filed for this visit.  Subjective Assessment - 07/16/19 1449    Subjective  COVID-19 screen performed prior to patient entering clinic.  Sore after last Rx. worked in the garden and am very sore    Pertinent History  TKA, lumbar surgery, OA.    Patient Stated Goals  Be able to use my arm    Currently in Pain?  Yes    Pain Score  5     Pain Location  Shoulder    Pain Orientation  Left    Pain Descriptors / Indicators  Aching;Sore    Pain Type  Surgical pain                       [facility] Adult PT Treatment/Exercise - 07/16/19 0001      Exercises   Exercises  Shoulder      Shoulder Exercises: Supine   Protraction  AROM;Left;10 reps;20 reps    Other Supine Exercises  cw/ccw 2x10 each      Shoulder Exercises: Pulleys   Flexion  5 minutes    Other Pulley Exercises  standing UE ranger x 6 mins flexion/ ext , CW, CCW, Standing flexion      Modalities   Modalities  Vasopneumatic;Electrical Stimulation  Programme researcher, broadcasting/film/video Location  L shoulder  premod 80-150hz  x 15 mins    Electrical Stimulation Action  premod     Electrical Stimulation Parameters  80-150hz  x 15 mins    Electrical Stimulation Goals  Pain      Vasopneumatic   Number Minutes Vasopneumatic   15 minutes    Vasopnuematic Location   Shoulder    Vasopneumatic Pressure  Low    Vasopneumatic Temperature   36      Manual Therapy   Manual Therapy  Passive ROM    Passive ROM  PROM to LT shldr in supine for ER , flexion , and abd with oscillations b/w bouts for relation/ decrease pain.                  PT Long Term Goals - 07/04/19 1732      PT LONG TERM GOAL #1   Title  Patient will be independent with HEP    Time  8    Period  Weeks    Status  On-going      PT LONG TERM GOAL #2   Title  Perform basci ADL's with pain </= 3/10.    Baseline  unable to perform ADL's currently    Time  8    Period  Weeks    Status  On-going      PT LONG TERM  GOAL #3   Title  Pt will improve her L shoulder flexion >/= 140 degrees.    Baseline  see flowsheets    Time  8    Period  Weeks    Status  On-going      PT LONG TERM GOAL #4   Title  Pt will improve her left ER to 40 degrees (limited by protocol).    Baseline  see slow sheets    Time  8    Period  Weeks    Status  On-going      PT LONG TERM GOAL #5   Title  pt will improve grip strength on L hand to >/= 50 ppsi.    Baseline  32 ppsi    Period  Weeks    Status  On-going            Plan - 07/16/19 1638    Clinical Impression Statement  Pt arrived today doing fairly well, but very sore after working in the garden some yesterday. She was able to perform AAROM and supine AROM and tolerated well. PROM performed in all directions. normal modality response.    Personal Factors and Comorbidities  Comorbidity 1;Comorbidity 2    Comorbidities  TKA, lumbar surgery, OA.    Examination-Activity Limitations  Lift;Carry;Dressing;Caring for Others;Sleep    Stability/Clinical Decision Making  Stable/Uncomplicated    Rehab Potential  Good    PT Frequency  2x / week    PT Duration  8 weeks    PT Treatment/Interventions  ADLs/Self Care Home Management;Cryotherapy;Electrical Stimulation;Ultrasound;Moist Heat;Therapeutic activities;Therapeutic exercise;Manual techniques;Patient/family education;Passive range of motion;Dry needling;Vasopneumatic Device;Taping;Functional mobility training    PT Next Visit Plan  AAROM pulleys, seated and standing UE ranger. AROM       Patient will benefit from skilled therapeutic intervention in order to improve the following deficits and impairments:  Pain, Postural dysfunction, Impaired UE functional use, Decreased activity tolerance, Decreased strength, Decreased range of motion, Increased edema  Visit Diagnosis: Muscle weakness (generalized)  Stiffness of left shoulder, not elsewhere classified  Chronic left shoulder pain  Problem List Patient  Active Problem List   Diagnosis Date Noted  . DJD (degenerative joint disease) of knee 09/09/2013  . Insomnia   . Depression with anxiety   . Hypothyroid   . Lymphocytic colitis   . Left knee DJD     Tsutomu Barfoot,[name], PTA 07/16/2019, 4:44 PM  [facility] [address] [place], [place], [address] Phone: [phone]   Fax:  [phone]  Name: Theresa Reese MRN: [id number] Date of Birth: 1950/05/21

## 2019-07-18 ENCOUNTER — Ambulatory Visit: Payer: Medicare PPO | Admitting: *Deleted

## 2019-07-18 ENCOUNTER — Other Ambulatory Visit: Payer: Self-pay

## 2019-07-18 DIAGNOSIS — M25612 Stiffness of left shoulder, not elsewhere classified: Secondary | ICD-10-CM

## 2019-07-18 DIAGNOSIS — M25511 Pain in right shoulder: Secondary | ICD-10-CM

## 2019-07-18 DIAGNOSIS — G8929 Other chronic pain: Secondary | ICD-10-CM

## 2019-07-18 DIAGNOSIS — M5442 Lumbago with sciatica, left side: Secondary | ICD-10-CM

## 2019-07-18 DIAGNOSIS — M6281 Muscle weakness (generalized): Secondary | ICD-10-CM

## 2019-07-18 DIAGNOSIS — M25512 Pain in left shoulder: Secondary | ICD-10-CM

## 2019-07-18 NOTE — Therapy (Signed)
[facility] [place], [place], [address] Phone: [phone]   Fax:  [phone]  Physical Therapy Treatment  Patient Details  Name: Theresa Reese MRN: [id number] Date of Birth: 16-Sep-1950 Referring Provider (PT): Ophelia Charter   Encounter Date: 07/18/2019  PT End of Session - 07/18/19 1535    Visit Number  13    Number of Visits  16    Date for PT Re-Evaluation  08/02/19    Authorization Type  Dash score   43.9 03-28-19    PT Start Time  1430    PT Stop Time  1524    PT Time Calculation (min)  54 min       Past Medical History:  Diagnosis Date  . GERD (gastroesophageal reflux disease)   . Hypercholesteremia   . Hypertension   . Hypothyroid   . Insomnia   . Left knee DJD   . Left shoulder pain   . Lymphocytic colitis     Past Surgical History:  Procedure Laterality Date  . BACK SURGERY    . CESAREAN SECTION  1987  . CESAREAN SECTION  1990  . COLONOSCOPY    . EYE SURGERY Bilateral    cataract  . JOINT REPLACEMENT Left   . KNEE ARTHROSCOPY W/ MENISCECTOMY Left 2014   Dr Tonita Cong  . low back surgery  1997   Dr Rita Ohara  . low back surgery  1994   Dr Rolin Barry  . RETINAL DETACHMENT SURGERY Right 2013  . SHOULDER ARTHROSCOPY WITH ROTATOR CUFF REPAIR Left 05/22/2019   Procedure: LEFT SHOULDER ARTHROSCOPY WITH ROTATOR CUFF REPAIR;  Surgeon: Hiram Gash, MD;  Location: [facility];  Service: Orthopedics;  Laterality: Left;  . SHOULDER ARTHROSCOPY WITH SUBACROMIAL DECOMPRESSION AND BICEP TENDON REPAIR Left 05/22/2019   Procedure: LEFT SHOULDER ARTHROSCOPY  DEBRIDEMENT WITH SUBACROMIAL DECOMPRESSION AND BICEP TENODESIS;  Surgeon: Hiram Gash, MD;  Location: [place];  Service: Orthopedics;  Laterality: Left;  . TOTAL KNEE ARTHROPLASTY Left 09/09/2013   Procedure: LEFT TOTAL KNEE ARTHROPLASTY;  Surgeon: Lorn Junes, MD;  Location: [facility];  Service: Orthopedics;  Laterality: Left;    There  were no vitals filed for this visit.  Subjective Assessment - 07/18/19 1527    Subjective  COVID-19 screen performed prior to patient entering clinic.  Sore after last Rx                       [facility] Adult PT Treatment/Exercise - 07/18/19 0001      Exercises   Exercises  Shoulder      Shoulder Exercises: Supine   Protraction  AROM;Left;10 reps;20 reps    Other Supine Exercises  cw/ccw 2x10 each      Shoulder Exercises: Pulleys   Flexion  5 minutes    Other Pulley Exercises  standing UE ranger x 6 mins flexion/ ext , CW, CCW, Standing flexion      Modalities   Modalities  Vasopneumatic;Electrical Stimulation      Electrical Stimulation   Electrical Stimulation Location  L shoulder  premod 80-150hz  x 15 mins    Electrical Stimulation Action  premod    Electrical Stimulation Parameters  80-150hz  x 15 mins    Electrical Stimulation Goals  Pain      Vasopneumatic   Number Minutes Vasopneumatic   15 minutes    Vasopnuematic Location   Shoulder    Vasopneumatic Pressure  Low    Vasopneumatic  Temperature   36      Manual Therapy   Manual Therapy  Passive ROM    Passive ROM  PROM to LT shldr in supine for ER , flexion , and abd with oscillations b/w bouts for relation/ decrease pain. IR isometrics for contract /relax stretcing to increase ER                  PT Long Term Goals - 07/04/19 1732      PT LONG TERM GOAL #1   Title  Patient will be independent with HEP    Time  8    Period  Weeks    Status  On-going      PT LONG TERM GOAL #2   Title  Perform basci ADL's with pain </= 3/10.    Baseline  unable to perform ADL's currently    Time  8    Period  Weeks    Status  On-going      PT LONG TERM GOAL #3   Title  Pt will improve her L shoulder flexion >/= 140 degrees.    Baseline  see flowsheets    Time  8    Period  Weeks    Status  On-going      PT LONG TERM GOAL #4   Title  Pt will improve her left ER to 40 degrees (limited by  protocol).    Baseline  see slow sheets    Time  8    Period  Weeks    Status  On-going      PT LONG TERM GOAL #5   Title  pt will improve grip strength on L hand to >/= 50 ppsi.    Baseline  32 ppsi    Period  Weeks    Status  On-going            Plan - 07/18/19 1621    Clinical Impression Statement  Pt arrived today doing fairly well with less pain LT shldr. She was able to perform AAROM and AROM exs f/b PROM. Pt still with mm guarding and did better with AAROM and contract / relax technique. PROM 145  degrees flexion and ER 58 degrees again. Normal modality response today    Personal Factors and Comorbidities  Comorbidity 1;Comorbidity 2    Comorbidities  TKA, lumbar surgery, OA.    Examination-Activity Limitations  Lift;Carry;Dressing;Caring for Others;Sleep    Examination-Participation Restrictions  Other    Stability/Clinical Decision Making  Stable/Uncomplicated    Rehab Potential  Good    PT Frequency  2x / week    PT Duration  8 weeks    PT Treatment/Interventions  ADLs/Self Care Home Management;Cryotherapy;Electrical Stimulation;Ultrasound;Moist Heat;Therapeutic activities;Therapeutic exercise;Manual techniques;Patient/family education;Passive range of motion;Dry needling;Vasopneumatic Device;Taping;Functional mobility training    PT Next Visit Plan  AAROM pulleys, seated and standing UE ranger. AROM    PT Home Exercise Plan  pendulums, ball squeezes       Patient will benefit from skilled therapeutic intervention in order to improve the following deficits and impairments:  Pain, Postural dysfunction, Impaired UE functional use, Decreased activity tolerance, Decreased strength, Decreased range of motion, Increased edema  Visit Diagnosis: Muscle weakness (generalized)  Stiffness of left shoulder, not elsewhere classified  Chronic left shoulder pain  Acute pain of right shoulder  Chronic right shoulder pain  Acute left-sided low back pain with left-sided  sciatica     Problem List Patient Active Problem List   Diagnosis Date Noted  .  DJD (degenerative joint disease) of knee 09/09/2013  . Insomnia   . Depression with anxiety   . Hypothyroid   . Lymphocytic colitis   . Left knee DJD     Burnham Trost,[name], PTA 07/18/2019, 4:25 PM  [facility] [address] [place], [place], [address] Phone: [phone]   Fax:  [phone]  Name: Theresa Reese MRN: [id number] Date of Birth: 08-30-1950

## 2019-07-23 ENCOUNTER — Ambulatory Visit: Payer: Medicare PPO | Attending: Orthopaedic Surgery | Admitting: *Deleted

## 2019-07-23 ENCOUNTER — Other Ambulatory Visit: Payer: Self-pay

## 2019-07-23 DIAGNOSIS — G8929 Other chronic pain: Secondary | ICD-10-CM | POA: Diagnosis present

## 2019-07-23 DIAGNOSIS — M25612 Stiffness of left shoulder, not elsewhere classified: Secondary | ICD-10-CM | POA: Diagnosis present

## 2019-07-23 DIAGNOSIS — M25512 Pain in left shoulder: Secondary | ICD-10-CM

## 2019-07-23 DIAGNOSIS — M6281 Muscle weakness (generalized): Secondary | ICD-10-CM | POA: Insufficient documentation

## 2019-07-23 NOTE — Therapy (Signed)
[facility] [address] [place], [place], [address] Phone: [phone]   Fax:  [phone]  Physical Therapy Treatment  Patient Details  Name: Theresa Reese MRN: [id number] Date of Birth: March 29, 1950 Referring Provider (PT): Ramond Marrow   Encounter Date: 07/23/2019  PT End of Session - 07/23/19 1527    Visit Number  14    Number of Visits  16    Date for PT Re-Evaluation  08/02/19    Authorization Type  Dash score   43.9 03-28-19    PT Start Time  1515    PT Stop Time  1610    PT Time Calculation (min)  55 min       Past Medical History:  Diagnosis Date  . GERD (gastroesophageal reflux disease)   . Hypercholesteremia   . Hypertension   . Hypothyroid   . Insomnia   . Left knee DJD   . Left shoulder pain   . Lymphocytic colitis     Past Surgical History:  Procedure Laterality Date  . BACK SURGERY    . CESAREAN SECTION  1987  . CESAREAN SECTION  1990  . COLONOSCOPY    . EYE SURGERY Bilateral    cataract  . JOINT REPLACEMENT Left   . KNEE ARTHROSCOPY W/ MENISCECTOMY Left 2014   Dr Shelle Iron  . low back surgery  1997   Dr Jule Ser  . low back surgery  1994   Dr Elesa Hacker  . RETINAL DETACHMENT SURGERY Right 2013  . SHOULDER ARTHROSCOPY WITH ROTATOR CUFF REPAIR Left 05/22/2019   Procedure: LEFT SHOULDER ARTHROSCOPY WITH ROTATOR CUFF REPAIR;  Surgeon: Bjorn Pippin, MD;  Location: [facility];  Service: Orthopedics;  Laterality: Left;  . SHOULDER ARTHROSCOPY WITH SUBACROMIAL DECOMPRESSION AND BICEP TENDON REPAIR Left 05/22/2019   Procedure: LEFT SHOULDER ARTHROSCOPY  DEBRIDEMENT WITH SUBACROMIAL DECOMPRESSION AND BICEP TENODESIS;  Surgeon: Bjorn Pippin, MD;  Location: [facility];  Service: Orthopedics;  Laterality: Left;  . TOTAL KNEE ARTHROPLASTY Left 09/09/2013   Procedure: LEFT TOTAL KNEE ARTHROPLASTY;  Surgeon: Nilda Simmer, MD;  Location[facility];  Service: Orthopedics;  Laterality: Left;    There  were no vitals filed for this visit.  Subjective Assessment - 07/23/19 1446    Subjective  COVID-19 screen performed prior to patient entering clinic.  LT shldr stays sore    Pertinent History  TKA, lumbar surgery, OA.    Patient Stated Goals  Be able to use my arm    Currently in Pain?  Yes    Pain Score  6     Pain Location  Shoulder    Pain Orientation  Left    Pain Descriptors / Indicators  Aching;Sore    Pain Type  Surgical pain                       [facility] Adult PT Treatment/Exercise - 07/23/19 0001      Exercises   Exercises  Shoulder      Shoulder Exercises: Supine   Protraction  AROM;Left;10 reps;20 reps    Other Supine Exercises  cw/ccw 2x10 each      Shoulder Exercises: Pulleys   Flexion  5 minutes    Other Pulley Exercises  standing UE ranger x 6 mins flexion/ ext , CW, CCW, Standing flexion      Electrical Stimulation   Electrical Stimulation Location  L shoulder  premod 80-150hz  x 15 mins  Engineering geologist Parameters  800-150hz  x 15 mins    Electrical Stimulation Goals  Pain      Vasopneumatic   Number Minutes Vasopneumatic   15 minutes    Vasopnuematic Location   Shoulder    Vasopneumatic Pressure  Low    Vasopneumatic Temperature   36      Manual Therapy   Manual Therapy  Passive ROM    Manual therapy comments  PROM in standing for elevation at end range    Passive ROM  PROM to LT shldr in supine for ER , flexion , and abd with oscillations b/w bouts for relation/ decrease pain. IR isometrics for contract /relax stretcing to increase ER                  PT Long Term Goals - 07/04/19 1732      PT LONG TERM GOAL #1   Title  Patient will be independent with HEP    Time  8    Period  Weeks    Status  On-going      PT LONG TERM GOAL #2   Title  Perform basci ADL's with pain </= 3/10.    Baseline  unable to perform ADL's currently    Time  8    Period  Weeks    Status   On-going      PT LONG TERM GOAL #3   Title  Pt will improve her L shoulder flexion >/= 140 degrees.    Baseline  see flowsheets    Time  8    Period  Weeks    Status  On-going      PT LONG TERM GOAL #4   Title  Pt will improve her left ER to 40 degrees (limited by protocol).    Baseline  see slow sheets    Time  8    Period  Weeks    Status  On-going      PT LONG TERM GOAL #5   Title  pt will improve grip strength on L hand to >/= 50 ppsi.    Baseline  32 ppsi    Period  Weeks    Status  On-going            Plan - 07/23/19 1527    Clinical Impression Statement  Pt arrived today with LT shldr still still stiff and sore. She was guided through AAROM/ AROM, and PROM was performed. Pt did fairly well with standing elevation, but limited to 142 degrees and ER to 50 degrees with fim end feels. Pt OOT x 1 week and was encouraged to cont with HE{P for ROM progression    Personal Factors and Comorbidities  Comorbidity 1;Comorbidity 2    Comorbidities  TKA, lumbar surgery, OA.    Examination-Activity Limitations  Lift;Carry;Dressing;Caring for Others;Sleep    Rehab Potential  Good    PT Frequency  2x / week    PT Duration  8 weeks    PT Treatment/Interventions  ADLs/Self Care Home Management;Cryotherapy;Electrical Stimulation;Ultrasound;Moist Heat;Therapeutic activities;Therapeutic exercise;Manual techniques;Patient/family education;Passive range of motion;Dry needling;Vasopneumatic Device;Taping;Functional mobility training    PT Next Visit Plan  AAROM pulleys, seated and standing UE ranger. AROM, PROM    PT Home Exercise Plan  pendulums, ball squeezes       Patient will benefit from skilled therapeutic intervention in order to improve the following deficits and impairments:  Pain, Postural dysfunction, Impaired UE functional use, Decreased activity  tolerance, Decreased strength, Decreased range of motion, Increased edema  Visit Diagnosis: Muscle weakness  (generalized)  Stiffness of left shoulder, not elsewhere classified  Chronic left shoulder pain     Problem List Patient Active Problem List   Diagnosis Date Noted  . DJD (degenerative joint disease) of knee 09/09/2013  . Insomnia   . Depression with anxiety   . Hypothyroid   . Lymphocytic colitis   . Left knee DJD     Theresa Reese,[name], PTA 07/23/2019, 6:33 PM  [facility] [address] [place], [place], [address] Phone: [phone]   Fax:  [phone]  Name: Theresa Reese MRN: [id number] Date of Birth: 07/12/1950

## 2019-07-25 ENCOUNTER — Encounter: Payer: Medicare PPO | Admitting: *Deleted

## 2019-07-30 ENCOUNTER — Ambulatory Visit: Payer: Medicare PPO | Admitting: *Deleted

## 2019-07-30 ENCOUNTER — Other Ambulatory Visit: Payer: Self-pay

## 2019-07-30 DIAGNOSIS — M25512 Pain in left shoulder: Secondary | ICD-10-CM

## 2019-07-30 DIAGNOSIS — M6281 Muscle weakness (generalized): Secondary | ICD-10-CM

## 2019-07-30 DIAGNOSIS — M25612 Stiffness of left shoulder, not elsewhere classified: Secondary | ICD-10-CM

## 2019-07-30 NOTE — Therapy (Signed)
[facility] [address] [place], [place], [address] Phone: [phone]   Fax:  [phone]  Physical Therapy Treatment  Patient Details  Name: Theresa Reese MRN: [id number] Date of Birth: [birth year] Referring Provider (PT): Ramond Marrow   Encounter Date: 07/30/2019  PT End of Session - 07/30/19 1451    Visit Number  15    Number of Visits  16    Date for PT Re-Evaluation  08/02/19    Authorization Type  Dash score   43.9 03-28-19    PT Start Time  1430    PT Stop Time  1526    PT Time Calculation (min)  56 min       Past Medical History:  Diagnosis Date  . GERD (gastroesophageal reflux disease)   . Hypercholesteremia   . Hypertension   . Hypothyroid   . Insomnia   . Left knee DJD   . Left shoulder pain   . Lymphocytic colitis     Past Surgical History:  Procedure Laterality Date  . BACK SURGERY    . CESAREAN SECTION  1987  . CESAREAN SECTION  1990  . COLONOSCOPY    . EYE SURGERY Bilateral    cataract  . JOINT REPLACEMENT Left   . KNEE ARTHROSCOPY W/ MENISCECTOMY Left 2014   Dr Shelle Iron  . low back surgery  1997   Dr Jule Ser  . low back surgery  1994   Dr Elesa Hacker  . RETINAL DETACHMENT SURGERY Right 2013  . SHOULDER ARTHROSCOPY WITH ROTATOR CUFF REPAIR Left 05/22/2019   Procedure: LEFT SHOULDER ARTHROSCOPY WITH ROTATOR CUFF REPAIR;  Surgeon: Bjorn Pippin, MD;  Location: [facility];  Service: Orthopedics;  Laterality: Left;  . SHOULDER ARTHROSCOPY WITH SUBACROMIAL DECOMPRESSION AND BICEP TENDON REPAIR Left 05/22/2019   Procedure: LEFT SHOULDER ARTHROSCOPY  DEBRIDEMENT WITH SUBACROMIAL DECOMPRESSION AND BICEP TENODESIS;  Surgeon: Bjorn Pippin, MD;  Location: [facility];  Service: Orthopedics;  Laterality: Left;  . TOTAL KNEE ARTHROPLASTY Left 09/09/2013   Procedure: LEFT TOTAL KNEE ARTHROPLASTY;  Surgeon: Nilda Simmer, MD;  [facility];  Service: Orthopedics;  Laterality: Left;    There  were no vitals filed for this visit.  Subjective Assessment - 07/30/19 1524    Subjective  COVID-19 screen performed prior to patient entering clinic.  LT shldr stays sore Doing ok. 2-3/10    Pertinent History  TKA, lumbar surgery, OA.    Patient Stated Goals  Be able to use my arm    Currently in Pain?  Yes    Pain Score  3     Pain Location  Shoulder    Pain Orientation  Left    Pain Descriptors / Indicators  Aching;Sore    Pain Onset  More than a month ago                       [facility] Adult PT Treatment/Exercise - 07/30/19 0001      Exercises   Exercises  Shoulder      Shoulder Exercises: Standing   Flexion  Left;20 reps;10 reps   to shldr level 3x10     Shoulder Exercises: Pulleys   Flexion  5 minutes    Other Pulley Exercises  standing UE ranger x 6 mins flexion/ ext , CW, CCW, Standing flexion      Shoulder Exercises: Therapy Ball   Flexion  Left;15 reps   on wall  Modalities   Modalities  Vasopneumatic;Electrical Stimulation      Electrical Stimulation   Electrical Stimulation Location  L shoulder  premod 80-150hz  x 15 mins    Electrical Stimulation Action  premod    Electrical Stimulation Parameters  80-150hz  x 15 mins    Electrical Stimulation Goals  Pain      Vasopneumatic   Number Minutes Vasopneumatic   15 minutes    Vasopnuematic Location   Shoulder    Vasopneumatic Pressure  Low    Vasopneumatic Temperature   36      Manual Therapy   Manual Therapy  Passive ROM    Passive ROM  PROM to LT shldr in supine for ER , flexion , and abd with oscillations b/w bouts for relation/ decrease pain. IR isometrics for contract /relax stretcing to increase ER                  PT Long Term Goals - 07/04/19 1732      PT LONG TERM GOAL #1   Title  Patient will be independent with HEP    Time  8    Period  Weeks    Status  On-going      PT LONG TERM GOAL #2   Title  Perform basci ADL's with pain </= 3/10.    Baseline  unable to  perform ADL's currently    Time  8    Period  Weeks    Status  On-going      PT LONG TERM GOAL #3   Title  Pt will improve her L shoulder flexion >/= 140 degrees.    Baseline  see flowsheets    Time  8    Period  Weeks    Status  On-going      PT LONG TERM GOAL #4   Title  Pt will improve her left ER to 40 degrees (limited by protocol).    Baseline  see slow sheets    Time  8    Period  Weeks    Status  On-going      PT LONG TERM GOAL #5   Title  pt will improve grip strength on L hand to >/= 50 ppsi.    Baseline  32 ppsi    Period  Weeks    Status  On-going            Plan - 07/30/19 1744    Clinical Impression Statement  Pt arrived today doing fairly well with decreased pain LT shldr  and able to raise Lt arm to 120 degrees with no shldr hiking. HEP for standing flexion added. Passive flexion to 145 degrees and ER to 55 degrees today. Normal modality response today.    Comorbidities  TKA, lumbar surgery, OA.    Examination-Activity Limitations  Lift;Carry;Dressing;Caring for Others;Sleep    Stability/Clinical Decision Making  Stable/Uncomplicated    Rehab Potential  Good    PT Frequency  2x / week    PT Treatment/Interventions  ADLs/Self Care Home Management;Cryotherapy;Electrical Stimulation;Ultrasound;Moist Heat;Therapeutic activities;Therapeutic exercise;Manual techniques;Patient/family education;Passive range of motion;Dry needling;Vasopneumatic Device;Taping;Functional mobility training    PT Next Visit Plan  AAROM pulleys, seated and standing UE ranger. AROM, PROM       Patient will benefit from skilled therapeutic intervention in order to improve the following deficits and impairments:     Visit Diagnosis: Muscle weakness (generalized)  Stiffness of left shoulder, not elsewhere classified  Chronic left shoulder pain     Problem List  Patient Active Problem List   Diagnosis Date Noted  . DJD (degenerative joint disease) of knee 09/09/2013  . Insomnia    . Depression with anxiety   . Hypothyroid   . Lymphocytic colitis   . Left knee DJD     Daylen Hack,[name], PTA 07/30/2019, 5:49 PM  [facility] [address] [place], [place], [address] Phone: [phone]   Fax:  [phone]  Name: Theresa Reese MRN: [id number] Date of Birth: Jun 18, 1950

## 2019-08-01 ENCOUNTER — Ambulatory Visit: Payer: Medicare PPO | Admitting: *Deleted

## 2019-08-01 ENCOUNTER — Other Ambulatory Visit: Payer: Self-pay

## 2019-08-01 DIAGNOSIS — G8929 Other chronic pain: Secondary | ICD-10-CM

## 2019-08-01 DIAGNOSIS — M6281 Muscle weakness (generalized): Secondary | ICD-10-CM

## 2019-08-01 DIAGNOSIS — M25612 Stiffness of left shoulder, not elsewhere classified: Secondary | ICD-10-CM

## 2019-08-01 NOTE — Therapy (Signed)
[facility] [address] [place], [place], [address] Phone: [phone]   Fax:  [phone]  Physical Therapy Treatment  Patient Details  Name: Theresa Reese MRN: [id number] Date of Birth: 02-21-1951 Referring Provider (PT): Ramond Marrow   Encounter Date: 08/01/2019  PT End of Session - 08/01/19 1448    Visit Number  16    Number of Visits  16    Date for PT Re-Evaluation  08/02/19    Authorization Type  Dash score   43.9 03-28-19    PT Start Time  1430    PT Stop Time  1525    PT Time Calculation (min)  55 min       Past Medical History:  Diagnosis Date  . GERD (gastroesophageal reflux disease)   . Hypercholesteremia   . Hypertension   . Hypothyroid   . Insomnia   . Left knee DJD   . Left shoulder pain   . Lymphocytic colitis     Past Surgical History:  Procedure Laterality Date  . BACK SURGERY    . CESAREAN SECTION  1987  . CESAREAN SECTION  1990  . COLONOSCOPY    . EYE SURGERY Bilateral    cataract  . JOINT REPLACEMENT Left   . KNEE ARTHROSCOPY W/ MENISCECTOMY Left 2014   Dr Shelle Iron  . low back surgery  1997   Dr Jule Ser  . low back surgery  1994   Dr Elesa Hacker  . RETINAL DETACHMENT SURGERY Right 2013  . SHOULDER ARTHROSCOPY WITH ROTATOR CUFF REPAIR Left 05/22/2019   Procedure: LEFT SHOULDER ARTHROSCOPY WITH ROTATOR CUFF REPAIR;  Surgeon: Bjorn Pippin, MD;  Location: [facility];  Service: Orthopedics;  Laterality: Left;  . SHOULDER ARTHROSCOPY WITH SUBACROMIAL DECOMPRESSION AND BICEP TENDON REPAIR Left 05/22/2019   Procedure: LEFT SHOULDER ARTHROSCOPY  DEBRIDEMENT WITH SUBACROMIAL DECOMPRESSION AND BICEP TENODESIS;  Surgeon: Bjorn Pippin, MD;  Location: [facility];  Service: Orthopedics;  Laterality: Left;  . TOTAL KNEE ARTHROPLASTY Left 09/09/2013   Procedure: LEFT TOTAL KNEE ARTHROPLASTY;  Surgeon: Nilda Simmer, MD;  Location[facility];  Service: Orthopedics;  Laterality: Left;    There  were no vitals filed for this visit.  Subjective Assessment - 08/01/19 1447    Subjective  COVID-19 screen performed prior to patient entering clinic.  LT shldr stays sore Doing ok. 6/10. Had to make a bed and cook this week    Pertinent History  TKA, lumbar surgery, OA.    Patient Stated Goals  Be able to use my arm    Pain Score  6     Pain Location  Shoulder    Pain Orientation  Left    Pain Descriptors / Indicators  Aching;Sore    Pain Onset  More than a month ago                        [facility] Adult PT Treatment/Exercise - 08/01/19 0001      Exercises   Exercises  Shoulder      Shoulder Exercises: Supine   Protraction  AROM;Left;10 reps;20 reps      Shoulder Exercises: Prone   Extension  AROM;Left;15 reps    Horizontal ABduction 1  AROM;Left;15 reps      Shoulder Exercises: Standing   Flexion  Left;20 reps;10 reps   to shldr level 3x10     Shoulder Exercises: Pulleys   Flexion  5 minutes  Other Pulley Exercises  standing UE ranger x 6 mins flexion/ ext , CW, CCW, Standing flexion      Modalities   Modalities  Vasopneumatic;Electrical Stimulation      Electrical Stimulation   Electrical Stimulation Location  L shoulder  premod 80-150hz  x 15 mins    Electrical Stimulation Action  premod    Electrical Stimulation Parameters  80-150hz  x 15 mins    Electrical Stimulation Goals  Pain      Vasopneumatic   Number Minutes Vasopneumatic   15 minutes    Vasopnuematic Location   Shoulder    Vasopneumatic Pressure  Low    Vasopneumatic Temperature   34      Manual Therapy   Manual Therapy  Passive ROM    Passive ROM  PROM to LT shldr in supine for ER/IR  , flexion , and abd with oscillations b/w bouts for relation/ decrease pain. IR isometrics for contract /relax stretcing to increase ER.                   PT Long Term Goals - 07/04/19 1732      PT LONG TERM GOAL #1   Title  Patient will be independent with HEP    Time  8    Period  Weeks     Status  On-going      PT LONG TERM GOAL #2   Title  Perform basci ADL's with pain </= 3/10.    Baseline  unable to perform ADL's currently    Time  8    Period  Weeks    Status  On-going      PT LONG TERM GOAL #3   Title  Pt will improve her L shoulder flexion >/= 140 degrees.    Baseline  see flowsheets    Time  8    Period  Weeks    Status  On-going      PT LONG TERM GOAL #4   Title  Pt will improve her left ER to 40 degrees (limited by protocol).    Baseline  see slow sheets    Time  8    Period  Weeks    Status  On-going      PT LONG TERM GOAL #5   Title  pt will improve grip strength on L hand to >/= 50 ppsi.    Baseline  32 ppsi    Period  Weeks    Status  On-going            Plan - 08/01/19 1721    Clinical Impression Statement  Pt arrived today doing fair, but very sore from working her LT shldr at home yesterday. She was able to continue AROM exs in standing and prone as well as supine today. PROM for flexion and ER were about the same today, but  c/o increased pain in and along bicep mm. Normal modality response    Personal Factors and Comorbidities  Comorbidity 1;Comorbidity 2    Comorbidities  TKA, lumbar surgery, OA.    Examination-Activity Limitations  Lift;Carry;Dressing;Caring for Others;Sleep    Stability/Clinical Decision Making  Stable/Uncomplicated    Rehab Potential  Good    PT Frequency  2x / week    PT Duration  8 weeks    PT Treatment/Interventions  ADLs/Self Care Home Management;Cryotherapy;Electrical Stimulation;Ultrasound;Moist Heat;Therapeutic activities;Therapeutic exercise;Manual techniques;Patient/family education;Passive range of motion;Dry needling;Vasopneumatic Device;Taping;Functional mobility training    PT Next Visit Plan  AAROM pulleys, seated and standing UE  ranger. AROM, PROM    Consulted and Agree with Plan of Care  Patient       Patient will benefit from skilled therapeutic intervention in order to improve the following  deficits and impairments:  Pain, Postural dysfunction, Impaired UE functional use, Decreased activity tolerance, Decreased strength, Decreased range of motion, Increased edema  Visit Diagnosis: Muscle weakness (generalized)  Stiffness of left shoulder, not elsewhere classified  Chronic left shoulder pain     Problem List Patient Active Problem List   Diagnosis Date Noted  . DJD (degenerative joint disease) of knee 09/09/2013  . Insomnia   . Depression with anxiety   . Hypothyroid   . Lymphocytic colitis   . Left knee DJD     Coen Miyasato,[name], PTA 08/01/2019, 5:25 PM  [facility] [address] [place], [place], [address] Phone: [phone]   Fax:  [phone]  Name: Theresa Reese MRN: [id number] Date of Birth: 03/05/1951

## 2019-08-06 ENCOUNTER — Other Ambulatory Visit: Payer: Self-pay

## 2019-08-06 ENCOUNTER — Ambulatory Visit: Payer: Medicare PPO | Admitting: *Deleted

## 2019-08-06 DIAGNOSIS — M6281 Muscle weakness (generalized): Secondary | ICD-10-CM | POA: Diagnosis not present

## 2019-08-06 DIAGNOSIS — M25512 Pain in left shoulder: Secondary | ICD-10-CM

## 2019-08-06 DIAGNOSIS — M25612 Stiffness of left shoulder, not elsewhere classified: Secondary | ICD-10-CM

## 2019-08-06 NOTE — Therapy (Signed)
[facility] [address] [place], [place], [address] Phone: [phone]   Fax:  [phone]  Physical Therapy Treatment  Patient Details  Name: Theresa Reese MRN: [id number] Date of Birth: Dec 24, 1950 Referring Provider (PT): Ramond Marrow   Encounter Date: 08/06/2019  PT End of Session - 08/06/19 1505    Visit Number  17    Number of Visits  24    Date for PT Re-Evaluation  08/30/19    Authorization Type  Dash score   43.9 03-28-19    PT Start Time  1430    PT Stop Time  1527    PT Time Calculation (min)  57 min       Past Medical History:  Diagnosis Date  . GERD (gastroesophageal reflux disease)   . Hypercholesteremia   . Hypertension   . Hypothyroid   . Insomnia   . Left knee DJD   . Left shoulder pain   . Lymphocytic colitis     Past Surgical History:  Procedure Laterality Date  . BACK SURGERY    . CESAREAN SECTION  1987  . CESAREAN SECTION  1990  . COLONOSCOPY    . EYE SURGERY Bilateral    cataract  . JOINT REPLACEMENT Left   . KNEE ARTHROSCOPY W/ MENISCECTOMY Left 2014   Dr Shelle Iron  . low back surgery  1997   Dr Jule Ser  . low back surgery  1994   Dr Elesa Hacker  . RETINAL DETACHMENT SURGERY Right 2013  . SHOULDER ARTHROSCOPY WITH ROTATOR CUFF REPAIR Left 05/22/2019   Procedure: LEFT SHOULDER ARTHROSCOPY WITH ROTATOR CUFF REPAIR;  Surgeon: Bjorn Pippin, MD;  Location: [facility];  Service: Orthopedics;  Laterality: Left;  . SHOULDER ARTHROSCOPY WITH SUBACROMIAL DECOMPRESSION AND BICEP TENDON REPAIR Left 05/22/2019   Procedure: LEFT SHOULDER ARTHROSCOPY  DEBRIDEMENT WITH SUBACROMIAL DECOMPRESSION AND BICEP TENODESIS;  Surgeon: Bjorn Pippin, MD;  [facility];  Service: Orthopedics;  Laterality: Left;  . TOTAL KNEE ARTHROPLASTY Left 09/09/2013   Procedure: LEFT TOTAL KNEE ARTHROPLASTY;  Surgeon: Nilda Simmer, MD;  Location: [facility];  Service: Orthopedics;  Laterality: Left;    There  were no vitals filed for this visit.  Subjective Assessment - 08/06/19 1444    Subjective  COVID-19 screen performed prior to patient entering clinic.  LT shldr stays sore Doing ok. 4/10.    Pertinent History  TKA, lumbar surgery, OA.    Currently in Pain?  Yes    Pain Score  4     Pain Location  Shoulder    Pain Orientation  Left    Pain Descriptors / Indicators  Aching;Sore    Pain Type  Surgical pain                        [facility] Adult PT Treatment/Exercise - 08/06/19 0001      Exercises   Exercises  Shoulder      Shoulder Exercises: Supine   Protraction  AROM;Left;10 reps;20 reps    Flexion  AROM;Left;10 reps    Other Supine Exercises  CW, CCW circles at 100 degrees elevation      Shoulder Exercises: Sidelying   External Rotation  AROM;Left;20 reps    Flexion  AROM;Left;20 reps      Shoulder Exercises: Pulleys   Flexion  5 minutes    Other Pulley Exercises  standing UE ranger x 6 mins flexion/ ext , CW, CCW, Standing flexion  Modalities   Modalities  Vasopneumatic;Electrical Stimulation      Electrical Stimulation   Electrical Stimulation Location  L shoulder  premod 80-150hz  x 15 mins    Electrical Stimulation Goals  Pain      Vasopneumatic   Number Minutes Vasopneumatic   15 minutes    Vasopnuematic Location   Shoulder    Vasopneumatic Pressure  Low    Vasopneumatic Temperature   34      Manual Therapy   Manual Therapy  Passive ROM    Passive ROM  PROM to LT shldr in supine for ER/IR  , flexion , and abd with oscillations b/w bouts for relation/ decrease pain. IR isometrics for contract /relax stretcing to increase ER.                   PT Long Term Goals - 07/04/19 1732      PT LONG TERM GOAL #1   Title  Patient will be independent with HEP    Time  8    Period  Weeks    Status  On-going      PT LONG TERM GOAL #2   Title  Perform basci ADL's with pain </= 3/10.    Baseline  unable to perform ADL's currently    Time  8     Period  Weeks    Status  On-going      PT LONG TERM GOAL #3   Title  Pt will improve her L shoulder flexion >/= 140 degrees.    Baseline  see flowsheets    Time  8    Period  Weeks    Status  On-going      PT LONG TERM GOAL #4   Title  Pt will improve her left ER to 40 degrees (limited by protocol).    Baseline  see slow sheets    Time  8    Period  Weeks    Status  On-going      PT LONG TERM GOAL #5   Title  pt will improve grip strength on L hand to >/= 50 ppsi.    Baseline  32 ppsi    Period  Weeks    Status  On-going            Plan - 08/06/19 1731    Clinical Impression Statement  Pt arrived today doing bbetter than last Rx with less pain LT shldr. Pt was guided through AROM, AAROM exs f/b PROM. She did well with therex and was most challenged by sidelying elevation and ER. Normal modality response today    Personal Factors and Comorbidities  Comorbidity 1;Comorbidity 2    Comorbidities  TKA, lumbar surgery, OA.    Examination-Activity Limitations  Lift;Carry;Dressing;Caring for Others;Sleep    Examination-Participation Restrictions  Other    Stability/Clinical Decision Making  Stable/Uncomplicated    Rehab Potential  Good    PT Frequency  2x / week    PT Duration  8 weeks    PT Treatment/Interventions  ADLs/Self Care Home Management;Cryotherapy;Electrical Stimulation;Ultrasound;Moist Heat;Therapeutic activities;Therapeutic exercise;Manual techniques;Patient/family education;Passive range of motion;Dry needling;Vasopneumatic Device;Taping;Functional mobility training    PT Next Visit Plan  AAROM pulleys, seated and standing UE ranger. AROM, PROM       Patient will benefit from skilled therapeutic intervention in order to improve the following deficits and impairments:  Pain, Postural dysfunction, Impaired UE functional use, Decreased activity tolerance, Decreased strength, Decreased range of motion, Increased edema  Visit Diagnosis: Muscle weakness  (  generalized)  Stiffness of left shoulder, not elsewhere classified  Chronic left shoulder pain     Problem List Patient Active Problem List   Diagnosis Date Noted  . DJD (degenerative joint disease) of knee 09/09/2013  . Insomnia   . Depression with anxiety   . Hypothyroid   . Lymphocytic colitis   . Left knee DJD     Biana Haggar,[name], PTA 08/06/2019, 5:45 PM  [facility] [address] [place], [place], [address] Phone: [phone]   Fax:  [phone]  Name: Theresa Reese MRN: [id number] Date of Birth: 1950-09-07

## 2019-08-08 ENCOUNTER — Ambulatory Visit: Payer: Medicare PPO | Admitting: *Deleted

## 2019-08-08 ENCOUNTER — Other Ambulatory Visit: Payer: Self-pay

## 2019-08-08 DIAGNOSIS — M6281 Muscle weakness (generalized): Secondary | ICD-10-CM | POA: Diagnosis not present

## 2019-08-08 DIAGNOSIS — M25512 Pain in left shoulder: Secondary | ICD-10-CM

## 2019-08-08 DIAGNOSIS — M25612 Stiffness of left shoulder, not elsewhere classified: Secondary | ICD-10-CM

## 2019-08-08 NOTE — Therapy (Signed)
[facility] [place], [place], [address] Phone: [phone]   Fax:  [phone]  Physical Therapy Treatment  Patient Details  Name: Theresa Reese MRN: [id number] Date of Birth: [birth year] Referring Provider (PT): Ophelia Charter   Encounter Date: 08/08/2019  PT End of Session - 08/08/19 1448    Visit Number  18    Number of Visits  24    Date for PT Re-Evaluation  08/30/19    Authorization Type  Dash score   43.9 03-28-19    PT Start Time  1430    PT Stop Time  1525    PT Time Calculation (min)  55 min       Past Medical History:  Diagnosis Date  . GERD (gastroesophageal reflux disease)   . Hypercholesteremia   . Hypertension   . Hypothyroid   . Insomnia   . Left knee DJD   . Left shoulder pain   . Lymphocytic colitis     Past Surgical History:  Procedure Laterality Date  . BACK SURGERY    . CESAREAN SECTION  1987  . CESAREAN SECTION  1990  . COLONOSCOPY    . EYE SURGERY Bilateral    cataract  . JOINT REPLACEMENT Left   . KNEE ARTHROSCOPY W/ MENISCECTOMY Left 2014   Dr Tonita Cong  . low back surgery  1997   Dr Rita Ohara  . low back surgery  1994   Dr Rolin Barry  . RETINAL DETACHMENT SURGERY Right 2013  . SHOULDER ARTHROSCOPY WITH ROTATOR CUFF REPAIR Left 05/22/2019   Procedure: LEFT SHOULDER ARTHROSCOPY WITH ROTATOR CUFF REPAIR;  Surgeon: Hiram Gash, MD;  Location: [place];  Service: Orthopedics;  Laterality: Left;  . SHOULDER ARTHROSCOPY WITH SUBACROMIAL DECOMPRESSION AND BICEP TENDON REPAIR Left 05/22/2019   Procedure: LEFT SHOULDER ARTHROSCOPY  DEBRIDEMENT WITH SUBACROMIAL DECOMPRESSION AND BICEP TENODESIS;  Surgeon: Hiram Gash, MD;  Location: [place];  Service: Orthopedics;  Laterality: Left;  . TOTAL KNEE ARTHROPLASTY Left 09/09/2013   Procedure: LEFT TOTAL KNEE ARTHROPLASTY;  Surgeon: Lorn Junes, MD;  Location: [place];  Service: Orthopedics;  Laterality: Left;    There  were no vitals filed for this visit.  Subjective Assessment - 08/08/19 1520    Subjective  COVID-19 screen performed prior to patient entering clinic.  LT shldr stays sore Doing ok. 4/10, but doing better    Pertinent History  TKA, lumbar surgery, OA.    Patient Stated Goals  Be able to use my arm    Currently in Pain?  Yes    Pain Score  4     Pain Location  Shoulder    Pain Orientation  Left    Pain Descriptors / Indicators  Aching;Sore    Pain Type  Surgical pain    Pain Onset  More than a month ago                        [facility] Adult PT Treatment/Exercise - 08/08/19 0001      Exercises   Exercises  Shoulder      Shoulder Exercises: Sidelying   External Rotation  AROM;Left;20 reps    Flexion  AROM;Left;20 reps    ABduction  Left;AROM;20 reps      Shoulder Exercises: Standing   Horizontal ABduction  AROM;Both;10 reps    Flexion  Left;20 reps;10 reps      Shoulder Exercises: Pulleys   Flexion  5  minutes    Other Pulley Exercises  standing UE ranger x 6 mins flexion/ ext , CW, CCW, Standing flexion      Shoulder Exercises: ROM/Strengthening   UBE (Upper Arm Bike)   4 mins      Electrical Stimulation   Electrical Stimulation Location  L shoulder  premod 80-150hz  x 15 mins    Electrical Stimulation Goals  Pain      Vasopneumatic   Number Minutes Vasopneumatic   15 minutes    Vasopnuematic Location   Shoulder    Vasopneumatic Pressure  Low    Vasopneumatic Temperature   34      Manual Therapy   Manual Therapy  Passive ROM    Passive ROM  PROM to LT shldr in supine for ER/IR  , flexion , and abd with oscillations b/w bouts for relation/ decrease pain. IR isometrics for contract /relax stretcing to increase ER.                   PT Long Term Goals - 07/04/19 1732      PT LONG TERM GOAL #1   Title  Patient will be independent with HEP    Time  8    Period  Weeks    Status  On-going      PT LONG TERM GOAL #2   Title  Perform basci ADL's  with pain </= 3/10.    Baseline  unable to perform ADL's currently    Time  8    Period  Weeks    Status  On-going      PT LONG TERM GOAL #3   Title  Pt will improve her L shoulder flexion >/= 140 degrees.    Baseline  see flowsheets    Time  8    Period  Weeks    Status  On-going      PT LONG TERM GOAL #4   Title  Pt will improve her left ER to 40 degrees (limited by protocol).    Baseline  see slow sheets    Time  8    Period  Weeks    Status  On-going      PT LONG TERM GOAL #5   Title  pt will improve grip strength on L hand to >/= 50 ppsi.    Baseline  32 ppsi    Period  Weeks    Status  On-going            Plan - 08/08/19 1522    Clinical Impression Statement  Pt arrived today with LT shldr  doing fairly well. She was guided through AROM, AAROM exs f/b PROM. Pt did better with sidelying exs with less UE shaking. ER with PROM was 60 degrees today and tolerated better.    Personal Factors and Comorbidities  Comorbidity 1;Comorbidity 2    Comorbidities  TKA, lumbar surgery, OA.    Examination-Activity Limitations  Lift;Carry;Dressing;Caring for Others;Sleep    Examination-Participation Restrictions  Other    Rehab Potential  Good    PT Frequency  2x / week    PT Duration  8 weeks    PT Treatment/Interventions  ADLs/Self Care Home Management;Cryotherapy;Electrical Stimulation;Ultrasound;Moist Heat;Therapeutic activities;Therapeutic exercise;Manual techniques;Patient/family education;Passive range of motion;Dry needling;Vasopneumatic Device;Taping;Functional mobility training    PT Next Visit Plan  AAROM pulleys, seated and standing UE ranger. AROM all positions, PROM       Patient will benefit from skilled therapeutic intervention in order to improve the following deficits and  impairments:  Pain, Postural dysfunction, Impaired UE functional use, Decreased activity tolerance, Decreased strength, Decreased range of motion, Increased edema  Visit Diagnosis: Muscle  weakness (generalized)  Stiffness of left shoulder, not elsewhere classified  Chronic left shoulder pain     Problem List Patient Active Problem List   Diagnosis Date Noted  . DJD (degenerative joint disease) of knee 09/09/2013  . Insomnia   . Depression with anxiety   . Hypothyroid   . Lymphocytic colitis   . Left knee DJD     RAMSEUR,[name], PTA 08/08/2019, 3:29 PM  [facility] [address] [place], [place], [address] Phone: [phone]   Fax:  [phone]  Name: Theresa Reese MRN: [id number] Date of Birth: 12-18-1950

## 2019-08-13 ENCOUNTER — Other Ambulatory Visit: Payer: Self-pay

## 2019-08-13 ENCOUNTER — Ambulatory Visit: Payer: Medicare PPO | Admitting: *Deleted

## 2019-08-13 DIAGNOSIS — M25512 Pain in left shoulder: Secondary | ICD-10-CM

## 2019-08-13 DIAGNOSIS — M25612 Stiffness of left shoulder, not elsewhere classified: Secondary | ICD-10-CM

## 2019-08-13 DIAGNOSIS — M6281 Muscle weakness (generalized): Secondary | ICD-10-CM | POA: Diagnosis not present

## 2019-08-13 NOTE — Therapy (Signed)
[facility] [place], [place], [address] Phone: [phone]   Fax:  [phone]  Physical Therapy Treatment  Patient Details  Name: Theresa Reese MRN: [id number] Date of Birth: 12-Jul-1950 Referring Provider (PT): Ophelia Charter   Encounter Date: 08/13/2019  PT End of Session - 08/13/19 1523    Visit Number  19    Number of Visits  24    Date for PT Re-Evaluation  08/30/19    Authorization Type  Dash score   43.9 03-28-19    PT Start Time  1430    PT Stop Time  1525    PT Time Calculation (min)  55 min       Past Medical History:  Diagnosis Date  . GERD (gastroesophageal reflux disease)   . Hypercholesteremia   . Hypertension   . Hypothyroid   . Insomnia   . Left knee DJD   . Left shoulder pain   . Lymphocytic colitis     Past Surgical History:  Procedure Laterality Date  . BACK SURGERY    . CESAREAN SECTION  1987  . CESAREAN SECTION  1990  . COLONOSCOPY    . EYE SURGERY Bilateral    cataract  . JOINT REPLACEMENT Left   . KNEE ARTHROSCOPY W/ MENISCECTOMY Left 2014   Dr Tonita Cong  . low back surgery  1997   Dr Rita Ohara  . low back surgery  1994   Dr Rolin Barry  . RETINAL DETACHMENT SURGERY Right 2013  . SHOULDER ARTHROSCOPY WITH ROTATOR CUFF REPAIR Left 05/22/2019   Procedure: LEFT SHOULDER ARTHROSCOPY WITH ROTATOR CUFF REPAIR;  Surgeon: Hiram Gash, MD;  Location: [place];  Service: Orthopedics;  Laterality: Left;  . SHOULDER ARTHROSCOPY WITH SUBACROMIAL DECOMPRESSION AND BICEP TENDON REPAIR Left 05/22/2019   Procedure: LEFT SHOULDER ARTHROSCOPY  DEBRIDEMENT WITH SUBACROMIAL DECOMPRESSION AND BICEP TENODESIS;  Surgeon: Hiram Gash, MD;  Location: [place];  Service: Orthopedics;  Laterality: Left;  . TOTAL KNEE ARTHROPLASTY Left 09/09/2013   Procedure: LEFT TOTAL KNEE ARTHROPLASTY;  Surgeon: Lorn Junes, MD;  Location: [place];  Service: Orthopedics;  Laterality: Left;    There  were no vitals filed for this visit.  Subjective Assessment - 08/13/19 1521    Subjective  COVID-19 screen performed prior to patient entering clinic.  LT shldr is very sore due to reaching for a falling loaf of bread.    Pertinent History  TKA, lumbar surgery, OA.    Patient Stated Goals  Be able to use my arm    Currently in Pain?  Yes    Pain Score  6     Pain Location  Shoulder    Pain Orientation  Left    Pain Descriptors / Indicators  Aching;Sore    Pain Type  Surgical pain    Pain Onset  More than a month ago                        [facility] Adult PT Treatment/Exercise - 08/13/19 0001      Exercises   Exercises  Shoulder      Shoulder Exercises: Pulleys   Flexion  5 minutes    Other Pulley Exercises  standing UE ranger x 6 mins flexion/ ext , CW, CCW, Standing flexion      Modalities   Modalities  Vasopneumatic;Electrical Stimulation      Electrical Stimulation   Electrical Stimulation Location  L shoulder  premod 80-150hz  x 15 mins    Electrical Stimulation Goals  Pain      Vasopneumatic   Number Minutes Vasopneumatic   15 minutes    Vasopnuematic Location   Shoulder    Vasopneumatic Pressure  Low    Vasopneumatic Temperature   34      Manual Therapy   Manual Therapy  Passive ROM    Passive ROM  PROM to LT shldr in supine for ER/IR  , isometrics for contract /relax stretcing to increase ER.  light ER manual resistance                  PT Long Term Goals - 07/04/19 1732      PT LONG TERM GOAL #1   Title  Patient will be independent with HEP    Time  8    Period  Weeks    Status  On-going      PT LONG TERM GOAL #2   Title  Perform basci ADL's with pain </= 3/10.    Baseline  unable to perform ADL's currently    Time  8    Period  Weeks    Status  On-going      PT LONG TERM GOAL #3   Title  Pt will improve her L shoulder flexion >/= 140 degrees.    Baseline  see flowsheets    Time  8    Period  Weeks    Status  On-going       PT LONG TERM GOAL #4   Title  Pt will improve her left ER to 40 degrees (limited by protocol).    Baseline  see slow sheets    Time  8    Period  Weeks    Status  On-going      PT LONG TERM GOAL #5   Title  pt will improve grip strength on L hand to >/= 50 ppsi.    Baseline  32 ppsi    Period  Weeks    Status  On-going            Plan - 08/13/19 1622    Clinical Impression Statement  Pt arrived today reporting having increased pain today in LT shldr due to reaching for a loaf of bread that was falling. Pt still able to perform all shldr motions actively, but with incrreased pain. Rx consisted with AAROM f/b PROM and some manual resisted IR and ER and was tolerated well. Normal modality tresponse today and was advised to ice her shldr again tonight and tomorrow.    Personal Factors and Comorbidities  Comorbidity 1;Comorbidity 2    Examination-Activity Limitations  Lift;Carry;Dressing;Caring for Others;Sleep    Examination-Participation Restrictions  Other    Stability/Clinical Decision Making  Stable/Uncomplicated    Rehab Potential  Good    PT Frequency  2x / week    PT Duration  8 weeks    PT Treatment/Interventions  ADLs/Self Care Home Management;Cryotherapy;Electrical Stimulation;Ultrasound;Moist Heat;Therapeutic activities;Therapeutic exercise;Manual techniques;Patient/family education;Passive range of motion;Dry needling;Vasopneumatic Device;Taping;Functional mobility training    PT Next Visit Plan  AAROM pulleys, seated and standing UE ranger. AROM all positions, PROM       Patient will benefit from skilled therapeutic intervention in order to improve the following deficits and impairments:  Pain, Postural dysfunction, Impaired UE functional use, Decreased activity tolerance, Decreased strength, Decreased range of motion, Increased edema  Visit Diagnosis: Muscle weakness (generalized)  Stiffness of left shoulder, not elsewhere classified  Chronic left  shoulder  pain     Problem List Patient Active Problem List   Diagnosis Date Noted  . DJD (degenerative joint disease) of knee 09/09/2013  . Insomnia   . Depression with anxiety   . Hypothyroid   . Lymphocytic colitis   . Left knee DJD     Theresa Reese,[name], PTA 08/13/2019, 4:26 PM  [facility] [address] [place], [place], [address] Phone: [phone]   Fax:  [phone]  Name: Theresa Reese MRN: [id number] Date of Birth: 16-Nov-1950

## 2019-08-15 ENCOUNTER — Ambulatory Visit: Payer: Medicare PPO | Admitting: *Deleted

## 2019-08-15 ENCOUNTER — Other Ambulatory Visit: Payer: Self-pay

## 2019-08-15 ENCOUNTER — Encounter: Payer: Self-pay | Admitting: *Deleted

## 2019-08-15 DIAGNOSIS — M25512 Pain in left shoulder: Secondary | ICD-10-CM

## 2019-08-15 DIAGNOSIS — M6281 Muscle weakness (generalized): Secondary | ICD-10-CM | POA: Diagnosis not present

## 2019-08-15 DIAGNOSIS — M25612 Stiffness of left shoulder, not elsewhere classified: Secondary | ICD-10-CM

## 2019-08-15 NOTE — Therapy (Signed)
[facility] [address] [place], [place], [address] Phone: [phone]   Fax:  [phone]  Physical Therapy Treatment  Patient Details  Name: Theresa Reese MRN: [id number] Date of Birth: 09/11/1950 Referring Provider (PT): Ramond Marrow   Encounter Date: 08/15/2019  PT End of Session - 08/15/19 1438    Visit Number  20    Number of Visits  24    Date for PT Re-Evaluation  08/30/19    Authorization Type  Dash score   43.9 03-28-19    PT Start Time  1430    PT Stop Time  1528    PT Time Calculation (min)  58 min       Past Medical History:  Diagnosis Date  . GERD (gastroesophageal reflux disease)   . Hypercholesteremia   . Hypertension   . Hypothyroid   . Insomnia   . Left knee DJD   . Left shoulder pain   . Lymphocytic colitis     Past Surgical History:  Procedure Laterality Date  . BACK SURGERY    . CESAREAN SECTION  1987  . CESAREAN SECTION  1990  . COLONOSCOPY    . EYE SURGERY Bilateral    cataract  . JOINT REPLACEMENT Left   . KNEE ARTHROSCOPY W/ MENISCECTOMY Left 2014   Dr Shelle Iron  . low back surgery  1997   Dr Jule Ser  . low back surgery  1994   Dr Elesa Hacker  . RETINAL DETACHMENT SURGERY Right 2013  . SHOULDER ARTHROSCOPY WITH ROTATOR CUFF REPAIR Left 05/22/2019   Procedure: LEFT SHOULDER ARTHROSCOPY WITH ROTATOR CUFF REPAIR;  Surgeon: Bjorn Pippin, MD;  Location: [facility];  Service: Orthopedics;  Laterality: Left;  . SHOULDER ARTHROSCOPY WITH SUBACROMIAL DECOMPRESSION AND BICEP TENDON REPAIR Left 05/22/2019   Procedure: LEFT SHOULDER ARTHROSCOPY  DEBRIDEMENT WITH SUBACROMIAL DECOMPRESSION AND BICEP TENODESIS;  Surgeon: Bjorn Pippin, MD;  Location: [facility];  Service: Orthopedics;  Laterality: Left;  . TOTAL KNEE ARTHROPLASTY Left 09/09/2013   Procedure: LEFT TOTAL KNEE ARTHROPLASTY;  Surgeon: Nilda Simmer, MD;  Location: [facility];  Service: Orthopedics;  Laterality: Left;    There  were no vitals filed for this visit.  Subjective Assessment - 08/15/19 1436    Subjective  COVID-19 screen performed prior to patient entering clinic.  LT shldr is very sore due to reaching for a falling loaf of bread.  5/10today    Patient Stated Goals  Be able to use my arm    Currently in Pain?  Yes    Pain Score  5     Pain Location  Shoulder    Pain Orientation  Left    Pain Descriptors / Indicators  Aching;Sore    Pain Type  Surgical pain    Pain Onset  More than a month ago                        [facility] Adult PT Treatment/Exercise - 08/15/19 0001      Exercises   Exercises  Shoulder      Shoulder Exercises: Standing   External Rotation  AROM;20 reps;10 reps;Left   manual resistance     Shoulder Exercises: Pulleys   Flexion  5 minutes    Other Pulley Exercises  standing UE ranger x 6 mins flexion/ ext , CW, CCW, Standing flexion      Modalities   Modalities  Vasopneumatic;Electrical Stimulation  Programme researcher, broadcasting/film/video Location  L shoulder  premod 80-150hz  x 15 mins    Electrical Stimulation Goals  Pain      Vasopneumatic   Number Minutes Vasopneumatic   15 minutes    Vasopnuematic Location   Shoulder    Vasopneumatic Pressure  Low    Vasopneumatic Temperature   34      Manual Therapy   Manual Therapy  Passive ROM;Soft tissue mobilization    Soft tissue mobilization  STW/TPR to LT pec major and sub scap    Passive ROM  PROM to LT shldr in supine for ER/IR  , isometrics for contract /relax stretcing to increase ER.  light ER manual resistance                  PT Long Term Goals - 07/04/19 1732      PT LONG TERM GOAL #1   Title  Patient will be independent with HEP    Time  8    Period  Weeks    Status  On-going      PT LONG TERM GOAL #2   Title  Perform basci ADL's with pain </= 3/10.    Baseline  unable to perform ADL's currently    Time  8    Period  Weeks    Status  On-going      PT LONG TERM  GOAL #3   Title  Pt will improve her L shoulder flexion >/= 140 degrees.    Baseline  see flowsheets    Time  8    Period  Weeks    Status  On-going      PT LONG TERM GOAL #4   Title  Pt will improve her left ER to 40 degrees (limited by protocol).    Baseline  see slow sheets    Time  8    Period  Weeks    Status  On-going      PT LONG TERM GOAL #5   Title  pt will improve grip strength on L hand to >/= 50 ppsi.    Baseline  32 ppsi    Period  Weeks    Status  On-going            Plan - 08/15/19 1538    Clinical Impression Statement  Pt arrived today doing fair, but still  with increased pain in LT shldr after trying to catch a loaf of bread falling from the top of the refrigerator. Most of her pain is anterior aspect near bicep origin. AAROM and PROM performed as well as TPR to pec major and sub scap. Her PROM today was flexion to 154 degrees and ER to 55 degrees.  MD Apt. next week.    Personal Factors and Comorbidities  Comorbidity 1;Comorbidity 2    Comorbidities  TKA, lumbar surgery, OA.    Examination-Activity Limitations  Lift;Carry;Dressing;Caring for Others;Sleep    Stability/Clinical Decision Making  Stable/Uncomplicated    PT Frequency  2x / week    PT Duration  8 weeks    PT Treatment/Interventions  ADLs/Self Care Home Management;Cryotherapy;Electrical Stimulation;Ultrasound;Moist Heat;Therapeutic activities;Therapeutic exercise;Manual techniques;Patient/family education;Passive range of motion;Dry needling;Vasopneumatic Device;Taping;Functional mobility training    PT Next Visit Plan  AAROM pulleys, seated and standing UE ranger. AROM all positions, PROM   To MD next week    PT Home Exercise Plan  pendulums, ball squeezes       Patient will benefit from skilled therapeutic intervention in  order to improve the following deficits and impairments:  Pain, Postural dysfunction, Impaired UE functional use, Decreased activity tolerance, Decreased strength, Decreased  range of motion, Increased edema  Visit Diagnosis: Muscle weakness (generalized)  Stiffness of left shoulder, not elsewhere classified  Chronic left shoulder pain     Problem List Patient Active Problem List   Diagnosis Date Noted  . DJD (degenerative joint disease) of knee 09/09/2013  . Insomnia   . Depression with anxiety   . Hypothyroid   . Lymphocytic colitis   . Left knee DJD     RAMSEUR,[name], PTA 08/15/2019, 4:03 PM  [facility] [address] [place], [place], [address] Phone: [phone]   Fax:  [phone]  Name: Theresa Reese MRN: [id number] Date of Birth: 07-10-1950

## 2019-08-20 ENCOUNTER — Encounter: Payer: Medicare PPO | Admitting: Physical Therapy

## 2019-08-22 ENCOUNTER — Other Ambulatory Visit (HOSPITAL_COMMUNITY)
Admission: RE | Admit: 2019-08-22 | Discharge: 2019-08-22 | Disposition: A | Discharge status: Home / Self Care | Payer: Medicare PPO | Source: Ambulatory Visit | Attending: Family Medicine | Admitting: Family Medicine

## 2019-08-22 ENCOUNTER — Other Ambulatory Visit: Payer: Self-pay

## 2019-08-22 ENCOUNTER — Other Ambulatory Visit: Payer: Self-pay | Admitting: Family Medicine

## 2019-08-22 ENCOUNTER — Ambulatory Visit: Payer: Medicare PPO | Attending: Orthopaedic Surgery | Admitting: Physical Therapy

## 2019-08-22 ENCOUNTER — Encounter: Payer: Self-pay | Admitting: Physical Therapy

## 2019-08-22 DIAGNOSIS — M25512 Pain in left shoulder: Secondary | ICD-10-CM | POA: Insufficient documentation

## 2019-08-22 DIAGNOSIS — M6281 Muscle weakness (generalized): Secondary | ICD-10-CM | POA: Diagnosis present

## 2019-08-22 DIAGNOSIS — G8929 Other chronic pain: Secondary | ICD-10-CM | POA: Diagnosis present

## 2019-08-22 DIAGNOSIS — M25612 Stiffness of left shoulder, not elsewhere classified: Secondary | ICD-10-CM | POA: Diagnosis present

## 2019-08-22 DIAGNOSIS — Z124 Encounter for screening for malignant neoplasm of cervix: Secondary | ICD-10-CM | POA: Diagnosis present

## 2019-08-22 DIAGNOSIS — Z1151 Encounter for screening for human papillomavirus (HPV): Secondary | ICD-10-CM | POA: Insufficient documentation

## 2019-08-22 NOTE — Therapy (Signed)
[facility] [address] [place], [place], [address] Phone: [phone]   Fax:  [phone]  Physical Therapy Treatment  Patient Details  Name: Theresa Reese MRN: [id number] Date of Birth: November 12, 1950 Referring Provider (PT): Ramond Marrow   Encounter Date: 08/22/2019  PT End of Session - 08/22/19 1440    Visit Number  21    Number of Visits  24    Date for PT Re-Evaluation  08/30/19    Authorization Type  Dash score   43.9 03-28-19    PT Start Time  1435    PT Stop Time  1520    PT Time Calculation (min)  45 min    Activity Tolerance  Patient tolerated treatment well    Behavior During Therapy  [facility] for tasks assessed/performed       Past Medical History:  Diagnosis Date  . GERD (gastroesophageal reflux disease)   . Hypercholesteremia   . Hypertension   . Hypothyroid   . Insomnia   . Left knee DJD   . Left shoulder pain   . Lymphocytic colitis     Past Surgical History:  Procedure Laterality Date  . BACK SURGERY    . CESAREAN SECTION  1987  . CESAREAN SECTION  1990  . COLONOSCOPY    . EYE SURGERY Bilateral    cataract  . JOINT REPLACEMENT Left   . KNEE ARTHROSCOPY W/ MENISCECTOMY Left 2014   Dr Shelle Iron  . low back surgery  1997   Dr Jule Ser  . low back surgery  1994   Dr Elesa Hacker  . RETINAL DETACHMENT SURGERY Right 2013  . SHOULDER ARTHROSCOPY WITH ROTATOR CUFF REPAIR Left 05/22/2019   Procedure: LEFT SHOULDER ARTHROSCOPY WITH ROTATOR CUFF REPAIR;  Surgeon: Bjorn Pippin, MD;  Location: [facility];  Service: Orthopedics;  Laterality: Left;  . SHOULDER ARTHROSCOPY WITH SUBACROMIAL DECOMPRESSION AND BICEP TENDON REPAIR Left 05/22/2019   Procedure: LEFT SHOULDER ARTHROSCOPY  DEBRIDEMENT WITH SUBACROMIAL DECOMPRESSION AND BICEP TENODESIS;  Surgeon: Bjorn Pippin, MD;  Location: [facility];  Service: Orthopedics;  Laterality: Left;  . TOTAL KNEE ARTHROPLASTY Left 09/09/2013   Procedure: LEFT TOTAL KNEE  ARTHROPLASTY;  Surgeon: Nilda Simmer, MD;  [facility];  Service: Orthopedics;  Laterality: Left;    There were no vitals filed for this visit.  Subjective Assessment - 08/22/19 1439    Subjective  COVID-19 screen performed prior to patient entering clinic. Reports feeling awesome and has not taken any pain meds today. MD lifted all restrictions and could use her judgement for lifting. Patient reports she was able to wash her hair by herself without leaning over for the first time.    Pertinent History  TKA, lumbar surgery, OA.    Patient Stated Goals  Be able to use my arm    Currently in Pain?  No/denies         [facility] PT Assessment - 08/22/19 0001      Assessment   Medical Diagnosis  s/p L shoulder debridement with subacrominal decompression, bicep tendodesis, rotator cuff repair    Referring Provider (PT)  Ramond Marrow    Onset Date/Surgical Date  05/22/19    Hand Dominance  Right    Next MD Visit  10/2019    Prior Therapy  yes, for shoulders and knee      Precautions   Precautions  Shoulder    Type of Shoulder Precautions  Dr. Austin Miles protocol under media  [facility] Adult PT Treatment/Exercise - 08/22/19 0001      Shoulder Exercises: Supine   Protraction  AROM;Left;20 reps    Flexion  AAROM;Left;10 reps      Shoulder Exercises: Pulleys   Flexion  5 minutes      Shoulder Exercises: ROM/Strengthening   Ranger  standing UE ranger into flexion x20 reps      Shoulder Exercises: Isometric Strengthening   Flexion  5X5"    Extension  5X5"    External Rotation  5X5"    Internal Rotation  5X5"      Modalities   Modalities  Electrical Stimulation;Vasopneumatic      Electrical Stimulation   Electrical Stimulation Location  L shoulder    Electrical Stimulation Action  Pre-Mod    Electrical Stimulation Parameters  80-150 hz x10 min    Electrical Stimulation Goals  Pain      Vasopneumatic   Number Minutes Vasopneumatic   10 minutes     Vasopnuematic Location   Shoulder    Vasopneumatic Pressure  Low    Vasopneumatic Temperature   34      Manual Therapy   Manual Therapy  Passive ROM    Passive ROM  PROM of L shoulder into flexion, ER, IR with gentle holds at end range                  PT Long Term Goals - 07/04/19 1732      PT LONG TERM GOAL #1   Title  Patient will be independent with HEP    Time  8    Period  Weeks    Status  On-going      PT LONG TERM GOAL #2   Title  Perform basci ADL's with pain </= 3/10.    Baseline  unable to perform ADL's currently    Time  8    Period  Weeks    Status  On-going      PT LONG TERM GOAL #3   Title  Pt will improve her L shoulder flexion >/= 140 degrees.    Baseline  see flowsheets    Time  8    Period  Weeks    Status  On-going      PT LONG TERM GOAL #4   Title  Pt will improve her left ER to 40 degrees (limited by protocol).    Baseline  see slow sheets    Time  8    Period  Weeks    Status  On-going      PT LONG TERM GOAL #5   Title  pt will improve grip strength on L hand to >/= 50 ppsi.    Baseline  32 ppsi    Period  Weeks    Status  On-going            Plan - 08/22/19 1617    Clinical Impression Statement  Patient progressed per protocol for more light strengthening and AROM exercises today. Patient limited by muscle fatigue after isometrics. Patient required increased multimodal cueing for proper technique training for isometrics. Firm end feels and smooth arc of motion noted during PROM of L shoulder in all directions. No complaints of pain during today's treatment. Normal modalities response noted following removal of the modalities.    Personal Factors and Comorbidities  Comorbidity 1;Comorbidity 2    Comorbidities  TKA, lumbar surgery, OA.    Examination-Activity Limitations  Lift;Carry;Dressing;Caring for Others;Sleep    Examination-Participation Restrictions  Other  Stability/Clinical Decision Making  Stable/Uncomplicated     Rehab Potential  Good    PT Frequency  2x / week    PT Duration  8 weeks    PT Treatment/Interventions  ADLs/Self Care Home Management;Cryotherapy;Electrical Stimulation;Ultrasound;Moist Heat;Therapeutic activities;Therapeutic exercise;Manual techniques;Patient/family education;Passive range of motion;Dry needling;Vasopneumatic Device;Taping;Functional mobility training    PT Next Visit Plan  Continue AROM, strengthening exercises per protocol.    PT Home Exercise Plan  pendulums, ball squeezes    Consulted and Agree with Plan of Care  Patient       Patient will benefit from skilled therapeutic intervention in order to improve the following deficits and impairments:  Pain, Postural dysfunction, Impaired UE functional use, Decreased activity tolerance, Decreased strength, Decreased range of motion, Increased edema  Visit Diagnosis: Muscle weakness (generalized)  Stiffness of left shoulder, not elsewhere classified  Chronic left shoulder pain     Problem List Patient Active Problem List   Diagnosis Date Noted  . DJD (degenerative joint disease) of knee 09/09/2013  . Insomnia   . Depression with anxiety   . Hypothyroid   . Lymphocytic colitis   . Left knee DJD     Marvell Fuller, PTA 08/22/2019, 4:27 PM  [facility] [address] [place], [place], [address] Phone: [phone]   Fax:  [phone]  Name: Theresa Reese MRN: [id number] Date of Birth: 03-Dec-1950

## 2019-08-26 ENCOUNTER — Other Ambulatory Visit: Payer: Self-pay

## 2019-08-26 ENCOUNTER — Ambulatory Visit: Payer: Medicare PPO | Admitting: *Deleted

## 2019-08-26 DIAGNOSIS — M6281 Muscle weakness (generalized): Secondary | ICD-10-CM | POA: Diagnosis not present

## 2019-08-26 DIAGNOSIS — M25612 Stiffness of left shoulder, not elsewhere classified: Secondary | ICD-10-CM

## 2019-08-26 DIAGNOSIS — M25512 Pain in left shoulder: Secondary | ICD-10-CM

## 2019-08-26 LAB — CYTOLOGY - PAP
Comment: NEGATIVE
Diagnosis: NEGATIVE
High risk HPV: NEGATIVE

## 2019-08-26 NOTE — Therapy (Signed)
[facility] [address] [place], [place], [address] Phone: [phone]   Fax:  [phone]  Physical Therapy Treatment  Patient Details  Name: Theresa Reese MRN: [id number] Date of Birth: [birth year] Referring Provider (PT): Ramond Marrow   Encounter Date: 08/26/2019  PT End of Session - 08/26/19 1429    Visit Number  22    Number of Visits  24    Date for PT Re-Evaluation  08/30/19    Authorization Type  Dash score   43.9 03-28-19    PT Start Time  1425    PT Stop Time  1520    PT Time Calculation (min)  55 min       Past Medical History:  Diagnosis Date  . GERD (gastroesophageal reflux disease)   . Hypercholesteremia   . Hypertension   . Hypothyroid   . Insomnia   . Left knee DJD   . Left shoulder pain   . Lymphocytic colitis     Past Surgical History:  Procedure Laterality Date  . BACK SURGERY    . CESAREAN SECTION  1987  . CESAREAN SECTION  1990  . COLONOSCOPY    . EYE SURGERY Bilateral    cataract  . JOINT REPLACEMENT Left   . KNEE ARTHROSCOPY W/ MENISCECTOMY Left 2014   Dr Shelle Iron  . low back surgery  1997   Dr Jule Ser  . low back surgery  1994   Dr Elesa Hacker  . RETINAL DETACHMENT SURGERY Right 2013  . SHOULDER ARTHROSCOPY WITH ROTATOR CUFF REPAIR Left 05/22/2019   Procedure: LEFT SHOULDER ARTHROSCOPY WITH ROTATOR CUFF REPAIR;  Surgeon: Bjorn Pippin, MD;  Location: [facility];  Service: Orthopedics;  Laterality: Left;  . SHOULDER ARTHROSCOPY WITH SUBACROMIAL DECOMPRESSION AND BICEP TENDON REPAIR Left 05/22/2019   Procedure: LEFT SHOULDER ARTHROSCOPY  DEBRIDEMENT WITH SUBACROMIAL DECOMPRESSION AND BICEP TENODESIS;  Surgeon: Bjorn Pippin, MD;  Location: [facility];  Service: Orthopedics;  Laterality: Left;  . TOTAL KNEE ARTHROPLASTY Left 09/09/2013   Procedure: LEFT TOTAL KNEE ARTHROPLASTY;  Surgeon: Nilda Simmer, MD;  [facility];  Service: Orthopedics;  Laterality: Left;    There  were no vitals filed for this visit.  Subjective Assessment - 08/26/19 1430    Subjective  COVID-19 screen performed prior to patient entering clinic. Reports  MD lifted all restrictions and could use her judgement for lifting.    Pertinent History  TKA, lumbar surgery, OA.    Patient Stated Goals  Be able to use my arm    Currently in Pain?  Yes    Pain Score  2     Pain Location  Shoulder    Pain Orientation  Left    Pain Descriptors / Indicators  Aching;Sore    Pain Type  Surgical pain    Pain Onset  More than a month ago                        [facility] Adult PT Treatment/Exercise - 08/26/19 0001      Exercises   Exercises  Shoulder      Shoulder Exercises: Supine   Flexion  --    Shoulder Flexion Weight (lbs)  --      Shoulder Exercises: Standing   Flexion  Left;20 reps;10 reps    Other Standing Exercises  bicep curls 2# 3x fatigue    Other Standing Exercises  RW 4 with added elbow extension 3x10  each with yellow tband.      Shoulder Exercises: Pulleys   Flexion  3 minutes      Shoulder Exercises: ROM/Strengthening   UBE (Upper Arm Bike)  6 mins      Modalities   Modalities  Electrical Stimulation;Vasopneumatic      Electrical Stimulation   Electrical Stimulation Location  L shoulder    Electrical Stimulation Action  premod    Electrical Stimulation Parameters  80-150hz  x 15 mins    Electrical Stimulation Goals  Pain      Vasopneumatic   Number Minutes Vasopneumatic   10 minutes    Vasopnuematic Location   Shoulder    Vasopneumatic Pressure  Low    Vasopneumatic Temperature   34      Manual Therapy   Manual Therapy  Passive ROM    Passive ROM  PROM of L shoulder ER,with end-range holds                  PT Long Term Goals - 07/04/19 1732      PT LONG TERM GOAL #1   Title  Patient will be independent with HEP    Time  8    Period  Weeks    Status  On-going      PT LONG TERM GOAL #2   Title  Perform basci ADL's with pain </=  3/10.    Baseline  unable to perform ADL's currently    Time  8    Period  Weeks    Status  On-going      PT LONG TERM GOAL #3   Title  Pt will improve her L shoulder flexion >/= 140 degrees.    Baseline  see flowsheets    Time  8    Period  Weeks    Status  On-going      PT LONG TERM GOAL #4   Title  Pt will improve her left ER to 40 degrees (limited by protocol).    Baseline  see slow sheets    Time  8    Period  Weeks    Status  On-going      PT LONG TERM GOAL #5   Title  pt will improve grip strength on L hand to >/= 50 ppsi.    Baseline  32 ppsi    Period  Weeks    Status  On-going            Plan - 08/26/19 1546    Clinical Impression Statement  Pt arrived today doing fairly well with low pain levels LT shldr. She was able to perform strengthening exs with tband and wts today and did well. Pts' endurance with elevation is still low and recomended that she remain with AROM 3x fatigue 2-3 x daily without shldr hiking. Normal modality rresponse.    Personal Factors and Comorbidities  Comorbidity 1;Comorbidity 2    Comorbidities  TKA, lumbar surgery, OA.    Examination-Activity Limitations  Lift;Carry;Dressing;Caring for Others;Sleep    Stability/Clinical Decision Making  Stable/Uncomplicated    Rehab Potential  Good    PT Frequency  2x / week    PT Duration  8 weeks    PT Treatment/Interventions  ADLs/Self Care Home Management;Cryotherapy;Electrical Stimulation;Ultrasound;Moist Heat;Therapeutic activities;Therapeutic exercise;Manual techniques;Patient/family education;Passive range of motion;Dry needling;Vasopneumatic Device;Taping;Functional mobility training    PT Next Visit Plan  Continue AROM, strengthening exercises per protocol.       Patient will benefit from skilled therapeutic intervention in order to  improve the following deficits and impairments:  Pain, Postural dysfunction, Impaired UE functional use, Decreased activity tolerance, Decreased strength,  Decreased range of motion, Increased edema  Visit Diagnosis: Muscle weakness (generalized)  Stiffness of left shoulder, not elsewhere classified  Chronic left shoulder pain     Problem List Patient Active Problem List   Diagnosis Date Noted  . DJD (degenerative joint disease) of knee 09/09/2013  . Insomnia   . Depression with anxiety   . Hypothyroid   . Lymphocytic colitis   . Left knee DJD     Kelwin Gibler,[name], PTA 08/26/2019, 3:50 PM  [facility] [address] [place], [place], [address] Phone: [phone]   Fax:  [phone]  Name: Theresa Reese MRN: [id number] Date of Birth: 1951/03/01

## 2019-08-29 ENCOUNTER — Other Ambulatory Visit: Payer: Self-pay

## 2019-08-29 ENCOUNTER — Ambulatory Visit: Payer: Medicare PPO | Admitting: *Deleted

## 2019-08-29 DIAGNOSIS — G8929 Other chronic pain: Secondary | ICD-10-CM

## 2019-08-29 DIAGNOSIS — M6281 Muscle weakness (generalized): Secondary | ICD-10-CM | POA: Diagnosis not present

## 2019-08-29 DIAGNOSIS — M25612 Stiffness of left shoulder, not elsewhere classified: Secondary | ICD-10-CM

## 2019-09-02 ENCOUNTER — Ambulatory Visit: Payer: Medicare PPO | Admitting: *Deleted

## 2019-09-02 ENCOUNTER — Other Ambulatory Visit: Payer: Self-pay

## 2019-09-02 DIAGNOSIS — G8929 Other chronic pain: Secondary | ICD-10-CM

## 2019-09-02 DIAGNOSIS — M6281 Muscle weakness (generalized): Secondary | ICD-10-CM | POA: Diagnosis not present

## 2019-09-02 DIAGNOSIS — M25612 Stiffness of left shoulder, not elsewhere classified: Secondary | ICD-10-CM

## 2019-09-02 NOTE — Therapy (Signed)
[facility] [place], [place], [address] Phone: [phone]   Fax:  [phone]  Physical Therapy Treatment  Patient Details  Name: Theresa Reese MRN: [id number] Date of Birth: 03/02/1951 Referring Provider (PT): Ophelia Charter   Encounter Date: 08/29/2019    Past Medical History:  Diagnosis Date  . GERD (gastroesophageal reflux disease)   . Hypercholesteremia   . Hypertension   . Hypothyroid   . Insomnia   . Left knee DJD   . Left shoulder pain   . Lymphocytic colitis     Past Surgical History:  Procedure Laterality Date  . BACK SURGERY    . CESAREAN SECTION  1987  . CESAREAN SECTION  1990  . COLONOSCOPY    . EYE SURGERY Bilateral    cataract  . JOINT REPLACEMENT Left   . KNEE ARTHROSCOPY W/ MENISCECTOMY Left 2014   Dr Tonita Cong  . low back surgery  1997   Dr Rita Ohara  . low back surgery  1994   Dr Rolin Barry  . RETINAL DETACHMENT SURGERY Right 2013  . SHOULDER ARTHROSCOPY WITH ROTATOR CUFF REPAIR Left 05/22/2019   Procedure: LEFT SHOULDER ARTHROSCOPY WITH ROTATOR CUFF REPAIR;  Surgeon: Hiram Gash, MD;  Location: [facility];  Service: Orthopedics;  Laterality: Left;  . SHOULDER ARTHROSCOPY WITH SUBACROMIAL DECOMPRESSION AND BICEP TENDON REPAIR Left 05/22/2019   Procedure: LEFT SHOULDER ARTHROSCOPY  DEBRIDEMENT WITH SUBACROMIAL DECOMPRESSION AND BICEP TENODESIS;  Surgeon: Hiram Gash, MD;  Location: [place] [facility];  Service: Orthopedics;  Laterality: Left;  . TOTAL KNEE ARTHROPLASTY Left 09/09/2013   Procedure: LEFT TOTAL KNEE ARTHROPLASTY;  Surgeon: Lorn Junes, MD;  Location: [place];  Service: Orthopedics;  Laterality: Left;    There were no vitals filed for this visit.              see flow sheet for Rx                     PT Long Term Goals - 07/04/19 1732      PT LONG TERM GOAL #1   Title Patient will be independent with HEP    Time 8    Period Weeks     Status On-going      PT LONG TERM GOAL #2   Title Perform basci ADL's with pain </= 3/10.    Baseline unable to perform ADL's currently    Time 8    Period Weeks    Status On-going      PT LONG TERM GOAL #3   Title Pt will improve her L shoulder flexion >/= 140 degrees.    Baseline see flowsheets    Time 8    Period Weeks    Status On-going      PT LONG TERM GOAL #4   Title Pt will improve her left ER to 40 degrees (limited by protocol).    Baseline see slow sheets    Time 8    Period Weeks    Status On-going      PT LONG TERM GOAL #5   Title pt will improve grip strength on L hand to >/= 50 ppsi.    Baseline 32 ppsi    Period Weeks    Status On-going                 Plan - 09/02/19 1444    Clinical Impression Statement Pt tolerated Rx well with focus on ROM and  strengthening    Stability/Clinical Decision Making Stable/Uncomplicated    Rehab Potential Good    PT Frequency 2x / week    PT Duration 8 weeks    PT Treatment/Interventions ADLs/Self Care Home Management;Cryotherapy;Electrical Stimulation;Ultrasound;Moist Heat;Therapeutic activities;Therapeutic exercise;Manual techniques;Patient/family education;Passive range of motion;Dry needling;Vasopneumatic Device;Taping;Functional mobility training    PT Next Visit Plan Continue AROM, strengthening exercises per protocol.    Consulted and Agree with Plan of Care Patient           Patient will benefit from skilled therapeutic intervention in order to improve the following deficits and impairments:  Pain, Postural dysfunction, Impaired UE functional use, Decreased activity tolerance, Decreased strength, Decreased range of motion, Increased edema  Visit Diagnosis: Muscle weakness (generalized)  Stiffness of left shoulder, not elsewhere classified  Chronic left shoulder pain     Problem List Patient Active Problem List   Diagnosis Date Noted  . DJD (degenerative joint disease) of knee 09/09/2013  .  Insomnia   . Depression with anxiety   . Hypothyroid   . Lymphocytic colitis   . Left knee DJD     Theresa Reese,[name] 09/02/2019, 2:48 PM  [facility] [address] [place], [place], [address] Phone: [phone]   Fax:  [phone]  Name: Theresa Reese MRN: [id number] Date of Birth: Jun 06, 1950

## 2019-09-02 NOTE — Therapy (Signed)
[facility] [place], [place], [address] Phone: [phone]   Fax:  [phone]  Physical Therapy Treatment  Patient Details  Name: Theresa Reese MRN: [id number] Date of Birth: [birth year] Referring Provider (PT): Ophelia Charter   Encounter Date: 09/02/2019   PT End of Session - 09/02/19 6962    Visit Number 24    Number of Visits 27    Date for PT Re-Evaluation 09/27/19    Authorization Type Dash score   43.9 03-28-19    PT Start Time 1430    PT Stop Time 1525    PT Time Calculation (min) 55 min           Past Medical History:  Diagnosis Date  . GERD (gastroesophageal reflux disease)   . Hypercholesteremia   . Hypertension   . Hypothyroid   . Insomnia   . Left knee DJD   . Left shoulder pain   . Lymphocytic colitis     Past Surgical History:  Procedure Laterality Date  . BACK SURGERY    . CESAREAN SECTION  1987  . CESAREAN SECTION  1990  . COLONOSCOPY    . EYE SURGERY Bilateral    cataract  . JOINT REPLACEMENT Left   . KNEE ARTHROSCOPY W/ MENISCECTOMY Left 2014   Dr Tonita Cong  . low back surgery  1997   Dr Rita Ohara  . low back surgery  1994   Dr Rolin Barry  . RETINAL DETACHMENT SURGERY Right 2013  . SHOULDER ARTHROSCOPY WITH ROTATOR CUFF REPAIR Left 05/22/2019   Procedure: LEFT SHOULDER ARTHROSCOPY WITH ROTATOR CUFF REPAIR;  Surgeon: Hiram Gash, MD;  Location: [place];  Service: Orthopedics;  Laterality: Left;  . SHOULDER ARTHROSCOPY WITH SUBACROMIAL DECOMPRESSION AND BICEP TENDON REPAIR Left 05/22/2019   Procedure: LEFT SHOULDER ARTHROSCOPY  DEBRIDEMENT WITH SUBACROMIAL DECOMPRESSION AND BICEP TENODESIS;  Surgeon: Hiram Gash, MD;  Location: [place];  Service: Orthopedics;  Laterality: Left;  . TOTAL KNEE ARTHROPLASTY Left 09/09/2013   Procedure: LEFT TOTAL KNEE ARTHROPLASTY;  Surgeon: Lorn Junes, MD;  Location: [place];  Service: Orthopedics;  Laterality: Left;    There  were no vitals filed for this visit.   Subjective Assessment - 09/02/19 1435    Subjective COVID-19 screen performed prior to patient entering clinic. Reports Sore after last Rx    Pertinent History TKA, lumbar surgery, OA.    Currently in Pain? Yes    Pain Score 4     Pain Location Shoulder    Pain Onset More than a month ago                                          PT Long Term Goals - 07/04/19 1732      PT LONG TERM GOAL #1   Title Patient will be independent with HEP    Time 8    Period Weeks    Status On-going      PT LONG TERM GOAL #2   Title Perform basci ADL's with pain </= 3/10.    Baseline unable to perform ADL's currently    Time 8    Period Weeks    Status On-going      PT LONG TERM GOAL #3   Title Pt will improve her L shoulder flexion >/= 140 degrees.    Baseline see flowsheets  Time 8    Period Weeks    Status On-going      PT LONG TERM GOAL #4   Title Pt will improve her left ER to 40 degrees (limited by protocol).    Baseline see slow sheets    Time 8    Period Weeks    Status On-going      PT LONG TERM GOAL #5   Title pt will improve grip strength on L hand to >/= 50 ppsi.    Baseline 32 ppsi    Period Weeks    Status On-going                 Plan - 09/02/19 1650    Clinical Impression Statement Pt arrived today doing fairly, but sor. Rx focused on ROM and strengthening for LT UE. She was able to progress with resistance in all motions except standing elevation due to weakness. Normal modality response end of session.    Personal Factors and Comorbidities Comorbidity 1;Comorbidity 2    Comorbidities TKA, lumbar surgery, OA.    Stability/Clinical Decision Making Stable/Uncomplicated    Rehab Potential Good    PT Frequency 2x / week    PT Treatment/Interventions ADLs/Self Care Home Management;Cryotherapy;Electrical Stimulation;Ultrasound;Moist Heat;Therapeutic activities;Therapeutic exercise;Manual  techniques;Patient/family education;Passive range of motion;Dry needling;Vasopneumatic Device;Taping;Functional mobility training    PT Next Visit Plan Continue AROM, strengthening exercises per protocol.   Recert for 3 visits to finalize HEP progression           Patient will benefit from skilled therapeutic intervention in order to improve the following deficits and impairments:  Pain, Postural dysfunction, Impaired UE functional use, Decreased activity tolerance, Decreased strength, Decreased range of motion, Increased edema  Visit Diagnosis: Muscle weakness (generalized) - Plan: PT plan of care cert/re-cert  Stiffness of left shoulder, not elsewhere classified - Plan: PT plan of care cert/re-cert  Chronic left shoulder pain - Plan: PT plan of care cert/re-cert     Problem List Patient Active Problem List   Diagnosis Date Noted  . DJD (degenerative joint disease) of knee 09/09/2013  . Insomnia   . Depression with anxiety   . Hypothyroid   . Lymphocytic colitis   . Left knee DJD    Seen by:  Thayer Ohm Rhys Lichty PTA 09/02/19 [place] MPT [facility] [address] [place], [place], [address] Phone: [phone]   Fax:  [phone]  Name: Theresa Reese MRN: [id number] Date of Birth: [birth year]

## 2019-09-05 ENCOUNTER — Encounter: Payer: Medicare PPO | Admitting: *Deleted

## 2019-09-09 ENCOUNTER — Ambulatory Visit: Payer: Medicare PPO | Admitting: *Deleted

## 2019-09-09 ENCOUNTER — Other Ambulatory Visit: Payer: Self-pay

## 2019-09-09 DIAGNOSIS — M25612 Stiffness of left shoulder, not elsewhere classified: Secondary | ICD-10-CM

## 2019-09-09 DIAGNOSIS — M6281 Muscle weakness (generalized): Secondary | ICD-10-CM | POA: Diagnosis not present

## 2019-09-09 DIAGNOSIS — M25512 Pain in left shoulder: Secondary | ICD-10-CM

## 2019-09-09 NOTE — Therapy (Signed)
[facility] [address] [place], [place], [address] Phone: [phone]   Fax:  [phone]  Physical Therapy Treatment  Patient Details  Name: Theresa Reese MRN: [id number] Date of Birth: [birth year] Referring Provider (PT): Ramond Marrow   Encounter Date: 09/09/2019   PT End of Session - 09/09/19 1426    Visit Number 25    Number of Visits 27    Date for PT Re-Evaluation 09/27/19    Authorization Type Dash score   43.9 03-28-19    PT Start Time 1414    PT Stop Time 1513    PT Time Calculation (min) 59 min           Past Medical History:  Diagnosis Date  . GERD (gastroesophageal reflux disease)   . Hypercholesteremia   . Hypertension   . Hypothyroid   . Insomnia   . Left knee DJD   . Left shoulder pain   . Lymphocytic colitis     Past Surgical History:  Procedure Laterality Date  . BACK SURGERY    . CESAREAN SECTION  1987  . CESAREAN SECTION  1990  . COLONOSCOPY    . EYE SURGERY Bilateral    cataract  . JOINT REPLACEMENT Left   . KNEE ARTHROSCOPY W/ MENISCECTOMY Left 2014   Dr Shelle Iron  . low back surgery  1997   Dr Jule Ser  . low back surgery  1994   Dr Elesa Hacker  . RETINAL DETACHMENT SURGERY Right 2013  . SHOULDER ARTHROSCOPY WITH ROTATOR CUFF REPAIR Left 05/22/2019   Procedure: LEFT SHOULDER ARTHROSCOPY WITH ROTATOR CUFF REPAIR;  Surgeon: Bjorn Pippin, MD;  Location: [facility];  Service: Orthopedics;  Laterality: Left;  . SHOULDER ARTHROSCOPY WITH SUBACROMIAL DECOMPRESSION AND BICEP TENDON REPAIR Left 05/22/2019   Procedure: LEFT SHOULDER ARTHROSCOPY  DEBRIDEMENT WITH SUBACROMIAL DECOMPRESSION AND BICEP TENODESIS;  Surgeon: Bjorn Pippin, MD;  Location: [facility];  Service: Orthopedics;  Laterality: Left;  . TOTAL KNEE ARTHROPLASTY Left 09/09/2013   Procedure: LEFT TOTAL KNEE ARTHROPLASTY;  Surgeon: Nilda Simmer, MD;  [facility];  Service: Orthopedics;  Laterality: Left;    There  were no vitals filed for this visit.   Subjective Assessment - 09/09/19 1423    Subjective COVID-19 screen performed prior to patient entering clinic. Reports Sore after last Rx, but using LT UE more    Pertinent History TKA, lumbar surgery, OA.    Patient Stated Goals Be able to use my arm    Currently in Pain? Yes    Pain Score 2     Pain Location Shoulder    Pain Orientation Left    Pain Descriptors / Indicators Aching    Pain Type Surgical pain    Pain Onset More than a month ago    Pain Frequency Intermittent                             [facility] Adult PT Treatment/Exercise - 09/09/19 0001      Exercises   Exercises Shoulder      Shoulder Exercises: Prone   Retraction Left;10 reps;20 reps    Retraction Weight (lbs) 3    Extension Strengthening;Left;20 reps;10 reps    Extension Weight (lbs) 3      Shoulder Exercises: Sidelying   External Rotation Strengthening;Left;10 reps;20 reps    External Rotation Weight (lbs) 1      Shoulder Exercises: Standing  Flexion Left;20 reps;10 reps    Shoulder Flexion Weight (lbs) 1    Other Standing Exercises cones in/out shldr ht  and  head ht  1# wt x 10 shldr ht shelf.    Other Standing Exercises wall pushups 3x5      Shoulder Exercises: Pulleys   Flexion 5 minutes      Shoulder Exercises: ROM/Strengthening   UBE (Upper Arm Bike) 8 mins RPMs 90 RPS      Modalities   Modalities Electrical Stimulation;Vasopneumatic      Electrical Stimulation   Electrical Stimulation Location L shoulder    Electrical Stimulation Action premod    Electrical Stimulation Parameters 80-150hz     Electrical Stimulation Goals Pain      Vasopneumatic   Number Minutes Vasopneumatic  15 minutes    Vasopnuematic Location  Shoulder    Vasopneumatic Pressure Low    Vasopneumatic Temperature  34                       PT Long Term Goals - 07/04/19 1732      PT LONG TERM GOAL #1   Title Patient will be independent with  HEP    Time 8    Period Weeks    Status On-going      PT LONG TERM GOAL #2   Title Perform basci ADL's with pain </= 3/10.    Baseline unable to perform ADL's currently    Time 8    Period Weeks    Status On-going      PT LONG TERM GOAL #3   Title Pt will improve her L shoulder flexion >/= 140 degrees.    Baseline see flowsheets    Time 8    Period Weeks    Status On-going      PT LONG TERM GOAL #4   Title Pt will improve her left ER to 40 degrees (limited by protocol).    Baseline see slow sheets    Time 8    Period Weeks    Status On-going      PT LONG TERM GOAL #5   Title pt will improve grip strength on L hand to >/= 50 ppsi.    Baseline 32 ppsi    Period Weeks    Status On-going                 Plan - 09/09/19 1426    Clinical Impression Statement Pt arrived today doing fairly well mainly with soreness. She was able to perform stretching and strengthening exs with progression and did well with mainly fatigue. Normal modality response today.    Personal Factors and Comorbidities Comorbidity 1;Comorbidity 2    Comorbidities TKA, lumbar surgery, OA.    Examination-Activity Limitations Lift;Carry;Dressing;Caring for Others;Sleep    Stability/Clinical Decision Making Stable/Uncomplicated    Rehab Potential Good    PT Frequency 2x / week    PT Duration 8 weeks    PT Treatment/Interventions ADLs/Self Care Home Management;Cryotherapy;Electrical Stimulation;Ultrasound;Moist Heat;Therapeutic activities;Therapeutic exercise;Manual techniques;Patient/family education;Passive range of motion;Dry needling;Vasopneumatic Device;Taping;Functional mobility training    PT Next Visit Plan Finalize HEP and DC    Consulted and Agree with Plan of Care Patient           Patient will benefit from skilled therapeutic intervention in order to improve the following deficits and impairments:  Pain, Postural dysfunction, Impaired UE functional use, Decreased activity tolerance,  Decreased strength, Decreased range of motion, Increased edema  Visit Diagnosis: Muscle  weakness (generalized)  Stiffness of left shoulder, not elsewhere classified  Chronic left shoulder pain     Problem List Patient Active Problem List   Diagnosis Date Noted  . DJD (degenerative joint disease) of knee 09/09/2013  . Insomnia   . Depression with anxiety   . Hypothyroid   . Lymphocytic colitis   . Left knee DJD     Theresa Reese,Theresa Reese , PTA 09/09/2019, 3:22 PM  [facility] [address] [place], [place], [address] Phone: [phone]   Fax:  [phone]  Name: Theresa Reese MRN: [id number] Date of Birth: 1950-06-27

## 2019-09-16 ENCOUNTER — Ambulatory Visit: Payer: Medicare PPO | Admitting: *Deleted

## 2019-09-16 ENCOUNTER — Other Ambulatory Visit: Payer: Self-pay

## 2019-09-16 DIAGNOSIS — M25512 Pain in left shoulder: Secondary | ICD-10-CM

## 2019-09-16 DIAGNOSIS — M6281 Muscle weakness (generalized): Secondary | ICD-10-CM | POA: Diagnosis not present

## 2019-09-16 DIAGNOSIS — M25612 Stiffness of left shoulder, not elsewhere classified: Secondary | ICD-10-CM

## 2019-09-16 NOTE — Therapy (Addendum)
[facility] [place], [place], [address] Phone: [phone]   Fax:  [phone]  Physical Therapy Treatment Discharge  Patient Details  Name: MODESTINE SCHERZINGER MRN: [id number] Date of Birth: [birth year] Referring Provider (PT): Ophelia Charter   Encounter Date: 09/16/2019   PT End of Session - 09/16/19 1458    Visit Number 26    Number of Visits 27    Date for PT Re-Evaluation 09/27/19    Authorization Type Dash score   43.9 03-28-19    PT Start Time 1430    PT Stop Time 1530    PT Time Calculation (min) 60 min           Past Medical History:  Diagnosis Date  . GERD (gastroesophageal reflux disease)   . Hypercholesteremia   . Hypertension   . Hypothyroid   . Insomnia   . Left knee DJD   . Left shoulder pain   . Lymphocytic colitis     Past Surgical History:  Procedure Laterality Date  . BACK SURGERY    . CESAREAN SECTION  1987  . CESAREAN SECTION  1990  . COLONOSCOPY    . EYE SURGERY Bilateral    cataract  . JOINT REPLACEMENT Left   . KNEE ARTHROSCOPY W/ MENISCECTOMY Left 2014   Dr Tonita Cong  . low back surgery  1997   Dr Rita Ohara  . low back surgery  1994   Dr Rolin Barry  . RETINAL DETACHMENT SURGERY Right 2013  . SHOULDER ARTHROSCOPY WITH ROTATOR CUFF REPAIR Left 05/22/2019   Procedure: LEFT SHOULDER ARTHROSCOPY WITH ROTATOR CUFF REPAIR;  Surgeon: Hiram Gash, MD;  Location: [place];  Service: Orthopedics;  Laterality: Left;  . SHOULDER ARTHROSCOPY WITH SUBACROMIAL DECOMPRESSION AND BICEP TENDON REPAIR Left 05/22/2019   Procedure: LEFT SHOULDER ARTHROSCOPY  DEBRIDEMENT WITH SUBACROMIAL DECOMPRESSION AND BICEP TENODESIS;  Surgeon: Hiram Gash, MD;  Location: [place];  Service: Orthopedics;  Laterality: Left;  . TOTAL KNEE ARTHROPLASTY Left 09/09/2013   Procedure: LEFT TOTAL KNEE ARTHROPLASTY;  Surgeon: Lorn Junes, MD;  Location: [place];  Service: Orthopedics;  Laterality: Left;     There were no vitals filed for this visit.   Subjective Assessment - 09/16/19 1438    Subjective COVID-19 screen performed prior to patient entering clinic. DC after today    Pertinent History TKA, lumbar surgery, OA.    Patient Stated Goals Be able to use my arm    Currently in Pain? Yes    Pain Score 2     Pain Location Shoulder    Pain Orientation Left    Pain Descriptors / Indicators Aching    Pain Type Surgical pain                             [facility] Adult PT Treatment/Exercise - 09/16/19 0001      Exercises   Exercises Shoulder      Shoulder Exercises: Prone   Retraction Left;10 reps;20 reps    Retraction Weight (lbs) 3    Extension Strengthening;Left;20 reps;10 reps    Extension Weight (lbs) 3      Shoulder Exercises: Sidelying   External Rotation Strengthening;Left;10 reps;20 reps    External Rotation Weight (lbs) 1      Shoulder Exercises: Standing   Flexion Left;20 reps;10 reps    Shoulder Flexion Weight (lbs) 2    Other Standing Exercises bicep curls 3#  3x fatigue    Other Standing Exercises wall pushups 3x5      Shoulder Exercises: Pulleys   Flexion 5 minutes      Shoulder Exercises: ROM/Strengthening   UBE (Upper Arm Bike) 8 mins RPMs 90 RPS      Modalities   Modalities Electrical Stimulation;Vasopneumatic      Electrical Stimulation   Electrical Stimulation Location L shoulder    Electrical Stimulation Action IFC    Electrical Stimulation Parameters 80-150hz x 15 mins    Electrical Stimulation Goals Pain      Vasopneumatic   Number Minutes Vasopneumatic  15 minutes    Vasopnuematic Location  Shoulder    Vasopneumatic Pressure Low    Vasopneumatic Temperature  34                       PT Long Term Goals - 09/16/19 1511      PT LONG TERM GOAL #1   Title Patient will be independent with HEP    Time 8    Period Weeks    Status Achieved      PT LONG TERM GOAL #2   Title Perform basci ADL's with pain </=  3/10.    Baseline unable to perform ADL's currently    Time 8    Period Weeks    Status Achieved      PT LONG TERM GOAL #3   Title Pt will improve her L shoulder flexion >/= 140 degrees.    Baseline see flowsheets    Time 8    Period Weeks    Status Achieved      PT LONG TERM GOAL #4   Title Pt will improve her left ER to 40 degrees (limited by protocol).    Baseline see slow sheets    Status Achieved      PT LONG TERM GOAL #5   Title pt will improve grip strength on L hand to >/= 50 ppsi.    Baseline 32 ppsi    Time 8    Period Weeks    Status Not Met   NM 42#s                Plan - 09/16/19 1445    Clinical Impression Statement Pt arrived today doing fairly well with RT shldr pain 2/10. She was progressed through resistance exs for HEP and did well. All LTGs  were met except LT grip strength. Normal modality response today with decreased soreness after Rx.  DC to HEP    Personal Factors and Comorbidities Comorbidity 1;Comorbidity 2    Examination-Activity Limitations Lift;Carry;Dressing;Caring for Others;Sleep    Examination-Participation Restrictions Other    Stability/Clinical Decision Making Stable/Uncomplicated    Rehab Potential Good    PT Frequency 2x / week    PT Duration 8 weeks    PT Treatment/Interventions ADLs/Self Care Home Management;Cryotherapy;Electrical Stimulation;Ultrasound;Moist Heat;Therapeutic activities;Therapeutic exercise;Manual techniques;Patient/family education;Passive range of motion;Dry needling;Vasopneumatic Device;Taping;Functional mobility training    PT Next Visit Plan DC    Consulted and Agree with Plan of Care Patient           Patient will benefit from skilled therapeutic intervention in order to improve the following deficits and impairments:  Pain, Postural dysfunction, Impaired UE functional use, Decreased activity tolerance, Decreased strength, Decreased range of motion, Increased edema  Visit Diagnosis: Muscle weakness  (generalized)  Stiffness of left shoulder, not elsewhere classified  Chronic left shoulder pain  PHYSICAL THERAPY DISCHARGE SUMMARY  Visits  from Start of Care: 26  Current functional level related to goals / functional outcomes: See above  Remaining deficits: Still progressing toward improved grip strength (goal #5)   Education / Equipment: HEP Plan: Patient agrees to discharge.  Patient goals were partially met. Patient is being discharged due to being pleased with the current functional level.  ?????        Problem List Patient Active Problem List   Diagnosis Date Noted  . DJD (degenerative joint disease) of knee 09/09/2013  . Insomnia   . Depression with anxiety   . Hypothyroid   . Lymphocytic colitis   . Left knee DJD     Saadiq Poche,[name], PTA 09/16/2019, 3:49 PM  Kearney Hard, PT, MPT 09/16/19 4:06 PM     [facility] [place], [place], [address] Phone: [phone]   Fax:  [phone]  Name: TAMMIE YANDA MRN: [id number] Date of Birth: January 20, 1951

## 2019-09-16 NOTE — Therapy (Signed)
[facility] [place], [place], [address] Phone: [phone]   Fax:  [phone]  Physical Therapy Treatment  Patient Details  Name: Theresa Reese MRN: [id number] Date of Birth: Oct 14, 1950 Referring Provider (PT): Ophelia Charter   Encounter Date: 09/16/2019   PT End of Session - 09/16/19 1458    Visit Number 26    Number of Visits 27    Date for PT Re-Evaluation 09/27/19    Authorization Type Dash score   43.9 03-28-19    PT Start Time 1430    PT Stop Time 1530    PT Time Calculation (min) 60 min           Past Medical History:  Diagnosis Date  . GERD (gastroesophageal reflux disease)   . Hypercholesteremia   . Hypertension   . Hypothyroid   . Insomnia   . Left knee DJD   . Left shoulder pain   . Lymphocytic colitis     Past Surgical History:  Procedure Laterality Date  . BACK SURGERY    . CESAREAN SECTION  1987  . CESAREAN SECTION  1990  . COLONOSCOPY    . EYE SURGERY Bilateral    cataract  . JOINT REPLACEMENT Left   . KNEE ARTHROSCOPY W/ MENISCECTOMY Left 2014   Dr Tonita Cong  . low back surgery  1997   Dr Rita Ohara  . low back surgery  1994   Dr Rolin Barry  . RETINAL DETACHMENT SURGERY Right 2013  . SHOULDER ARTHROSCOPY WITH ROTATOR CUFF REPAIR Left 05/22/2019   Procedure: LEFT SHOULDER ARTHROSCOPY WITH ROTATOR CUFF REPAIR;  Surgeon: Hiram Gash, MD;  Location: [place];  Service: Orthopedics;  Laterality: Left;  . SHOULDER ARTHROSCOPY WITH SUBACROMIAL DECOMPRESSION AND BICEP TENDON REPAIR Left 05/22/2019   Procedure: LEFT SHOULDER ARTHROSCOPY  DEBRIDEMENT WITH SUBACROMIAL DECOMPRESSION AND BICEP TENODESIS;  Surgeon: Hiram Gash, MD;  Location: [place];  Service: Orthopedics;  Laterality: Left;  . TOTAL KNEE ARTHROPLASTY Left 09/09/2013   Procedure: LEFT TOTAL KNEE ARTHROPLASTY;  Surgeon: Lorn Junes, MD;  Location: [place];  Service: Orthopedics;  Laterality: Left;    There  were no vitals filed for this visit.   Subjective Assessment - 09/16/19 1438    Subjective COVID-19 screen performed prior to patient entering clinic. DC after today    Pertinent History TKA, lumbar surgery, OA.    Patient Stated Goals Be able to use my arm    Currently in Pain? Yes    Pain Score 2     Pain Location Shoulder    Pain Orientation Left    Pain Descriptors / Indicators Aching    Pain Type Surgical pain                             [facility] Adult PT Treatment/Exercise - 09/16/19 0001      Exercises   Exercises Shoulder      Shoulder Exercises: Prone   Retraction Left;10 reps;20 reps    Retraction Weight (lbs) 3    Extension Strengthening;Left;20 reps;10 reps    Extension Weight (lbs) 3      Shoulder Exercises: Sidelying   External Rotation Strengthening;Left;10 reps;20 reps    External Rotation Weight (lbs) 1      Shoulder Exercises: Standing   Flexion Left;20 reps;10 reps    Shoulder Flexion Weight (lbs) 2    Other Standing Exercises bicep curls 3# 3x  fatigue    Other Standing Exercises wall pushups 3x5      Shoulder Exercises: Pulleys   Flexion 5 minutes      Shoulder Exercises: ROM/Strengthening   UBE (Upper Arm Bike) 8 mins RPMs 90 RPS      Modalities   Modalities Electrical Stimulation;Vasopneumatic      Electrical Stimulation   Electrical Stimulation Location L shoulder                       PT Long Term Goals - 09/16/19 1511      PT LONG TERM GOAL #1   Title Patient will be independent with HEP    Time 8    Period Weeks    Status Achieved      PT LONG TERM GOAL #2   Title Perform basci ADL's with pain </= 3/10.    Baseline unable to perform ADL's currently    Time 8    Period Weeks    Status Achieved      PT LONG TERM GOAL #3   Title Pt will improve her L shoulder flexion >/= 140 degrees.    Baseline see flowsheets    Time 8    Period Weeks    Status Achieved      PT LONG TERM GOAL #4   Title Pt  will improve her left ER to 40 degrees (limited by protocol).    Baseline see slow sheets    Status Achieved      PT LONG TERM GOAL #5   Title pt will improve grip strength on L hand to >/= 50 ppsi.    Baseline 32 ppsi    Time 8    Period Weeks    Status Not Met   NM 42#s                Plan - 09/16/19 1445    Clinical Impression Statement Pt arrived today doing fairly well with RT shldr pain 2/10. She was progressed through resistance exs for HEP and did well. All LTGs  were met except LT grip strength. Normal modality response today with decreased soreness after Rx.  DC to HEP    Personal Factors and Comorbidities Comorbidity 1;Comorbidity 2    Examination-Activity Limitations Lift;Carry;Dressing;Caring for Others;Sleep    Examination-Participation Restrictions Other    Stability/Clinical Decision Making Stable/Uncomplicated    Rehab Potential Good    PT Frequency 2x / week    PT Duration 8 weeks    PT Treatment/Interventions ADLs/Self Care Home Management;Cryotherapy;Electrical Stimulation;Ultrasound;Moist Heat;Therapeutic activities;Therapeutic exercise;Manual techniques;Patient/family education;Passive range of motion;Dry needling;Vasopneumatic Device;Taping;Functional mobility training    PT Next Visit Plan DC    Consulted and Agree with Plan of Care Patient           Patient will benefit from skilled therapeutic intervention in order to improve the following deficits and impairments:  Pain, Postural dysfunction, Impaired UE functional use, Decreased activity tolerance, Decreased strength, Decreased range of motion, Increased edema  Visit Diagnosis: Muscle weakness (generalized)  Stiffness of left shoulder, not elsewhere classified  Chronic left shoulder pain     Problem List Patient Active Problem List   Diagnosis Date Noted  . DJD (degenerative joint disease) of knee 09/09/2013  . Insomnia   . Depression with anxiety   . Hypothyroid   . Lymphocytic  colitis   . Left knee DJD     Raiana Pharris,[name], PTA 09/16/2019, 3:40 PM  [facility]  [place], [place], [address] Phone: [phone]   Fax:  [phone]  Name: Theresa Reese MRN: [id number] Date of Birth: [birth year]

## 2020-01-20 ENCOUNTER — Other Ambulatory Visit: Payer: Self-pay | Admitting: Family Medicine

## 2020-01-20 DIAGNOSIS — Z1231 Encounter for screening mammogram for malignant neoplasm of breast: Secondary | ICD-10-CM

## 2020-02-26 ENCOUNTER — Ambulatory Visit: Payer: Medicare PPO

## 2020-03-05 DIAGNOSIS — M810 Age-related osteoporosis without current pathological fracture: Secondary | ICD-10-CM | POA: Diagnosis not present

## 2020-03-05 DIAGNOSIS — N952 Postmenopausal atrophic vaginitis: Secondary | ICD-10-CM | POA: Diagnosis not present

## 2020-03-05 DIAGNOSIS — E559 Vitamin D deficiency, unspecified: Secondary | ICD-10-CM | POA: Diagnosis not present

## 2020-03-05 DIAGNOSIS — I10 Essential (primary) hypertension: Secondary | ICD-10-CM | POA: Diagnosis not present

## 2020-03-05 DIAGNOSIS — E782 Mixed hyperlipidemia: Secondary | ICD-10-CM | POA: Diagnosis not present

## 2020-03-05 DIAGNOSIS — K52832 Lymphocytic colitis: Secondary | ICD-10-CM | POA: Diagnosis not present

## 2020-03-05 DIAGNOSIS — E039 Hypothyroidism, unspecified: Secondary | ICD-10-CM | POA: Diagnosis not present

## 2020-03-05 DIAGNOSIS — R635 Abnormal weight gain: Secondary | ICD-10-CM | POA: Diagnosis not present

## 2020-03-05 DIAGNOSIS — J301 Allergic rhinitis due to pollen: Secondary | ICD-10-CM | POA: Diagnosis not present

## 2020-04-03 ENCOUNTER — Ambulatory Visit: Payer: Medicare PPO

## 2020-04-17 DIAGNOSIS — I10 Essential (primary) hypertension: Secondary | ICD-10-CM | POA: Diagnosis not present

## 2020-04-17 DIAGNOSIS — R635 Abnormal weight gain: Secondary | ICD-10-CM | POA: Diagnosis not present

## 2020-04-17 DIAGNOSIS — K52832 Lymphocytic colitis: Secondary | ICD-10-CM | POA: Diagnosis not present

## 2020-04-17 DIAGNOSIS — E559 Vitamin D deficiency, unspecified: Secondary | ICD-10-CM | POA: Diagnosis not present

## 2020-04-17 DIAGNOSIS — J301 Allergic rhinitis due to pollen: Secondary | ICD-10-CM | POA: Diagnosis not present

## 2020-04-17 DIAGNOSIS — E039 Hypothyroidism, unspecified: Secondary | ICD-10-CM | POA: Diagnosis not present

## 2020-04-17 DIAGNOSIS — E782 Mixed hyperlipidemia: Secondary | ICD-10-CM | POA: Diagnosis not present

## 2020-04-17 DIAGNOSIS — N952 Postmenopausal atrophic vaginitis: Secondary | ICD-10-CM | POA: Diagnosis not present

## 2020-04-17 DIAGNOSIS — M810 Age-related osteoporosis without current pathological fracture: Secondary | ICD-10-CM | POA: Diagnosis not present

## 2020-05-15 ENCOUNTER — Ambulatory Visit: Payer: Medicare PPO

## 2020-06-01 DIAGNOSIS — E039 Hypothyroidism, unspecified: Secondary | ICD-10-CM | POA: Diagnosis not present

## 2020-07-01 ENCOUNTER — Ambulatory Visit: Payer: Medicare PPO

## 2020-07-06 ENCOUNTER — Ambulatory Visit
Admission: RE | Admit: 2020-07-06 | Discharge: 2020-07-06 | Disposition: A | Discharge status: Home / Self Care | Payer: Medicare PPO | Source: Ambulatory Visit | Attending: Family Medicine | Admitting: Family Medicine

## 2020-07-06 ENCOUNTER — Other Ambulatory Visit: Payer: Self-pay

## 2020-07-06 DIAGNOSIS — Z1231 Encounter for screening mammogram for malignant neoplasm of breast: Secondary | ICD-10-CM

## 2020-07-07 ENCOUNTER — Other Ambulatory Visit: Payer: Self-pay | Admitting: Family Medicine

## 2020-07-07 DIAGNOSIS — R928 Other abnormal and inconclusive findings on diagnostic imaging of breast: Secondary | ICD-10-CM

## 2020-07-29 ENCOUNTER — Ambulatory Visit
Admission: RE | Admit: 2020-07-29 | Discharge: 2020-07-29 | Disposition: A | Discharge status: Home / Self Care | Payer: Medicare PPO | Source: Ambulatory Visit | Attending: Family Medicine | Admitting: Family Medicine

## 2020-07-29 ENCOUNTER — Other Ambulatory Visit: Payer: Self-pay

## 2020-07-29 DIAGNOSIS — R928 Other abnormal and inconclusive findings on diagnostic imaging of breast: Secondary | ICD-10-CM

## 2020-07-29 DIAGNOSIS — R922 Inconclusive mammogram: Secondary | ICD-10-CM | POA: Diagnosis not present

## 2020-08-21 DIAGNOSIS — E039 Hypothyroidism, unspecified: Secondary | ICD-10-CM | POA: Diagnosis not present

## 2020-08-25 DIAGNOSIS — J301 Allergic rhinitis due to pollen: Secondary | ICD-10-CM | POA: Diagnosis not present

## 2020-08-25 DIAGNOSIS — Z0000 Encounter for general adult medical examination without abnormal findings: Secondary | ICD-10-CM | POA: Diagnosis not present

## 2020-08-25 DIAGNOSIS — I10 Essential (primary) hypertension: Secondary | ICD-10-CM | POA: Diagnosis not present

## 2020-08-25 DIAGNOSIS — M810 Age-related osteoporosis without current pathological fracture: Secondary | ICD-10-CM | POA: Diagnosis not present

## 2020-08-25 DIAGNOSIS — Z6825 Body mass index (BMI) 25.0-25.9, adult: Secondary | ICD-10-CM | POA: Diagnosis not present

## 2020-08-25 DIAGNOSIS — Z1389 Encounter for screening for other disorder: Secondary | ICD-10-CM | POA: Diagnosis not present

## 2020-08-25 DIAGNOSIS — E782 Mixed hyperlipidemia: Secondary | ICD-10-CM | POA: Diagnosis not present

## 2020-08-25 DIAGNOSIS — E039 Hypothyroidism, unspecified: Secondary | ICD-10-CM | POA: Diagnosis not present

## 2020-08-26 ENCOUNTER — Other Ambulatory Visit: Payer: Self-pay | Admitting: Family Medicine

## 2020-08-26 DIAGNOSIS — M810 Age-related osteoporosis without current pathological fracture: Secondary | ICD-10-CM

## 2020-10-19 DIAGNOSIS — H16223 Keratoconjunctivitis sicca, not specified as Sjogren's, bilateral: Secondary | ICD-10-CM | POA: Diagnosis not present

## 2020-11-18 DIAGNOSIS — Z20822 Contact with and (suspected) exposure to covid-19: Secondary | ICD-10-CM | POA: Diagnosis not present

## 2020-11-20 DIAGNOSIS — R0981 Nasal congestion: Secondary | ICD-10-CM | POA: Diagnosis not present

## 2020-11-20 DIAGNOSIS — R059 Cough, unspecified: Secondary | ICD-10-CM | POA: Diagnosis not present

## 2020-11-20 DIAGNOSIS — Z20822 Contact with and (suspected) exposure to covid-19: Secondary | ICD-10-CM | POA: Diagnosis not present

## 2020-12-21 DIAGNOSIS — H524 Presbyopia: Secondary | ICD-10-CM | POA: Diagnosis not present

## 2020-12-21 DIAGNOSIS — H16223 Keratoconjunctivitis sicca, not specified as Sjogren's, bilateral: Secondary | ICD-10-CM | POA: Diagnosis not present

## 2021-02-22 DIAGNOSIS — H1013 Acute atopic conjunctivitis, bilateral: Secondary | ICD-10-CM | POA: Diagnosis not present

## 2021-02-22 DIAGNOSIS — H16223 Keratoconjunctivitis sicca, not specified as Sjogren's, bilateral: Secondary | ICD-10-CM | POA: Diagnosis not present

## 2021-03-10 ENCOUNTER — Ambulatory Visit
Admission: RE | Admit: 2021-03-10 | Discharge: 2021-03-10 | Disposition: A | Discharge status: Home / Self Care | Payer: Medicare PPO | Source: Ambulatory Visit | Attending: Family Medicine | Admitting: Family Medicine

## 2021-03-10 DIAGNOSIS — M810 Age-related osteoporosis without current pathological fracture: Secondary | ICD-10-CM

## 2021-03-10 DIAGNOSIS — Z780 Asymptomatic menopausal state: Secondary | ICD-10-CM | POA: Diagnosis not present

## 2021-03-10 DIAGNOSIS — M8589 Other specified disorders of bone density and structure, multiple sites: Secondary | ICD-10-CM | POA: Diagnosis not present

## 2021-03-29 DIAGNOSIS — J069 Acute upper respiratory infection, unspecified: Secondary | ICD-10-CM | POA: Diagnosis not present

## 2021-03-29 DIAGNOSIS — R051 Acute cough: Secondary | ICD-10-CM | POA: Diagnosis not present

## 2021-07-13 ENCOUNTER — Other Ambulatory Visit: Payer: Self-pay | Admitting: Family Medicine

## 2021-07-13 DIAGNOSIS — Z1231 Encounter for screening mammogram for malignant neoplasm of breast: Secondary | ICD-10-CM

## 2021-07-21 DIAGNOSIS — R058 Other specified cough: Secondary | ICD-10-CM | POA: Diagnosis not present

## 2021-07-30 ENCOUNTER — Ambulatory Visit
Admission: RE | Admit: 2021-07-30 | Discharge: 2021-07-30 | Disposition: A | Discharge status: Home / Self Care | Payer: Medicare PPO | Source: Ambulatory Visit | Attending: Family Medicine | Admitting: Family Medicine

## 2021-07-30 DIAGNOSIS — Z1231 Encounter for screening mammogram for malignant neoplasm of breast: Secondary | ICD-10-CM

## 2021-08-27 DIAGNOSIS — E782 Mixed hyperlipidemia: Secondary | ICD-10-CM | POA: Diagnosis not present

## 2021-08-27 DIAGNOSIS — E039 Hypothyroidism, unspecified: Secondary | ICD-10-CM | POA: Diagnosis not present

## 2021-08-27 DIAGNOSIS — E559 Vitamin D deficiency, unspecified: Secondary | ICD-10-CM | POA: Diagnosis not present

## 2021-08-27 DIAGNOSIS — I10 Essential (primary) hypertension: Secondary | ICD-10-CM | POA: Diagnosis not present

## 2021-09-03 DIAGNOSIS — Z1331 Encounter for screening for depression: Secondary | ICD-10-CM | POA: Diagnosis not present

## 2021-09-03 DIAGNOSIS — J301 Allergic rhinitis due to pollen: Secondary | ICD-10-CM | POA: Diagnosis not present

## 2021-09-03 DIAGNOSIS — Z0000 Encounter for general adult medical examination without abnormal findings: Secondary | ICD-10-CM | POA: Diagnosis not present

## 2021-09-03 DIAGNOSIS — F330 Major depressive disorder, recurrent, mild: Secondary | ICD-10-CM | POA: Diagnosis not present

## 2021-09-03 DIAGNOSIS — E039 Hypothyroidism, unspecified: Secondary | ICD-10-CM | POA: Diagnosis not present

## 2021-09-03 DIAGNOSIS — M85851 Other specified disorders of bone density and structure, right thigh: Secondary | ICD-10-CM | POA: Diagnosis not present

## 2021-09-03 DIAGNOSIS — I10 Essential (primary) hypertension: Secondary | ICD-10-CM | POA: Diagnosis not present

## 2021-09-03 DIAGNOSIS — E782 Mixed hyperlipidemia: Secondary | ICD-10-CM | POA: Diagnosis not present

## 2021-09-03 DIAGNOSIS — R058 Other specified cough: Secondary | ICD-10-CM | POA: Diagnosis not present

## 2021-10-14 DIAGNOSIS — Z79899 Other long term (current) drug therapy: Secondary | ICD-10-CM | POA: Diagnosis not present

## 2021-10-14 DIAGNOSIS — I10 Essential (primary) hypertension: Secondary | ICD-10-CM | POA: Diagnosis not present

## 2021-10-14 DIAGNOSIS — L439 Lichen planus, unspecified: Secondary | ICD-10-CM | POA: Diagnosis not present

## 2021-10-14 DIAGNOSIS — R7309 Other abnormal glucose: Secondary | ICD-10-CM | POA: Diagnosis not present

## 2021-10-14 DIAGNOSIS — E039 Hypothyroidism, unspecified: Secondary | ICD-10-CM | POA: Diagnosis not present

## 2021-10-14 DIAGNOSIS — F330 Major depressive disorder, recurrent, mild: Secondary | ICD-10-CM | POA: Diagnosis not present

## 2021-10-21 DIAGNOSIS — L508 Other urticaria: Secondary | ICD-10-CM | POA: Diagnosis not present

## 2022-01-12 DIAGNOSIS — Z6825 Body mass index (BMI) 25.0-25.9, adult: Secondary | ICD-10-CM | POA: Diagnosis not present

## 2022-01-12 DIAGNOSIS — M542 Cervicalgia: Secondary | ICD-10-CM | POA: Diagnosis not present

## 2022-04-01 DIAGNOSIS — R7309 Other abnormal glucose: Secondary | ICD-10-CM | POA: Diagnosis not present

## 2022-04-01 DIAGNOSIS — E039 Hypothyroidism, unspecified: Secondary | ICD-10-CM | POA: Diagnosis not present

## 2022-04-14 DIAGNOSIS — F330 Major depressive disorder, recurrent, mild: Secondary | ICD-10-CM | POA: Diagnosis not present

## 2022-04-14 DIAGNOSIS — N952 Postmenopausal atrophic vaginitis: Secondary | ICD-10-CM | POA: Diagnosis not present

## 2022-04-14 DIAGNOSIS — R4789 Other speech disturbances: Secondary | ICD-10-CM | POA: Diagnosis not present

## 2022-04-14 DIAGNOSIS — I10 Essential (primary) hypertension: Secondary | ICD-10-CM | POA: Diagnosis not present

## 2022-04-14 DIAGNOSIS — E039 Hypothyroidism, unspecified: Secondary | ICD-10-CM | POA: Diagnosis not present

## 2022-04-14 DIAGNOSIS — L439 Lichen planus, unspecified: Secondary | ICD-10-CM | POA: Diagnosis not present

## 2022-04-14 DIAGNOSIS — E782 Mixed hyperlipidemia: Secondary | ICD-10-CM | POA: Diagnosis not present

## 2022-04-14 DIAGNOSIS — G8929 Other chronic pain: Secondary | ICD-10-CM | POA: Diagnosis not present

## 2022-04-14 DIAGNOSIS — M546 Pain in thoracic spine: Secondary | ICD-10-CM | POA: Diagnosis not present

## 2022-04-15 ENCOUNTER — Ambulatory Visit
Admission: RE | Admit: 2022-04-15 | Discharge: 2022-04-15 | Disposition: A | Discharge status: Home / Self Care | Payer: Medicare PPO | Source: Ambulatory Visit | Attending: Family Medicine | Admitting: Family Medicine

## 2022-04-15 ENCOUNTER — Other Ambulatory Visit: Payer: Self-pay | Admitting: Family Medicine

## 2022-04-15 DIAGNOSIS — G8929 Other chronic pain: Secondary | ICD-10-CM

## 2022-04-15 DIAGNOSIS — M47814 Spondylosis without myelopathy or radiculopathy, thoracic region: Secondary | ICD-10-CM | POA: Diagnosis not present

## 2022-04-15 DIAGNOSIS — M40204 Unspecified kyphosis, thoracic region: Secondary | ICD-10-CM | POA: Diagnosis not present

## 2022-04-15 DIAGNOSIS — M549 Dorsalgia, unspecified: Secondary | ICD-10-CM | POA: Diagnosis not present

## 2022-04-28 DIAGNOSIS — R4701 Aphasia: Secondary | ICD-10-CM | POA: Diagnosis not present

## 2022-04-28 DIAGNOSIS — R413 Other amnesia: Secondary | ICD-10-CM | POA: Diagnosis not present

## 2022-04-30 DIAGNOSIS — H10013 Acute follicular conjunctivitis, bilateral: Secondary | ICD-10-CM | POA: Diagnosis not present

## 2022-05-05 ENCOUNTER — Ambulatory Visit: Payer: Medicare PPO | Attending: Family Medicine | Admitting: Physical Therapy

## 2022-05-05 ENCOUNTER — Other Ambulatory Visit: Payer: Self-pay

## 2022-05-05 DIAGNOSIS — M546 Pain in thoracic spine: Secondary | ICD-10-CM | POA: Diagnosis not present

## 2022-05-05 DIAGNOSIS — M62830 Muscle spasm of back: Secondary | ICD-10-CM | POA: Insufficient documentation

## 2022-05-05 DIAGNOSIS — R293 Abnormal posture: Secondary | ICD-10-CM | POA: Insufficient documentation
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
## 2022-05-10 ENCOUNTER — Encounter: Payer: Self-pay | Admitting: *Deleted

## 2022-05-10 ENCOUNTER — Ambulatory Visit: Payer: Medicare PPO | Admitting: *Deleted

## 2022-05-10 DIAGNOSIS — R293 Abnormal posture: Secondary | ICD-10-CM | POA: Diagnosis not present

## 2022-05-10 DIAGNOSIS — M62830 Muscle spasm of back: Secondary | ICD-10-CM

## 2022-05-10 DIAGNOSIS — M546 Pain in thoracic spine: Secondary | ICD-10-CM

## 2022-05-10 NOTE — Therapy (Signed)
OUTPATIENT PHYSICAL THERAPY THORACIC EVALUATION   Patient Name: Theresa Reese MRN: [id number] DOB:02-Jan-1951, 72 y.o., female Today's Date: 05/10/2022  END OF SESSION:  PT End of Session - 05/10/22 1741     Visit Number 2    Number of Visits 12    Date for PT Re-Evaluation 06/16/22    Authorization Type FOTO.    PT Start Time 1645    PT Stop Time 1735    PT Time Calculation (min) 50 min             Past Medical History:  Diagnosis Date   GERD (gastroesophageal reflux disease)    Hypercholesteremia    Hypertension    Hypothyroid    Insomnia    Left knee DJD    Left shoulder pain    Lymphocytic colitis    Past Surgical History:  Procedure Laterality Date   BACK SURGERY     CESAREAN SECTION  1987   CESAREAN SECTION  1990   COLONOSCOPY     EYE SURGERY Bilateral    cataract   JOINT REPLACEMENT Left    KNEE ARTHROSCOPY W/ MENISCECTOMY Left 2014   Dr Tonita Cong   low back surgery  1997   Dr Rita Ohara   low back surgery  1994   Dr Rolin Barry   RETINAL DETACHMENT SURGERY Right 2013   SHOULDER ARTHROSCOPY WITH ROTATOR CUFF REPAIR Left 05/22/2019   Procedure: LEFT SHOULDER ARTHROSCOPY WITH ROTATOR CUFF REPAIR;  Surgeon: Hiram Gash, MD;  Location: [place];  Service: Orthopedics;  Laterality: Left;   SHOULDER ARTHROSCOPY WITH SUBACROMIAL DECOMPRESSION AND BICEP TENDON REPAIR Left 05/22/2019   Procedure: LEFT SHOULDER ARTHROSCOPY  DEBRIDEMENT WITH SUBACROMIAL DECOMPRESSION AND BICEP TENODESIS;  Surgeon: Hiram Gash, MD;  Location: [place];  Service: Orthopedics;  Laterality: Left;   TOTAL KNEE ARTHROPLASTY Left 09/09/2013   Procedure: LEFT TOTAL KNEE ARTHROPLASTY;  Surgeon: Lorn Junes, MD;  Location: [place];  Service: Orthopedics;  Laterality: Left;   Patient Active Problem List   Diagnosis Date Noted   DJD (degenerative joint disease) of knee 09/09/2013   Insomnia    Depression with anxiety    Hypothyroid    Lymphocytic colitis    Left  knee DJD     REFERRING PROVIDER: Cari Caraway MD  REFERRING DIAG: Pain in thoracic spine.  Rationale for Evaluation and Treatment: Rehabilitation  THERAPY DIAG:  Pain in thoracic spine  Muscle spasm of back  Abnormal posture  ONSET DATE: 12/22/2021  SUBJECTIVE:  SUBJECTIVE STATEMENT: Pt arrived today doing fair with mid-back pain and reports having the most pain with standing act.'s  PERTINENT HISTORY:  HTN, osteopenia, left shoulder surgery, low back surgery and left knee surgery.  PAIN:  Are you having pain? Yes: NPRS scale: Moderate/10 Pain location: Mid-back, left > right. Pain description: As above. Aggravating factors: As above. Relieving factors: As above.  PRECAUTIONS: Other: Osteopenia.  WEIGHT BEARING RESTRICTIONS: No  FALLS:  Has patient fallen in last 6 months? No  LIVING ENVIRONMENT: Lives in: House/apartment Has following equipment at home: None  PLOF: Independent  PATIENT GOALS: Decrease pain.  Lift grandson without a lot less pain.   OBJECTIVE:   DIAGNOSTIC FINDINGS: X-ray IMPRESSION: 1. Osteopenia without evidence of thoracic spinal compression fracture. 2. Mild/moderate thoracic kyphosis and slight dextroscoliosis. 3. 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  PATIENT SURVEYS:  FOTO 42.   POSTURE: rounded shoulders, forward head, and increased thoracic kyphosis  PALPATION: Patient c/o palpable tenderness from ~T2 to T7 over left scapular musculature and to a lesser degree on the right.  UE ROM:  Bilateral UE AROM is WFL.  UE STRENGTH:  Normal bilateral UE strength.  DTR'S:  Normal bilateral UE DTR's.   GAIT: WNL.  TODAY'S TREATMENT:                                                                                                                               DATE:                                         05-10-22                                    EXERCISE LOG  Exercise Repetitions and Resistance Comments  Nustep L3 x 10 mins   Standing posture stretch X10 cues for ER at shldr   Hooklying posture stretch Lying on her back with arms at side Tried over towel roll, but challenging           Blank cell = exercise not performed today  Manual STW to Bil Thoracic paras in sitting with Pt resting on pillows  HMP and IFC at 80-150 Hx on 40%  x 15 minsscan to bilateral affected thoracic region. Patient tolerated treatment without complaint with normal modality response following removal of modality.   PATIENT EDUCATION:  Education details: Non-resisted scapular retraction.   Person educated: Patient Education method: Explanation, Demonstration, and Handouts Education comprehension: verbalized understanding  HOME EXERCISE PROGRAM: [place] by Mali Applegate Feb 15th, 2024 View at [email or web address] using code: [email or web address]  Page 1 of 1 1 Exercise Scapular retraction Scapular retraction.  Exercise: bring elbows back and squeeze shoulder blades together.  Mechanics: make  sure to "hike" shoulders up. Keep elbows close to side. Repeat 10 Times Hold 2 Seconds Complete 2 Sets Perform 2 Times a Day  ASSESSMENT:  CLINICAL IMPRESSION: Pt arrived today doing fair with mid-back pain and reports she has no pain lying down on her back, but her pain gets worse when standing and doing act.'s in standing. Rx focused on posture education, exs in standing as well as lying down. STW performed to thoracic paras and rhomboids with Pt. Sitting with notable TP's both sides. IFC and HMP to mid-back tolerated well  OBJECTIVE IMPAIRMENTS: decreased activity tolerance, increased muscle spasms, postural dysfunction, and pain.   ACTIVITY LIMITATIONS: carrying, lifting, and  standing  PARTICIPATION LIMITATIONS: meal prep, cleaning, and laundry  REHAB POTENTIAL: Good  CLINICAL DECISION MAKING: Stable/uncomplicated  EVALUATION COMPLEXITY: Low   GOALS:  LONG TERM GOALS: Target date: 06/16/22  Ind with an HEP.  Goal status: INITIAL  2.  Lift grandson with pain not exceeding 2-3/10.  Goal status: INITIAL  3.  Perform ADL's with pain not exceeding 2-3/10.  Goal status: INITIAL  PLAN:  PT FREQUENCY: 2x/week  PT DURATION: 6 weeks  PLANNED INTERVENTIONS: Therapeutic exercises, Therapeutic activity, Patient/Family education, Self Care, Dry Needling, Electrical stimulation, Cryotherapy, Moist heat, Ultrasound, and Manual therapy.  PLAN FOR NEXT SESSION: Combo e'stim/US, STW/M, postural exercises, scapular exercises.   Dwight Adamczak,[name], PTA 05/10/2022, 6:06 PM

## 2022-05-12 ENCOUNTER — Ambulatory Visit: Payer: Medicare PPO | Admitting: *Deleted

## 2022-05-12 ENCOUNTER — Encounter: Payer: Self-pay | Admitting: *Deleted

## 2022-05-12 DIAGNOSIS — R293 Abnormal posture: Secondary | ICD-10-CM | POA: Diagnosis not present

## 2022-05-12 DIAGNOSIS — M62830 Muscle spasm of back: Secondary | ICD-10-CM

## 2022-05-12 DIAGNOSIS — M546 Pain in thoracic spine: Secondary | ICD-10-CM | POA: Diagnosis not present

## 2022-05-12 NOTE — Therapy (Signed)
OUTPATIENT PHYSICAL THERAPY THORACIC TREATMENT   Patient Name: Theresa Reese MRN: [id number] DOB:1950-07-20, 72 y.o., female Today's Date: 05/12/2022  END OF SESSION:  PT End of Session - 05/12/22 1315     Visit Number 3    Number of Visits 12    Date for PT Re-Evaluation 06/16/22    Authorization Type FOTO.    PT Start Time 1300    PT Stop Time 1350    PT Time Calculation (min) 50 min             Past Medical History:  Diagnosis Date   GERD (gastroesophageal reflux disease)    Hypercholesteremia    Hypertension    Hypothyroid    Insomnia    Left knee DJD    Left shoulder pain    Lymphocytic colitis    Past Surgical History:  Procedure Laterality Date   BACK SURGERY     CESAREAN SECTION  1987   CESAREAN SECTION  1990   COLONOSCOPY     EYE SURGERY Bilateral    cataract   JOINT REPLACEMENT Left    KNEE ARTHROSCOPY W/ MENISCECTOMY Left 2014   Dr Tonita Cong   low back surgery  1997   Dr Rita Ohara   low back surgery  1994   Dr Rolin Barry   RETINAL DETACHMENT SURGERY Right 2013   SHOULDER ARTHROSCOPY WITH ROTATOR CUFF REPAIR Left 05/22/2019   Procedure: LEFT SHOULDER ARTHROSCOPY WITH ROTATOR CUFF REPAIR;  Surgeon: Hiram Gash, MD;  Location: [place];  Service: Orthopedics;  Laterality: Left;   SHOULDER ARTHROSCOPY WITH SUBACROMIAL DECOMPRESSION AND BICEP TENDON REPAIR Left 05/22/2019   Procedure: LEFT SHOULDER ARTHROSCOPY  DEBRIDEMENT WITH SUBACROMIAL DECOMPRESSION AND BICEP TENODESIS;  Surgeon: Hiram Gash, MD;  Location: [place];  Service: Orthopedics;  Laterality: Left;   TOTAL KNEE ARTHROPLASTY Left 09/09/2013   Procedure: LEFT TOTAL KNEE ARTHROPLASTY;  Surgeon: Lorn Junes, MD;  Location: [place];  Service: Orthopedics;  Laterality: Left;   Patient Active Problem List   Diagnosis Date Noted   DJD (degenerative joint disease) of knee 09/09/2013   Insomnia    Depression with anxiety    Hypothyroid    Lymphocytic colitis    Left  knee DJD     REFERRING PROVIDER: Cari Caraway MD  REFERRING DIAG: Pain in thoracic spine.  Rationale for Evaluation and Treatment: Rehabilitation  THERAPY DIAG:  Pain in thoracic spine  Muscle spasm of back  Abnormal posture  ONSET DATE: 12/22/2021  SUBJECTIVE:  SUBJECTIVE STATEMENT: Pt arrived today doing fair with mid-back pain and reports having some soreness after last Rx   PERTINENT HISTORY:  HTN, osteopenia, left shoulder surgery, low back surgery and left knee surgery.  PAIN:  Are you having pain? Yes: NPRS scale: Moderate/10 Pain location: Mid-back, left > right. Pain description: As above. Aggravating factors: As above. Relieving factors: As above.  PRECAUTIONS: Other: Osteopenia.  WEIGHT BEARING RESTRICTIONS: No  FALLS:  Has patient fallen in last 6 months? No  LIVING ENVIRONMENT: Lives in: House/apartment Has following equipment at home: None  PLOF: Independent  PATIENT GOALS: Decrease pain.  Lift grandson without a lot less pain.   OBJECTIVE:   DIAGNOSTIC FINDINGS: X-ray IMPRESSION: 1. Osteopenia without evidence of thoracic spinal compression fracture. 2. Mild/moderate thoracic kyphosis and slight dextroscoliosis. 3. 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  PATIENT SURVEYS:  FOTO 42.   POSTURE: rounded shoulders, forward head, and increased thoracic kyphosis  PALPATION: Patient c/o palpable tenderness from ~T2 to T7 over left scapular musculature and to a lesser degree on the right.  UE ROM:  Bilateral UE AROM is WFL.  UE STRENGTH:  Normal bilateral UE strength.  DTR'S:  Normal bilateral UE DTR's.   GAIT: WNL.  TODAY'S TREATMENT:                                                                                                                               DATE:                                         05-12-22                                    EXERCISE LOG  Exercise Repetitions and Resistance Comments  Nustep L3 x 10 mins   Standing posture stretch    Hooklying posture stretch  Tried over towel roll, but challenging           Blank cell = exercise not performed today  Manual STW to Bil Thoracic paras in sitting with Pt resting on pillows Combo 1.5 w/cm2 x 16 min total. 8 mins each side Thoracic paras   HMP and IFC at 80-150 Hx on 40%  x 15 minsscan to bilateral affected thoracic region. Patient tolerated treatment without complaint with normal modality response following removal of modality.   PATIENT EDUCATION:  Education details: Non-resisted scapular retraction.   Person educated: Patient Education method: Explanation, Demonstration, and Handouts Education comprehension: verbalized understanding  HOME EXERCISE PROGRAM: [place] by Mali Applegate Feb 15th, 2024 View at [email or web address] using code: [email or web address]  Page 1 of 1 1 Exercise Scapular retraction Scapular retraction.  Exercise: bring elbows back and squeeze shoulder blades together.  Mechanics: make sure to "hike" shoulders up. Keep elbows close to side. Repeat 10 Times Hold 2 Seconds Complete 2 Sets Perform 2 Times a Day  ASSESSMENT:  CLINICAL IMPRESSION: Pt arrived today reporting soreness after last Rx , but better today. Rx focused on some exs as well as [place] combo, STW and Estim. Pt still with notable soreness Bil. Upper thoracic paras. Pt reports decreased pain end of session. Discussed and reviewed posture again.   OBJECTIVE IMPAIRMENTS: decreased activity tolerance, increased muscle spasms, postural dysfunction, and pain.   ACTIVITY LIMITATIONS: carrying, lifting, and standing  PARTICIPATION LIMITATIONS: meal prep, cleaning, and laundry  REHAB POTENTIAL:  Good  CLINICAL DECISION MAKING: Stable/uncomplicated  EVALUATION COMPLEXITY: Low   GOALS:  LONG TERM GOALS: Target date: 06/16/22  Ind with an HEP.  Goal status: INITIAL  2.  Lift grandson with pain not exceeding 2-3/10.  Goal status: INITIAL  3.  Perform ADL's with pain not exceeding 2-3/10.  Goal status: INITIAL  PLAN:  PT FREQUENCY: 2x/week  PT DURATION: 6 weeks  PLANNED INTERVENTIONS: Therapeutic exercises, Therapeutic activity, Patient/Family education, Self Care, Dry Needling, Electrical stimulation, Cryotherapy, Moist heat, Ultrasound, and Manual therapy.  PLAN FOR NEXT SESSION: Combo e'stim/US, STW/M, postural exercises, scapular exercises.   Baby Gieger,[name], PTA 05/12/2022, 6:08 PM

## 2022-05-17 ENCOUNTER — Ambulatory Visit: Payer: Medicare PPO | Admitting: *Deleted

## 2022-05-17 DIAGNOSIS — M62830 Muscle spasm of back: Secondary | ICD-10-CM

## 2022-05-17 DIAGNOSIS — R293 Abnormal posture: Secondary | ICD-10-CM

## 2022-05-17 DIAGNOSIS — M546 Pain in thoracic spine: Secondary | ICD-10-CM | POA: Diagnosis not present

## 2022-05-17 NOTE — Therapy (Signed)
OUTPATIENT PHYSICAL THERAPY THORACIC TREATMENT   Patient Name: Theresa Reese MRN: [id number] DOB:03-Dec-1950, 72 y.o., female Today's Date: 05/17/2022  END OF SESSION:  PT End of Session - 05/17/22 1653     Visit Number 4    Number of Visits 12    Date for PT Re-Evaluation 06/16/22    Authorization Type FOTO.    PT Start Time 1645    PT Stop Time 1738    PT Time Calculation (min) 53 min             Past Medical History:  Diagnosis Date   GERD (gastroesophageal reflux disease)    Hypercholesteremia    Hypertension    Hypothyroid    Insomnia    Left knee DJD    Left shoulder pain    Lymphocytic colitis    Past Surgical History:  Procedure Laterality Date   BACK SURGERY     CESAREAN SECTION  1987   CESAREAN SECTION  1990   COLONOSCOPY     EYE SURGERY Bilateral    cataract   JOINT REPLACEMENT Left    KNEE ARTHROSCOPY W/ MENISCECTOMY Left 2014   Dr Tonita Cong   low back surgery  1997   Dr Rita Ohara   low back surgery  1994   Dr Rolin Barry   RETINAL DETACHMENT SURGERY Right 2013   SHOULDER ARTHROSCOPY WITH ROTATOR CUFF REPAIR Left 05/22/2019   Procedure: LEFT SHOULDER ARTHROSCOPY WITH ROTATOR CUFF REPAIR;  Surgeon: Hiram Gash, MD;  Location: [place];  Service: Orthopedics;  Laterality: Left;   SHOULDER ARTHROSCOPY WITH SUBACROMIAL DECOMPRESSION AND BICEP TENDON REPAIR Left 05/22/2019   Procedure: LEFT SHOULDER ARTHROSCOPY  DEBRIDEMENT WITH SUBACROMIAL DECOMPRESSION AND BICEP TENODESIS;  Surgeon: Hiram Gash, MD;  Location: [place];  Service: Orthopedics;  Laterality: Left;   TOTAL KNEE ARTHROPLASTY Left 09/09/2013   Procedure: LEFT TOTAL KNEE ARTHROPLASTY;  Surgeon: Lorn Junes, MD;  Location: [place];  Service: Orthopedics;  Laterality: Left;   Patient Active Problem List   Diagnosis Date Noted   DJD (degenerative joint disease) of knee 09/09/2013   Insomnia    Depression with anxiety    Hypothyroid    Lymphocytic colitis    Left  knee DJD     REFERRING PROVIDER: Cari Caraway MD  REFERRING DIAG: Pain in thoracic spine.  Rationale for Evaluation and Treatment: Rehabilitation  THERAPY DIAG:  Pain in thoracic spine  Muscle spasm of back  Abnormal posture  ONSET DATE: 12/22/2021  SUBJECTIVE:  SUBJECTIVE STATEMENT: Pt arrived today doing fair with mid-back pain and reports that it has moved up some toward the neck on the LT side   PERTINENT HISTORY:  HTN, osteopenia, left shoulder surgery, low back surgery and left knee surgery.  PAIN:  Are you having pain? Yes: NPRS scale: Moderate/10 Pain location: Mid-back, left > right. Pain description: As above. Aggravating factors: As above. Relieving factors: As above.  PRECAUTIONS: Other: Osteopenia.  WEIGHT BEARING RESTRICTIONS: No  FALLS:  Has patient fallen in last 6 months? No  LIVING ENVIRONMENT: Lives in: House/apartment Has following equipment at home: None  PLOF: Independent  PATIENT GOALS: Decrease pain.  Lift grandson without a lot less pain.   OBJECTIVE:   DIAGNOSTIC FINDINGS: X-ray IMPRESSION: 1. Osteopenia without evidence of thoracic spinal compression fracture. 2. Mild/moderate thoracic kyphosis and slight dextroscoliosis. 3. 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  PATIENT SURVEYS:  FOTO 42.   POSTURE: rounded shoulders, forward head, and increased thoracic kyphosis  PALPATION: Patient c/o palpable tenderness from ~T2 to T7 over left scapular musculature and to a lesser degree on the right.  UE ROM:  Bilateral UE AROM is WFL.  UE STRENGTH:  Normal bilateral UE strength.  DTR'S:  Normal bilateral UE DTR's.   GAIT: WNL.  TODAY'S TREATMENT:                                                                                                                               DATE:                                         05-17-22                                    EXERCISE LOG  Exercise Repetitions and Resistance Comments  Nustep    UBE X 8 mins   Standing posture stretch    Hooklying posture stretch  Tried over towel roll, but challenging           Blank cell = exercise not performed today  Manual STW to Bil Thoracic paras and into UT LT side in sitting with Pt resting on pillows Combo 1.5 w/cm2 x 12 min total. 6 mins each side Thoracic paras   HMP and IFC at 80-150 Hx on 40%  x 15 minsscan to bilateral affected thoracic region. Patient tolerated treatment without complaint with normal modality response following removal of modality.   PATIENT EDUCATION:  Education details: Non-resisted scapular retraction.   Person educated: Patient Education method: Explanation, Demonstration, and Handouts Education comprehension: verbalized understanding  HOME EXERCISE PROGRAM: [place] by Mali Applegate Feb 15th, 2024 View at [email or web address] using code: [email or web address]  Page 1 of 1 1 Exercise  Scapular retraction Scapular retraction.  Exercise: bring elbows back and squeeze shoulder blades together.  Mechanics: make sure to "hike" shoulders up. Keep elbows close to side. Repeat 10 Times Hold 2 Seconds Complete 2 Sets Perform 2 Times a Day  ASSESSMENT:  CLINICAL IMPRESSION: Pt arrived today reporting in creased soreness in thoracic paras and into LT side lower neck C7 area  Rx focused on some exs as well as [place] combo, STW and Estim. Pt still with notable soreness Bil. Upper thoracic paras, but tolerated STW better today.  Pt reports decreased pain end of session. Discussed and reviewed posture again.   OBJECTIVE IMPAIRMENTS: decreased activity tolerance, increased muscle spasms, postural dysfunction, and pain.   ACTIVITY LIMITATIONS: carrying,  lifting, and standing  PARTICIPATION LIMITATIONS: meal prep, cleaning, and laundry  REHAB POTENTIAL: Good  CLINICAL DECISION MAKING: Stable/uncomplicated  EVALUATION COMPLEXITY: Low   GOALS:  LONG TERM GOALS: Target date: 06/16/22  Ind with an HEP.  Goal status: INITIAL  2.  Lift grandson with pain not exceeding 2-3/10.  Goal status: INITIAL  3.  Perform ADL's with pain not exceeding 2-3/10.  Goal status: INITIAL  PLAN:  PT FREQUENCY: 2x/week  PT DURATION: 6 weeks  PLANNED INTERVENTIONS: Therapeutic exercises, Therapeutic activity, Patient/Family education, Self Care, Dry Needling, Electrical stimulation, Cryotherapy, Moist heat, Ultrasound, and Manual therapy.  PLAN FOR NEXT SESSION: Combo e'stim/US, STW/M, postural exercises, scapular exercises.   Linford Quintela,[name], PTA 05/17/2022, 5:38 PM

## 2022-05-19 ENCOUNTER — Encounter: Payer: Medicare PPO | Admitting: *Deleted

## 2022-05-24 ENCOUNTER — Encounter: Payer: Self-pay | Admitting: *Deleted

## 2022-05-24 ENCOUNTER — Ambulatory Visit: Payer: Medicare PPO | Attending: Family Medicine | Admitting: *Deleted

## 2022-05-24 DIAGNOSIS — M546 Pain in thoracic spine: Secondary | ICD-10-CM | POA: Diagnosis not present

## 2022-05-24 DIAGNOSIS — R293 Abnormal posture: Secondary | ICD-10-CM | POA: Diagnosis not present

## 2022-05-24 DIAGNOSIS — M62830 Muscle spasm of back: Secondary | ICD-10-CM | POA: Diagnosis not present

## 2022-05-24 NOTE — Therapy (Signed)
OUTPATIENT PHYSICAL THERAPY THORACIC TREATMENT   Patient Name: Theresa Reese MRN: [id number] DOB:[birth year], 72 y.o., female Today's Date: 05/24/2022  END OF SESSION:  PT End of Session - 05/24/22 1628     Visit Number 5    Number of Visits 12    Date for PT Re-Evaluation 06/16/22    Authorization Type FOTO.    PT Start Time 1615    PT Stop Time 1705    PT Time Calculation (min) 50 min             Past Medical History:  Diagnosis Date   GERD (gastroesophageal reflux disease)    Hypercholesteremia    Hypertension    Hypothyroid    Insomnia    Left knee DJD    Left shoulder pain    Lymphocytic colitis    Past Surgical History:  Procedure Laterality Date   BACK SURGERY     CESAREAN SECTION  1987   CESAREAN SECTION  1990   COLONOSCOPY     EYE SURGERY Bilateral    cataract   JOINT REPLACEMENT Left    KNEE ARTHROSCOPY W/ MENISCECTOMY Left 2014   Dr Tonita Cong   low back surgery  1997   Dr Rita Ohara   low back surgery  1994   Dr Rolin Barry   RETINAL DETACHMENT SURGERY Right 2013   SHOULDER ARTHROSCOPY WITH ROTATOR CUFF REPAIR Left 05/22/2019   Procedure: LEFT SHOULDER ARTHROSCOPY WITH ROTATOR CUFF REPAIR;  Surgeon: Hiram Gash, MD;  Location: [place];  Service: Orthopedics;  Laterality: Left;   SHOULDER ARTHROSCOPY WITH SUBACROMIAL DECOMPRESSION AND BICEP TENDON REPAIR Left 05/22/2019   Procedure: LEFT SHOULDER ARTHROSCOPY  DEBRIDEMENT WITH SUBACROMIAL DECOMPRESSION AND BICEP TENODESIS;  Surgeon: Hiram Gash, MD;  Location: [place];  Service: Orthopedics;  Laterality: Left;   TOTAL KNEE ARTHROPLASTY Left 09/09/2013   Procedure: LEFT TOTAL KNEE ARTHROPLASTY;  Surgeon: Lorn Junes, MD;  Location: [place];  Service: Orthopedics;  Laterality: Left;   Patient Active Problem List   Diagnosis Date Noted   DJD (degenerative joint disease) of knee 09/09/2013   Insomnia    Depression with anxiety    Hypothyroid    Lymphocytic colitis    Left  knee DJD     REFERRING PROVIDER: Cari Caraway MD  REFERRING DIAG: Pain in thoracic spine.  Rationale for Evaluation and Treatment: Rehabilitation  THERAPY DIAG:  Pain in thoracic spine  Muscle spasm of back  Abnormal posture  ONSET DATE: 12/22/2021  SUBJECTIVE:  SUBJECTIVE STATEMENT: Pt arrived today with increased pain today especially LT side Utrap and midback and hard to turn LT   PERTINENT HISTORY:  HTN, osteopenia, left shoulder surgery, low back surgery and left knee surgery.  PAIN:  Are you having pain? Yes: NPRS scale: 7/10 Pain location: Mid-back, left > right. Pain description: As above. Aggravating factors: As above. Relieving factors: As above.  PRECAUTIONS: Other: Osteopenia.  WEIGHT BEARING RESTRICTIONS: No  FALLS:  Has patient fallen in last 6 months? No  LIVING ENVIRONMENT: Lives in: House/apartment Has following equipment at home: None  PLOF: Independent  PATIENT GOALS: Decrease pain.  Lift grandson without a lot less pain.   OBJECTIVE:   DIAGNOSTIC FINDINGS: X-ray IMPRESSION: 1. Osteopenia without evidence of thoracic spinal compression fracture. 2. Mild/moderate thoracic kyphosis and slight dextroscoliosis. 3. 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  PATIENT SURVEYS:  FOTO 42.   POSTURE: rounded shoulders, forward head, and increased thoracic kyphosis  PALPATION: Patient c/o palpable tenderness from ~T2 to T7 over left scapular musculature and to a lesser degree on the right.  UE ROM:  Bilateral UE AROM is WFL.  UE STRENGTH:  Normal bilateral UE strength.  DTR'S:  Normal bilateral UE DTR's.   GAIT: WNL.  TODAY'S TREATMENT:                                                                                                                               DATE:                                         05-24-22                                    EXERCISE LOG  Exercise Repetitions and Resistance Comments  Nustep    UBE    Standing posture stretch    Hooklying posture stretch  Tried over towel roll, but challenging           Blank cell = exercise not performed today  Manual STW to LT  UT, Levator scap and into cerv.paras.  with Pt sitting and resting  arms on pillows Combo 1.5 w/cm2 x 12 min total. To LT UT, Levator scap., and into LT side cerv. Paras.   HMP and IFC at 80-150 Hx on 40%  x 15 mins scan to LT side. Patient tolerated treatment without complaint with normal modality response following removal of modality.   PATIENT EDUCATION:  Education details: Non-resisted scapular retraction.   Person educated: Patient Education method: Explanation, Demonstration, and Handouts Education comprehension: verbalized understanding  HOME EXERCISE PROGRAM: [place] by Mali Applegate Feb 15th, 2024 View at [email or web address] using code: [id number]  Page 1 of 1 1 Exercise  Scapular retraction Scapular retraction.  Exercise: bring elbows back and squeeze shoulder blades together.  Mechanics: make sure to "hike" shoulders up. Keep elbows close to side. Repeat 10 Times Hold 2 Seconds Complete 2 Sets Perform 2 Times a Day  ASSESSMENT:  CLINICAL IMPRESSION: Pt arrived today reporting increased LT side  thoracic pain and into LT Utrap, levator scap, cerv.  Paras. Rx focused mainly on LT side due to Pt was unable to turn her head to the LT due to LT sided pain. Pt did well and had 30 degrees improvement end of session. Pt given DN paper to sign and bring back next Rx.    OBJECTIVE IMPAIRMENTS: decreased activity tolerance, increased muscle spasms, postural dysfunction, and pain.   ACTIVITY LIMITATIONS: carrying, lifting, and standing  PARTICIPATION  LIMITATIONS: meal prep, cleaning, and laundry  REHAB POTENTIAL: Good  CLINICAL DECISION MAKING: Stable/uncomplicated  EVALUATION COMPLEXITY: Low   GOALS:  LONG TERM GOALS: Target date: 06/16/22  Ind with an HEP.  Goal status: INITIAL  2.  Lift grandson with pain not exceeding 2-3/10.  Goal status: INITIAL  3.  Perform ADL's with pain not exceeding 2-3/10.  Goal status: INITIAL  PLAN:  PT FREQUENCY: 2x/week  PT DURATION: 6 weeks  PLANNED INTERVENTIONS: Therapeutic exercises, Therapeutic activity, Patient/Family education, Self Care, Dry Needling, Electrical stimulation, Cryotherapy, Moist heat, Ultrasound, and Manual therapy.  PLAN FOR NEXT SESSION: Combo e'stim/US, STW/M, postural exercises, scapular exercises.   Zaylei Mullane,[name], PTA 05/24/2022, 5:40 PM

## 2022-05-26 ENCOUNTER — Ambulatory Visit: Payer: Medicare PPO | Admitting: Physical Therapy

## 2022-05-26 DIAGNOSIS — R293 Abnormal posture: Secondary | ICD-10-CM

## 2022-05-26 DIAGNOSIS — M62830 Muscle spasm of back: Secondary | ICD-10-CM | POA: Diagnosis not present

## 2022-05-26 DIAGNOSIS — M546 Pain in thoracic spine: Secondary | ICD-10-CM | POA: Diagnosis not present

## 2022-05-26 NOTE — Therapy (Signed)
OUTPATIENT PHYSICAL THERAPY THORACIC TREATMENT   Patient Name: Theresa Reese MRN: [id number] DOB:1950/04/03, 72 y.o., female Today's Date: 05/26/2022  END OF SESSION:  PT End of Session - 05/26/22 1748     Visit Number 6    Number of Visits 12    Date for PT Re-Evaluation 06/16/22    Authorization Type FOTO.    PT Start Time 0440    PT Stop Time 0536    PT Time Calculation (min) 56 min    Activity Tolerance Patient tolerated treatment well    Behavior During Therapy WFL for tasks assessed/performed              Past Medical History:  Diagnosis Date   GERD (gastroesophageal reflux disease)    Hypercholesteremia    Hypertension    Hypothyroid    Insomnia    Left knee DJD    Left shoulder pain    Lymphocytic colitis    Past Surgical History:  Procedure Laterality Date   BACK SURGERY     CESAREAN [id number]   COLONOSCOPY     EYE SURGERY Bilateral    cataract   JOINT REPLACEMENT Left    KNEE ARTHROSCOPY W/ MENISCECTOMY Left 2014   Dr Tonita Cong   low back surgery  1997   Dr Rita Ohara   low back surgery  1994   Dr Rolin Barry   RETINAL DETACHMENT SURGERY Right 2013   SHOULDER ARTHROSCOPY WITH ROTATOR CUFF REPAIR Left 05/22/2019   Procedure: LEFT SHOULDER ARTHROSCOPY WITH ROTATOR CUFF REPAIR;  Surgeon: Hiram Gash, MD;  Location: [place];  Service: Orthopedics;  Laterality: Left;   SHOULDER ARTHROSCOPY WITH SUBACROMIAL DECOMPRESSION AND BICEP TENDON REPAIR Left 05/22/2019   Procedure: LEFT SHOULDER ARTHROSCOPY  DEBRIDEMENT WITH SUBACROMIAL DECOMPRESSION AND BICEP TENODESIS;  Surgeon: Hiram Gash, MD;  Location: [place];  Service: Orthopedics;  Laterality: Left;   TOTAL KNEE ARTHROPLASTY Left 09/09/2013   Procedure: LEFT TOTAL KNEE ARTHROPLASTY;  Surgeon: Lorn Junes, MD;  Location: [place];  Service: Orthopedics;  Laterality: Left;   Patient Active Problem List   Diagnosis Date Noted   DJD (degenerative joint  disease) of knee 09/09/2013   Insomnia    Depression with anxiety    Hypothyroid    Lymphocytic colitis    Left knee DJD     REFERRING PROVIDER: Cari Caraway MD  REFERRING DIAG: Pain in thoracic spine.  Rationale for Evaluation and Treatment: Rehabilitation  THERAPY DIAG:  Pain in thoracic spine  Muscle spasm of back  Abnormal posture  ONSET DATE: 12/22/2021  SUBJECTIVE:  SUBJECTIVE STATEMENT: Last treatment with helpful to UT's.  Pain about a 3-4/10 but she has not lifted her grandson today.   PERTINENT HISTORY:  HTN, osteopenia, left shoulder surgery, low back surgery and left knee surgery.  PAIN:  Are you having pain? Yes: NPRS scale: 3-4/10 Pain location: Mid-back, left > right. Pain description: As above. Aggravating factors: As above. Relieving factors: As above.  PRECAUTIONS: Other: Osteopenia.  WEIGHT BEARING RESTRICTIONS: No  FALLS:  Has patient fallen in last 6 months? No  LIVING ENVIRONMENT: Lives in: House/apartment Has following equipment at home: None  PLOF: Independent  PATIENT GOALS: Decrease pain.  Lift grandson without a lot less pain.   OBJECTIVE:   DIAGNOSTIC FINDINGS: X-ray IMPRESSION: 1. Osteopenia without evidence of thoracic spinal compression fracture. 2. Mild/moderate thoracic kyphosis and slight dextroscoliosis. 3. 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  PATIENT SURVEYS:  FOTO 42.   POSTURE: rounded shoulders, forward head, and increased thoracic kyphosis  PALPATION: Patient c/o palpable tenderness from ~T2 to T7 over left scapular musculature and to a lesser degree on the right.  UE ROM:  Bilateral UE AROM is WFL.  UE STRENGTH:  Normal bilateral UE strength.  DTR'S:  Normal  bilateral UE DTR's.   GAIT: WNL.  TODAY'S TREATMENT:                                                                                                                              DATE:             05/26/22  Prone over pillow on plinth with face in breath hole:  Combo e'stim/US at 1.50 W/CM2 x 12 minutes to patient upper to mid-thoracic region bilaterally f/b STW/M x 12 minutes with gentle costo-vertebral and PA mobs to her thoracic spinous processes (T2 to T7) f/b HMP and IFC at 80-150 Hz on 40% scan x 20 minutes in supine with LE's on wedge for comfort.  Patient tolerated treatment without complaint with normal modality response following removal of modality.     ASSESSMENT:  CLINICAL IMPRESSION: Good response to last treatment.  She did well with treatment.  Left thoracic was particularly tender to palpation and a stiffness with mobs which were tolerated well today.     OBJECTIVE IMPAIRMENTS: decreased activity tolerance, increased muscle spasms, postural dysfunction, and pain.   ACTIVITY LIMITATIONS: carrying, lifting, and standing  PARTICIPATION LIMITATIONS: meal prep, cleaning, and laundry  REHAB POTENTIAL: Good  CLINICAL DECISION MAKING: Stable/uncomplicated  EVALUATION COMPLEXITY: Low   GOALS:  LONG TERM GOALS: Target date: 06/16/22  Ind with an HEP.  Goal status: INITIAL  2.  Lift grandson with pain not exceeding 2-3/10.  Goal status: INITIAL  3.  Perform ADL's with pain not exceeding 2-3/10.  Goal status: INITIAL  PLAN:  PT FREQUENCY: 2x/week  PT DURATION: 6 weeks  PLANNED INTERVENTIONS: Therapeutic exercises, Therapeutic activity, Patient/Family education, Self Care, Dry Needling, Electrical stimulation,  Cryotherapy, Moist heat, Ultrasound, and Manual therapy.  PLAN FOR NEXT SESSION: Combo e'stim/US, STW/M, postural exercises, scapular exercises.   Bland Rudzinski, [place], PT 05/26/2022, 5:57 PM

## 2022-05-31 ENCOUNTER — Ambulatory Visit: Payer: Medicare PPO | Admitting: *Deleted

## 2022-05-31 ENCOUNTER — Encounter: Payer: Self-pay | Admitting: *Deleted

## 2022-05-31 DIAGNOSIS — R293 Abnormal posture: Secondary | ICD-10-CM

## 2022-05-31 DIAGNOSIS — M546 Pain in thoracic spine: Secondary | ICD-10-CM

## 2022-05-31 DIAGNOSIS — M62830 Muscle spasm of back: Secondary | ICD-10-CM

## 2022-05-31 NOTE — Therapy (Signed)
OUTPATIENT PHYSICAL THERAPY THORACIC TREATMENT   Patient Name: Theresa Reese MRN: [id number] DOB:1950-10-01, 72 y.o., female Today's Date: 05/31/2022  END OF SESSION:  PT End of Session - 05/31/22 1644     Visit Number 7    Number of Visits 12    Date for PT Re-Evaluation 06/16/22    Authorization Type FOTO.    PT Start Time 1645    PT Stop Time 1739    PT Time Calculation (min) 54 min              Past Medical History:  Diagnosis Date   GERD (gastroesophageal reflux disease)    Hypercholesteremia    Hypertension    Hypothyroid    Insomnia    Left knee DJD    Left shoulder pain    Lymphocytic colitis    Past Surgical History:  Procedure Laterality Date   BACK SURGERY     CESAREAN SECTION  1987   CESAREAN SECTION  1990   COLONOSCOPY     EYE SURGERY Bilateral    cataract   JOINT REPLACEMENT Left    KNEE ARTHROSCOPY W/ MENISCECTOMY Left 2014   Dr Tonita Cong   low back surgery  1997   Dr Rita Ohara   low back surgery  1994   Dr Rolin Barry   RETINAL DETACHMENT SURGERY Right 2013   SHOULDER ARTHROSCOPY WITH ROTATOR CUFF REPAIR Left 05/22/2019   Procedure: LEFT SHOULDER ARTHROSCOPY WITH ROTATOR CUFF REPAIR;  Surgeon: Hiram Gash, MD;  Location: [place];  Service: Orthopedics;  Laterality: Left;   SHOULDER ARTHROSCOPY WITH SUBACROMIAL DECOMPRESSION AND BICEP TENDON REPAIR Left 05/22/2019   Procedure: LEFT SHOULDER ARTHROSCOPY  DEBRIDEMENT WITH SUBACROMIAL DECOMPRESSION AND BICEP TENODESIS;  Surgeon: Hiram Gash, MD;  Location: [facility];  Service: Orthopedics;  Laterality: Left;   TOTAL KNEE ARTHROPLASTY Left 09/09/2013   Procedure: LEFT TOTAL KNEE ARTHROPLASTY;  Surgeon: Lorn Junes, MD;  Location: [place];  Service: Orthopedics;  Laterality: Left;   Patient Active Problem List   Diagnosis Date Noted   DJD (degenerative joint disease) of knee 09/09/2013   Insomnia    Depression with anxiety    Hypothyroid    Lymphocytic colitis    Left  knee DJD     REFERRING PROVIDER: Cari Caraway MD  REFERRING DIAG: Pain in thoracic spine.  Rationale for Evaluation and Treatment: Rehabilitation  THERAPY DIAG:  Pain in thoracic spine  Muscle spasm of back  Abnormal posture  ONSET DATE: 12/22/2021  SUBJECTIVE:  SUBJECTIVE STATEMENT: Last treatment with helpful to mid-back.  Pain about a 4-5/10 ,but she has lifted her grandson today.   PERTINENT HISTORY:  HTN, osteopenia, left shoulder surgery, low back surgery and left knee surgery.  PAIN:  Are you having pain? Yes: NPRS scale: 4-5/10 Pain location: Mid-back, left > right. Pain description: As above. Aggravating factors: As above. Relieving factors: As above.  PRECAUTIONS: Other: Osteopenia.  WEIGHT BEARING RESTRICTIONS: No  FALLS:  Has patient fallen in last 6 months? No  LIVING ENVIRONMENT: Lives in: House/apartment Has following equipment at home: None  PLOF: Independent  PATIENT GOALS: Decrease pain.  Lift grandson without a lot less pain.   OBJECTIVE:   DIAGNOSTIC FINDINGS: X-ray IMPRESSION: 1. Osteopenia without evidence of thoracic spinal compression fracture. 2. Mild/moderate thoracic kyphosis and slight dextroscoliosis. 3. 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  PATIENT SURVEYS:  FOTO 42.   POSTURE: rounded shoulders, forward head, and increased thoracic kyphosis  PALPATION: Patient c/o palpable tenderness from ~T2 to T7 over left scapular musculature and to a lesser degree on the right.  UE ROM:  Bilateral UE AROM is WFL.  UE STRENGTH:  Normal bilateral UE strength.  DTR'S:  Normal bilateral UE DTR's.   GAIT: WNL.  TODAY'S TREATMENT:                                                                                                                               DATE:             05/31/22 UBE x 6 mins Prone over pillow on plinth with face in breath hole:  Combo e'stim/US at 1.50 W/CM2 x 12 minutes to patient upper to mid-thoracic region bilaterally   STW/M x 12 minutes  to Bil. Thoracic paras Gentle costo-vertebral and PA mobs to her thoracic spinous processes (T2 to T7) by MPT [place] Applegate HMP and IFC at 80-150 Hz on 40% scan x 20 minutes in supine with LE's on wedge for comfort.   Patient tolerated treatment without complaint with normal modality response following removal of modality.     ASSESSMENT:  CLINICAL IMPRESSION: Good response after  last treatment.  She did well with today's RX as well with decreased soreness and pain after Rx. Left thoracic was particularly tender to palpation over paraspinals.    OBJECTIVE IMPAIRMENTS: decreased activity tolerance, increased muscle spasms, postural dysfunction, and pain.   ACTIVITY LIMITATIONS: carrying, lifting, and standing  PARTICIPATION LIMITATIONS: meal prep, cleaning, and laundry  REHAB POTENTIAL: Good  CLINICAL DECISION MAKING: Stable/uncomplicated  EVALUATION COMPLEXITY: Low   GOALS:  LONG TERM GOALS: Target date: 06/16/22  Ind with an HEP.  Goal status: partially met  2.  Lift grandson with pain not exceeding 2-3/10.  Goal status: On-going  3.  Perform ADL's with pain not exceeding 2-3/10.  Goal status: On-going  PLAN:  PT FREQUENCY: 2x/week  PT DURATION: 6 weeks  PLANNED  INTERVENTIONS: Therapeutic exercises, Therapeutic activity, Patient/Family education, Self Care, Dry Needling, Electrical stimulation, Cryotherapy, Moist heat, Ultrasound, and Manual therapy.  PLAN FOR NEXT SESSION: Combo e'stim/US, STW/M, postural exercises, scapular exercises.   Hobart Marte,[name], PTA 05/31/2022, 5:40 PM

## 2022-06-02 ENCOUNTER — Encounter: Payer: Self-pay | Admitting: *Deleted

## 2022-06-02 ENCOUNTER — Ambulatory Visit: Payer: Medicare PPO | Admitting: *Deleted

## 2022-06-02 DIAGNOSIS — M62830 Muscle spasm of back: Secondary | ICD-10-CM

## 2022-06-02 DIAGNOSIS — R293 Abnormal posture: Secondary | ICD-10-CM | POA: Diagnosis not present

## 2022-06-02 DIAGNOSIS — M546 Pain in thoracic spine: Secondary | ICD-10-CM

## 2022-06-02 NOTE — Therapy (Signed)
OUTPATIENT PHYSICAL THERAPY THORACIC TREATMENT   Patient Name: Theresa Reese MRN: [id number] DOB:1950/10/08, 72 y.o., female Today's Date: 06/02/2022  END OF SESSION:  PT End of Session - 06/02/22 1307     Visit Number 8    Number of Visits 12    Authorization Type FOTO.    PT Start Time 1300    PT Stop Time 1352    PT Time Calculation (min) 52 min              Past Medical History:  Diagnosis Date   GERD (gastroesophageal reflux disease)    Hypercholesteremia    Hypertension    Hypothyroid    Insomnia    Left knee DJD    Left shoulder pain    Lymphocytic colitis    Past Surgical History:  Procedure Laterality Date   BACK SURGERY     CESAREAN SECTION  1987   CESAREAN SECTION  1990   COLONOSCOPY     EYE SURGERY Bilateral    cataract   JOINT REPLACEMENT Left    KNEE ARTHROSCOPY W/ MENISCECTOMY Left 2014   Dr Tonita Cong   low back surgery  1997   Dr Rita Ohara   low back surgery  1994   Dr Rolin Barry   RETINAL DETACHMENT SURGERY Right 2013   SHOULDER ARTHROSCOPY WITH ROTATOR CUFF REPAIR Left 05/22/2019   Procedure: LEFT SHOULDER ARTHROSCOPY WITH ROTATOR CUFF REPAIR;  Surgeon: Hiram Gash, MD;  Location: [place];  Service: Orthopedics;  Laterality: Left;   SHOULDER ARTHROSCOPY WITH SUBACROMIAL DECOMPRESSION AND BICEP TENDON REPAIR Left 05/22/2019   Procedure: LEFT SHOULDER ARTHROSCOPY  DEBRIDEMENT WITH SUBACROMIAL DECOMPRESSION AND BICEP TENODESIS;  Surgeon: Hiram Gash, MD;  Location: [place];  Service: Orthopedics;  Laterality: Left;   TOTAL KNEE ARTHROPLASTY Left 09/09/2013   Procedure: LEFT TOTAL KNEE ARTHROPLASTY;  Surgeon: Lorn Junes, MD;  Location: [place];  Service: Orthopedics;  Laterality: Left;   Patient Active Problem List   Diagnosis Date Noted   DJD (degenerative joint disease) of knee 09/09/2013   Insomnia    Depression with anxiety    Hypothyroid    Lymphocytic colitis    Left knee DJD     REFERRING PROVIDER:  Cari Caraway MD  REFERRING DIAG: Pain in thoracic spine.  Rationale for Evaluation and Treatment: Rehabilitation  THERAPY DIAG:  Pain in thoracic spine  Muscle spasm of back  Abnormal posture  ONSET DATE: 12/22/2021  SUBJECTIVE:  SUBJECTIVE STATEMENT: Last treatment with helpful to mid-back.  Pain about a 4-5/10 ,but she has lifted her grandson today.   PERTINENT HISTORY:  HTN, osteopenia, left shoulder surgery, low back surgery and left knee surgery.  PAIN:  Are you having pain? Yes: NPRS scale: 4-5/10 Pain location: Mid-back, left > right. Pain description: As above. Aggravating factors: As above. Relieving factors: As above.  PRECAUTIONS: Other: Osteopenia.  WEIGHT BEARING RESTRICTIONS: No  FALLS:  Has patient fallen in last 6 months? No  LIVING ENVIRONMENT: Lives in: House/apartment Has following equipment at home: None  PLOF: Independent  PATIENT GOALS: Decrease pain.  Lift grandson without a lot less pain.   OBJECTIVE:   DIAGNOSTIC FINDINGS: X-ray IMPRESSION: 1. Osteopenia without evidence of thoracic spinal compression fracture. 2. Mild/moderate thoracic kyphosis and slight dextroscoliosis. 3. 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  PATIENT SURVEYS:  FOTO 42.   POSTURE: rounded shoulders, forward head, and increased thoracic kyphosis  PALPATION: Patient c/o palpable tenderness from ~T2 to T7 over left scapular musculature and to a lesser degree on the right.  UE ROM:  Bilateral UE AROM is WFL.  UE STRENGTH:  Normal bilateral UE strength.  DTR'S:  Normal bilateral UE DTR's.   GAIT: WNL.  TODAY'S TREATMENT:                                                                                                                               DATE:             06/02/22 UBE x 6 mins Prone over pillow on plinth with face in breath hole:  Combo e'stim/US at 1.50 W/CM2 x 12 minutes to patient upper to mid-thoracic region bilaterally   STW/M x 12 minutes  to Bil. Thoracic paras  HMP and IFC at 80-150 Hz on 40% scan x 20 minutes in supine with LE's on wedge for comfort.   Patient tolerated treatment without complaint with normal modality response following removal of modality.     ASSESSMENT:  CLINICAL IMPRESSION: Good response after  last treatment again.  She did well with today's RX as well with decreased soreness and pain after Rx. Left thoracic was less tender today, but  RT side had increased tenderness to palpation. Overall pt is improving.    OBJECTIVE IMPAIRMENTS: decreased activity tolerance, increased muscle spasms, postural dysfunction, and pain.   ACTIVITY LIMITATIONS: carrying, lifting, and standing  PARTICIPATION LIMITATIONS: meal prep, cleaning, and laundry  REHAB POTENTIAL: Good  CLINICAL DECISION MAKING: Stable/uncomplicated  EVALUATION COMPLEXITY: Low   GOALS:  LONG TERM GOALS: Target date: 06/16/22  Ind with an HEP.  Goal status: partially met  2.  Lift grandson with pain not exceeding 2-3/10.  Goal status: On-going  3.  Perform ADL's with pain not exceeding 2-3/10.  Goal status: On-going  PLAN:  PT FREQUENCY: 2x/week  PT DURATION: 6 weeks  PLANNED INTERVENTIONS: Therapeutic exercises, Therapeutic activity,  Patient/Family education, Self Care, Dry Needling, Electrical stimulation, Cryotherapy, Moist heat, Ultrasound, and Manual therapy.  PLAN FOR NEXT SESSION: Combo e'stim/US, STW/M, postural exercises, scapular exercises.   Elner Seifert,[name], PTA 06/02/2022, 1:53 PM

## 2022-06-07 ENCOUNTER — Encounter: Payer: Self-pay | Admitting: *Deleted

## 2022-06-07 ENCOUNTER — Ambulatory Visit: Payer: Medicare PPO | Admitting: *Deleted

## 2022-06-07 DIAGNOSIS — R293 Abnormal posture: Secondary | ICD-10-CM | POA: Diagnosis not present

## 2022-06-07 DIAGNOSIS — M62830 Muscle spasm of back: Secondary | ICD-10-CM | POA: Diagnosis not present

## 2022-06-07 DIAGNOSIS — M546 Pain in thoracic spine: Secondary | ICD-10-CM | POA: Diagnosis not present

## 2022-06-07 NOTE — Therapy (Signed)
OUTPATIENT PHYSICAL THERAPY THORACIC TREATMENT   Patient Name: Theresa Reese MRN: [id number] DOB:[birth year], 72 y.o., female Today's Date: 06/07/2022  END OF SESSION:  PT End of Session - 06/07/22 1814     Visit Number 9    Number of Visits 12    Authorization Type FOTO.    PT Start Time 1645    PT Stop Time 1745    PT Time Calculation (min) 60 min              Past Medical History:  Diagnosis Date   GERD (gastroesophageal reflux disease)    Hypercholesteremia    Hypertension    Hypothyroid    Insomnia    Left knee DJD    Left shoulder pain    Lymphocytic colitis    Past Surgical History:  Procedure Laterality Date   BACK SURGERY     CESAREAN SECTION  1987   CESAREAN SECTION  1990   COLONOSCOPY     EYE SURGERY Bilateral    cataract   JOINT REPLACEMENT Left    KNEE ARTHROSCOPY W/ MENISCECTOMY Left 2014   Dr Tonita Cong   low back surgery  1997   Dr Rita Ohara   low back surgery  1994   Dr Rolin Barry   RETINAL DETACHMENT SURGERY Right 2013   SHOULDER ARTHROSCOPY WITH ROTATOR CUFF REPAIR Left 05/22/2019   Procedure: LEFT SHOULDER ARTHROSCOPY WITH ROTATOR CUFF REPAIR;  Surgeon: Hiram Gash, MD;  Location: [place];  Service: Orthopedics;  Laterality: Left;   SHOULDER ARTHROSCOPY WITH SUBACROMIAL DECOMPRESSION AND BICEP TENDON REPAIR Left 05/22/2019   Procedure: LEFT SHOULDER ARTHROSCOPY  DEBRIDEMENT WITH SUBACROMIAL DECOMPRESSION AND BICEP TENODESIS;  Surgeon: Hiram Gash, MD;  Location: [place];  Service: Orthopedics;  Laterality: Left;   TOTAL KNEE ARTHROPLASTY Left 09/09/2013   Procedure: LEFT TOTAL KNEE ARTHROPLASTY;  Surgeon: Lorn Junes, MD;  Location: [place];  Service: Orthopedics;  Laterality: Left;   Patient Active Problem List   Diagnosis Date Noted   DJD (degenerative joint disease) of knee 09/09/2013   Insomnia    Depression with anxiety    Hypothyroid    Lymphocytic colitis    Left knee DJD     REFERRING PROVIDER:  Cari Caraway MD  REFERRING DIAG: Pain in thoracic spine.  Rationale for Evaluation and Treatment: Rehabilitation  THERAPY DIAG:  Muscle spasm of back  Abnormal posture  Pain in thoracic spine  ONSET DATE: 12/22/2021  SUBJECTIVE:  SUBJECTIVE STATEMENT: Last treatment with helpful to mid-back.  Pain about a 4-5/10 ,but she has lifted her grandson today.   PERTINENT HISTORY:  HTN, osteopenia, left shoulder surgery, low back surgery and left knee surgery.  PAIN:  Are you having pain? Yes: NPRS scale: 4-5/10 Pain location: Mid-back, left > right. Pain description: As above. Aggravating factors: As above. Relieving factors: As above.  PRECAUTIONS: Other: Osteopenia.  WEIGHT BEARING RESTRICTIONS: No  FALLS:  Has patient fallen in last 6 months? No  LIVING ENVIRONMENT: Lives in: House/apartment Has following equipment at home: None  PLOF: Independent  PATIENT GOALS: Decrease pain.  Lift grandson without a lot less pain.   OBJECTIVE:   DIAGNOSTIC FINDINGS: X-ray IMPRESSION: 1. Osteopenia without evidence of thoracic spinal compression fracture. 2. Mild/moderate thoracic kyphosis and slight dextroscoliosis. 3. 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  PATIENT SURVEYS:  FOTO 42.   POSTURE: rounded shoulders, forward head, and increased thoracic kyphosis  PALPATION: Patient c/o palpable tenderness from ~T2 to T7 over left scapular musculature and to a lesser degree on the right.  UE ROM:  Bilateral UE AROM is WFL.  UE STRENGTH:  Normal bilateral UE strength.  DTR'S:  Normal bilateral UE DTR's.   GAIT: WNL.  TODAY'S TREATMENT:                                                                                                                               DATE:             06/02/22 UBE x 8 mins Prone over pillow on plinth with face in breath hole:  Combo e'stim/US at 1.50 W/CM2 x 10 minutes to patient upper to mid-thoracic region LT side as well as 8 mins LT UT and cerv. paras   STW/M x 12 minutes  to Bil. Thoracic paras  HMP and IFC at 80-150 Hz on 40% scan x 15 minutes in sitting   Patient tolerated treatment without complaint with normal modality response following removal of modality.     ASSESSMENT:  CLINICAL IMPRESSION: Good response after  last treatment again.  Pt was mainly hurting on the LT side thoracic paras today as well as UT and cervical paras LT side. Pt with notable mm tension/ soreness thoracic paras as well as Tp's in cervical paras with good release. Improved ROM end of session. Overall pt is improving.    OBJECTIVE IMPAIRMENTS: decreased activity tolerance, increased muscle spasms, postural dysfunction, and pain.   ACTIVITY LIMITATIONS: carrying, lifting, and standing  PARTICIPATION LIMITATIONS: meal prep, cleaning, and laundry  REHAB POTENTIAL: Good  CLINICAL DECISION MAKING: Stable/uncomplicated  EVALUATION COMPLEXITY: Low   GOALS:  LONG TERM GOALS: Target date: 06/16/22  Ind with an HEP.  Goal status: partially met  2.  Lift grandson with pain not exceeding 2-3/10.  Goal status: On-going  3.  Perform ADL's with pain not exceeding 2-3/10.  Goal status: On-going  PLAN:  PT FREQUENCY: 2x/week  PT DURATION: 6 weeks  PLANNED INTERVENTIONS: Therapeutic exercises, Therapeutic activity, Patient/Family education, Self Care, Dry Needling, Electrical stimulation, Cryotherapy, Moist heat, Ultrasound, and Manual therapy.  PLAN FOR NEXT SESSION: Combo e'stim/US, STW/M, postural exercises, scapular exercises.   Grayce Budden,[name], PTA 06/07/2022, 6:23 PM

## 2022-06-09 ENCOUNTER — Encounter: Payer: Self-pay | Admitting: *Deleted

## 2022-06-09 ENCOUNTER — Ambulatory Visit: Payer: Medicare PPO | Admitting: *Deleted

## 2022-06-09 DIAGNOSIS — M62830 Muscle spasm of back: Secondary | ICD-10-CM

## 2022-06-09 DIAGNOSIS — R293 Abnormal posture: Secondary | ICD-10-CM

## 2022-06-09 DIAGNOSIS — M546 Pain in thoracic spine: Secondary | ICD-10-CM | POA: Diagnosis not present

## 2022-06-09 NOTE — Therapy (Signed)
OUTPATIENT PHYSICAL THERAPY THORACIC TREATMENT   Patient Name: Theresa Reese MRN: [id number] DOB:1950/09/25, 72 y.o., female Today's Date: 06/09/2022  END OF SESSION:  PT End of Session - 06/09/22 1037     Visit Number 10    Number of Visits 12    Date for PT Re-Evaluation 06/16/22    Authorization Type FOTO.    PT Start Time 1030    PT Stop Time 1128    PT Time Calculation (min) 58 min              Past Medical History:  Diagnosis Date   GERD (gastroesophageal reflux disease)    Hypercholesteremia    Hypertension    Hypothyroid    Insomnia    Left knee DJD    Left shoulder pain    Lymphocytic colitis    Past Surgical History:  Procedure Laterality Date   BACK SURGERY     CESAREAN SECTION  1987   CESAREAN SECTION  1990   COLONOSCOPY     EYE SURGERY Bilateral    cataract   JOINT REPLACEMENT Left    KNEE ARTHROSCOPY W/ MENISCECTOMY Left 2014   Dr Tonita Cong   low back surgery  1997   Dr Rita Ohara   low back surgery  1994   Dr Rolin Barry   RETINAL DETACHMENT SURGERY Right 2013   SHOULDER ARTHROSCOPY WITH ROTATOR CUFF REPAIR Left 05/22/2019   Procedure: LEFT SHOULDER ARTHROSCOPY WITH ROTATOR CUFF REPAIR;  Surgeon: Hiram Gash, MD;  Location: [place];  Service: Orthopedics;  Laterality: Left;   SHOULDER ARTHROSCOPY WITH SUBACROMIAL DECOMPRESSION AND BICEP TENDON REPAIR Left 05/22/2019   Procedure: LEFT SHOULDER ARTHROSCOPY  DEBRIDEMENT WITH SUBACROMIAL DECOMPRESSION AND BICEP TENODESIS;  Surgeon: Hiram Gash, MD;  Location: [place];  Service: Orthopedics;  Laterality: Left;   TOTAL KNEE ARTHROPLASTY Left 09/09/2013   Procedure: LEFT TOTAL KNEE ARTHROPLASTY;  Surgeon: Lorn Junes, MD;  Location: [place];  Service: Orthopedics;  Laterality: Left;   Patient Active Problem List   Diagnosis Date Noted   DJD (degenerative joint disease) of knee 09/09/2013   Insomnia    Depression with anxiety    Hypothyroid    Lymphocytic colitis     Left knee DJD     REFERRING PROVIDER: Cari Caraway MD  REFERRING DIAG: Pain in thoracic spine.  Rationale for Evaluation and Treatment: Rehabilitation  THERAPY DIAG:  Muscle spasm of back  Abnormal posture  ONSET DATE: 12/22/2021  SUBJECTIVE:  SUBJECTIVE STATEMENT: Last treatment with helpful to mid-back.  Pain about a 4-5/10 ,but she has lifted her grandson today.   PERTINENT HISTORY:  HTN, osteopenia, left shoulder surgery, low back surgery and left knee surgery.  PAIN:  Are you having pain? Yes: NPRS scale: 4-5/10 Pain location: Mid-back, left > right. Pain description: As above. Aggravating factors: As above. Relieving factors: As above.  PRECAUTIONS: Other: Osteopenia.  WEIGHT BEARING RESTRICTIONS: No  FALLS:  Has patient fallen in last 6 months? No  LIVING ENVIRONMENT: Lives in: House/apartment Has following equipment at home: None  PLOF: Independent  PATIENT GOALS: Decrease pain.  Lift grandson without a lot less pain.   OBJECTIVE:   DIAGNOSTIC FINDINGS: X-ray IMPRESSION: 1. Osteopenia without evidence of thoracic spinal compression fracture. 2. Mild/moderate thoracic kyphosis and slight dextroscoliosis. 3. 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  PATIENT SURVEYS:  FOTO 42.   POSTURE: rounded shoulders, forward head, and increased thoracic kyphosis  PALPATION: Patient c/o palpable tenderness from ~T2 to T7 over left scapular musculature and to a lesser degree on the right.  UE ROM:  Bilateral UE AROM is WFL.  UE STRENGTH:  Normal bilateral UE strength.  DTR'S:  Normal bilateral UE DTR's.   GAIT: WNL.  TODAY'S TREATMENT:                                                                                                                               DATE:             06/02/22 UBE x 8 mins Prone over pillow on plinth with face in breath hole:  Combo e'stim/US at 1.50 W/CM2 x 10 minutes to patient upper to mid-thoracic region LT side as well as 8 mins LT UT and cerv. paras   STW/M x 12 minutes  to Bil. Thoracic paras  HMP and IFC at 80-150 Hz on 40% scan x 15 minutes in sitting   Patient tolerated treatment without complaint with normal modality response following removal of modality.     ASSESSMENT:  CLINICAL IMPRESSION: Good response after  last treatment again.  Pt was mainly hurting on the LT side thoracic paras today again  as well as UT and cervical paras LT side. Pt with notable mm tension/ soreness thoracic paras , but less than last visit.Notable trigger points along cervical paras LT side.. Improved ROM end of session. Overall pt is improving. Pt wants to try DN next Rx    OBJECTIVE IMPAIRMENTS: decreased activity tolerance, increased muscle spasms, postural dysfunction, and pain.   ACTIVITY LIMITATIONS: carrying, lifting, and standing  PARTICIPATION LIMITATIONS: meal prep, cleaning, and laundry  REHAB POTENTIAL: Good  CLINICAL DECISION MAKING: Stable/uncomplicated  EVALUATION COMPLEXITY: Low   GOALS:  LONG TERM GOALS: Target date: 06/16/22  Ind with an HEP.  Goal status: partially met  2.  Lift grandson with pain not exceeding 2-3/10.  Goal status: On-going  3.  Perform ADL's with pain not exceeding 2-3/10.  Goal status: On-going  PLAN:  PT FREQUENCY: 2x/week  PT DURATION: 6 weeks  PLANNED INTERVENTIONS: Therapeutic exercises, Therapeutic activity, Patient/Family education, Self Care, Dry Needling, Electrical stimulation, Cryotherapy, Moist heat, Ultrasound, and Manual therapy.  PLAN FOR NEXT SESSION: Combo e'stim/US, STW/M, postural exercises, scapular exercises.   Steffi Noviello,[name], PTA 06/09/2022, 3:02 PM

## 2022-06-14 ENCOUNTER — Ambulatory Visit: Payer: Medicare PPO | Admitting: Physical Therapy

## 2022-06-14 DIAGNOSIS — M62830 Muscle spasm of back: Secondary | ICD-10-CM

## 2022-06-14 DIAGNOSIS — R293 Abnormal posture: Secondary | ICD-10-CM

## 2022-06-14 DIAGNOSIS — M546 Pain in thoracic spine: Secondary | ICD-10-CM | POA: Diagnosis not present

## 2022-06-14 NOTE — Therapy (Signed)
OUTPATIENT PHYSICAL THERAPY THORACIC TREATMENT   Patient Name: Theresa Reese MRN: [id number] DOB:06/20/1950, 72 y.o., female Today's Date: 06/14/2022  END OF SESSION:  PT End of Session - 06/14/22 1743     Visit Number 11    Number of Visits 12    Date for PT Re-Evaluation 06/16/22    Authorization Type FOTO.    PT Start Time 0445    PT Stop Time 0540    PT Time Calculation (min) 55 min    Activity Tolerance Patient tolerated treatment well    Behavior During Therapy WFL for tasks assessed/performed              Past Medical History:  Diagnosis Date   GERD (gastroesophageal reflux disease)    Hypercholesteremia    Hypertension    Hypothyroid    Insomnia    Left knee DJD    Left shoulder pain    Lymphocytic colitis    Past Surgical History:  Procedure Laterality Date   BACK SURGERY     CESAREAN [id number]   COLONOSCOPY     EYE SURGERY Bilateral    cataract   JOINT REPLACEMENT Left    KNEE ARTHROSCOPY W/ MENISCECTOMY Left 2014   Dr Tonita Cong   low back surgery  1997   Dr Rita Ohara   low back surgery  1994   Dr Rolin Barry   RETINAL DETACHMENT SURGERY Right 2013   SHOULDER ARTHROSCOPY WITH ROTATOR CUFF REPAIR Left 05/22/2019   Procedure: LEFT SHOULDER ARTHROSCOPY WITH ROTATOR CUFF REPAIR;  Surgeon: Hiram Gash, MD;  Location: [place];  Service: Orthopedics;  Laterality: Left;   SHOULDER ARTHROSCOPY WITH SUBACROMIAL DECOMPRESSION AND BICEP TENDON REPAIR Left 05/22/2019   Procedure: LEFT SHOULDER ARTHROSCOPY  DEBRIDEMENT WITH SUBACROMIAL DECOMPRESSION AND BICEP TENODESIS;  Surgeon: Hiram Gash, MD;  Location: [place];  Service: Orthopedics;  Laterality: Left;   TOTAL KNEE ARTHROPLASTY Left 09/09/2013   Procedure: LEFT TOTAL KNEE ARTHROPLASTY;  Surgeon: Lorn Junes, MD;  Location: [place];  Service: Orthopedics;  Laterality: Left;   Patient Active Problem List   Diagnosis Date Noted   DJD (degenerative  joint disease) of knee 09/09/2013   Insomnia    Depression with anxiety    Hypothyroid    Lymphocytic colitis    Left knee DJD     REFERRING PROVIDER: Cari Caraway MD  REFERRING DIAG: Pain in thoracic spine.  Rationale for Evaluation and Treatment: Rehabilitation  THERAPY DIAG:  No diagnosis found.  ONSET DATE: 12/22/2021  SUBJECTIVE:  SUBJECTIVE STATEMENT: CC is pain over left cervical paraspinals and left UT.  PERTINENT HISTORY:  HTN, osteopenia, left shoulder surgery, low back surgery and left knee surgery.  PAIN:  Are you having pain? Yes: NPRS scale: 4/10 Pain location: Mid-back, left > right. Pain description: As above. Aggravating factors: As above. Relieving factors: As above.  PRECAUTIONS: Other: Osteopenia.  WEIGHT BEARING RESTRICTIONS: No  FALLS:  Has patient fallen in last 6 months? No  LIVING ENVIRONMENT: Lives in: House/apartment Has following equipment at home: None  PLOF: Independent  PATIENT GOALS: Decrease pain.  Lift grandson without a lot less pain.   OBJECTIVE:   DIAGNOSTIC FINDINGS: X-ray IMPRESSION: 1. Osteopenia without evidence of thoracic spinal compression fracture. 2. Mild/moderate thoracic kyphosis and slight dextroscoliosis. 3. 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  PATIENT SURVEYS:  FOTO 42.   POSTURE: rounded shoulders, forward head, and increased thoracic kyphosis  PALPATION: Patient c/o palpable tenderness from ~T2 to T7 over left scapular musculature and to a lesser degree on the right.  UE ROM:  Bilateral UE AROM is WFL.  UE STRENGTH:  Normal bilateral UE strength.  DTR'S:  Normal bilateral UE DTR's.   GAIT: WNL.  TODAY'S TREATMENT:                                                                                                                               DATE:             06/14/22  Trigger Point Dry-Needling  Treatment instructions: Expect mild to moderate muscle soreness. S/S of pneumothorax if dry needled over a lung field, and to seek immediate medical attention should they occur. Patient verbalized understanding of these instructions and education.  Patient Consent Given: Yes Education handout provided: Yes Muscles treated: Left UT and Splenius Capitus/Cervicus Treatment response/outcome: Twitch response.  F/b combo e'stim/US at 1.50 W.CM2 x 12 minutes f/b STW/M x 11 minutes f/b    HMP and IFC at 80-150 Hz on 40% scan x 20 minutes in sitting   Patient tolerated treatment without complaint with normal modality response following removal of modality.     ASSESSMENT:  CLINICAL IMPRESSION: Excellent response of left UT with dry needling today.  Patient felt better after treatment with less tone in left UT after treatment.  OBJECTIVE IMPAIRMENTS: decreased activity tolerance, increased muscle spasms, postural dysfunction, and pain.   ACTIVITY LIMITATIONS: carrying, lifting, and standing  PARTICIPATION LIMITATIONS: meal prep, cleaning, and laundry  REHAB POTENTIAL: Good  CLINICAL DECISION MAKING: Stable/uncomplicated  EVALUATION COMPLEXITY: Low   GOALS:  LONG TERM GOALS: Target date: 06/16/22  Ind with an HEP.  Goal status: partially met  2.  Lift grandson with pain not exceeding 2-3/10.  Goal status: On-going  3.  Perform ADL's with pain not exceeding 2-3/10.  Goal status: On-going  PLAN:  PT FREQUENCY: 2x/week  PT DURATION: 6 weeks  PLANNED INTERVENTIONS: Therapeutic exercises, Therapeutic  activity, Patient/Family education, Self Care, Dry Needling, Electrical stimulation, Cryotherapy, Moist heat, Ultrasound, and Manual therapy.  PLAN FOR NEXT SESSION: Combo e'stim/US, STW/M, postural exercises, scapular  exercises.   Dewana Ammirati, [place], PT 06/14/2022, 5:52 PM

## 2022-06-16 ENCOUNTER — Encounter: Payer: Self-pay | Admitting: *Deleted

## 2022-06-16 ENCOUNTER — Ambulatory Visit: Payer: Medicare PPO | Admitting: *Deleted

## 2022-06-16 DIAGNOSIS — M546 Pain in thoracic spine: Secondary | ICD-10-CM

## 2022-06-16 DIAGNOSIS — M62830 Muscle spasm of back: Secondary | ICD-10-CM

## 2022-06-16 DIAGNOSIS — R293 Abnormal posture: Secondary | ICD-10-CM | POA: Diagnosis not present

## 2022-06-16 NOTE — Therapy (Signed)
OUTPATIENT PHYSICAL THERAPY THORACIC TREATMENT   Patient Name: Theresa Reese MRN: [id number] DOB:11-11-1950, 72 y.o., female Today's Date: 06/16/2022  END OF SESSION:  PT End of Session - 06/16/22 1257     Visit Number 12    Number of Visits 12    Date for PT Re-Evaluation 06/16/22    Authorization Type FOTO.    PT Start Time 1300    PT Stop Time 1352    PT Time Calculation (min) 52 min              Past Medical History:  Diagnosis Date   GERD (gastroesophageal reflux disease)    Hypercholesteremia    Hypertension    Hypothyroid    Insomnia    Left knee DJD    Left shoulder pain    Lymphocytic colitis    Past Surgical History:  Procedure Laterality Date   BACK SURGERY     CESAREAN SECTION  1987   CESAREAN SECTION  1990   COLONOSCOPY     EYE SURGERY Bilateral    cataract   JOINT REPLACEMENT Left    KNEE ARTHROSCOPY W/ MENISCECTOMY Left 2014   Dr Tonita Cong   low back surgery  1997   Dr Rita Ohara   low back surgery  1994   Dr Rolin Barry   RETINAL DETACHMENT SURGERY Right 2013   SHOULDER ARTHROSCOPY WITH ROTATOR CUFF REPAIR Left 05/22/2019   Procedure: LEFT SHOULDER ARTHROSCOPY WITH ROTATOR CUFF REPAIR;  Surgeon: Hiram Gash, MD;  Location: [place] [facility];  Service: Orthopedics;  Laterality: Left;   SHOULDER ARTHROSCOPY WITH SUBACROMIAL DECOMPRESSION AND BICEP TENDON REPAIR Left 05/22/2019   Procedure: LEFT SHOULDER ARTHROSCOPY  DEBRIDEMENT WITH SUBACROMIAL DECOMPRESSION AND BICEP TENODESIS;  Surgeon: Hiram Gash, MD;  Location: [place];  Service: Orthopedics;  Laterality: Left;   TOTAL KNEE ARTHROPLASTY Left 09/09/2013   Procedure: LEFT TOTAL KNEE ARTHROPLASTY;  Surgeon: Lorn Junes, MD;  Location: [place];  Service: Orthopedics;  Laterality: Left;   Patient Active Problem List   Diagnosis Date Noted   DJD (degenerative joint disease) of knee 09/09/2013   Insomnia    Depression with anxiety    Hypothyroid    Lymphocytic colitis     Left knee DJD     REFERRING PROVIDER: Cari Caraway MD  REFERRING DIAG: Pain in thoracic spine.  Rationale for Evaluation and Treatment: Rehabilitation  THERAPY DIAG:  Muscle spasm of back  Abnormal posture  Pain in thoracic spine  ONSET DATE: 12/22/2021  SUBJECTIVE:  SUBJECTIVE STATEMENT: doing 50% over all. RT side    PERTINENT HISTORY:  HTN, osteopenia, left shoulder surgery, low back surgery and left knee surgery.  PAIN:  Are you having pain? Yes: NPRS scale: 4/10 Pain location: Mid-back, left > right. Pain description: As above. Aggravating factors: As above. Relieving factors: As above.  PRECAUTIONS: Other: Osteopenia.  WEIGHT BEARING RESTRICTIONS: No  FALLS:  Has patient fallen in last 6 months? No  LIVING ENVIRONMENT: Lives in: House/apartment Has following equipment at home: None  PLOF: Independent  PATIENT GOALS: Decrease pain.  Lift grandson without a lot less pain.   OBJECTIVE:   DIAGNOSTIC FINDINGS: X-ray IMPRESSION: 1. Osteopenia without evidence of thoracic spinal compression fracture. 2. Mild/moderate thoracic kyphosis and slight dextroscoliosis. 3. 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  PATIENT SURVEYS:  FOTO 42.   POSTURE: rounded shoulders, forward head, and increased thoracic kyphosis  PALPATION: Patient c/o palpable tenderness from ~T2 to T7 over left scapular musculature and to a lesser degree on the right.  UE ROM:  Bilateral UE AROM is WFL.  UE STRENGTH:  Normal bilateral UE strength.  DTR'S:  Normal bilateral UE DTR's.   GAIT: WNL.  TODAY'S TREATMENT:                                                                                                                              DATE:                                                                                   06/16/22   Combo e'stim/US at 1.50 W.CM2 x 12 minutes  to LT UT and cerv paras STW/M x 12 minutes  to LT UT and   cerv. paras  HMP and IFC at 80-150 Hz on 40% scan x 20 minutes in sitting   Patient tolerated treatment without complaint with normal modality response following removal of modality.     ASSESSMENT:  CLINICAL IMPRESSION:  Pt arrived today doing well after DN and did well, but very sore. Good response after today's Rx with less soreness. Pt feels 50% better overall. Mainly tightness on the LT side now    OBJECTIVE IMPAIRMENTS: decreased activity tolerance, increased muscle spasms, postural dysfunction, and pain.   ACTIVITY LIMITATIONS: carrying, lifting, and standing  PARTICIPATION LIMITATIONS: meal prep, cleaning, and laundry  REHAB POTENTIAL: Good  CLINICAL DECISION MAKING: Stable/uncomplicated  EVALUATION COMPLEXITY: Low   GOALS:  LONG TERM GOALS: Target date: 06/16/22  Ind with an HEP.  Goal status: partially met  2.  Lift grandson with pain not exceeding 2-3/10.  Goal status:  On-going  3.  Perform ADL's with pain not exceeding 2-3/10.  Goal status: On-going  PLAN:  PT FREQUENCY: 2x/week  PT DURATION: 6 weeks  PLANNED INTERVENTIONS: Therapeutic exercises, Therapeutic activity, Patient/Family education, Self Care, Dry Needling, Electrical stimulation, Cryotherapy, Moist heat, Ultrasound, and Manual therapy.  PLAN FOR NEXT SESSION: Combo e'stim/US, STW/M, postural exercises, scapular exercises.   Yazlyn Wentzel,[name], PTA 06/16/2022, 1:53 PM

## 2022-06-16 NOTE — Addendum Note (Signed)
Addended by: Taym Twist, [place] W on: 06/16/2022 04:25 PM   Modules accepted: Orders

## 2022-06-20 ENCOUNTER — Ambulatory Visit: Payer: Medicare PPO | Admitting: Physical Therapy

## 2022-06-23 ENCOUNTER — Ambulatory Visit: Payer: Medicare PPO | Admitting: Physical Therapy

## 2022-06-30 ENCOUNTER — Ambulatory Visit: Payer: Medicare PPO | Attending: Family Medicine | Admitting: Physical Therapy

## 2022-06-30 ENCOUNTER — Encounter: Payer: Self-pay | Admitting: Physical Therapy

## 2022-06-30 DIAGNOSIS — R293 Abnormal posture: Secondary | ICD-10-CM | POA: Diagnosis not present

## 2022-06-30 NOTE — Therapy (Signed)
OUTPATIENT PHYSICAL THERAPY THORACIC TREATMENT   Patient Name: Theresa Reese MRN: [id number] DOB:Jun 16, 1950, 72 y.o., female Today's Date: 06/30/2022  END OF SESSION:  PT End of Session - 06/30/22 1731     Visit Number 12    Number of Visits 18    Date for PT Re-Evaluation 07/21/22    Authorization Type FOTO.    PT Start Time 0445    PT Stop Time 0547    PT Time Calculation (min) 62 min    Activity Tolerance Patient tolerated treatment well    Behavior During Therapy WFL for tasks assessed/performed              Past Medical History:  Diagnosis Date   GERD (gastroesophageal reflux disease)    Hypercholesteremia    Hypertension    Hypothyroid    Insomnia    Left knee DJD    Left shoulder pain    Lymphocytic colitis    Past Surgical History:  Procedure Laterality Date   BACK SURGERY     CESAREAN SECTION  1987   CESAREAN SECTION  1990   COLONOSCOPY     EYE SURGERY Bilateral    cataract   JOINT REPLACEMENT Left    KNEE ARTHROSCOPY W/ MENISCECTOMY Left 2014   Dr Shelle IronBeane   low back surgery  1997   Dr Jule SerNudleman   low back surgery  1994   Dr Elesa Hackereaton   RETINAL DETACHMENT SURGERY Right 2013   SHOULDER ARTHROSCOPY WITH ROTATOR CUFF REPAIR Left 05/22/2019   Procedure: LEFT SHOULDER ARTHROSCOPY WITH ROTATOR CUFF REPAIR;  Surgeon: Bjorn PippinVarkey, Dax T, MD;  Location: [facility];  Service: Orthopedics;  Laterality: Left;   SHOULDER ARTHROSCOPY WITH SUBACROMIAL DECOMPRESSION AND BICEP TENDON REPAIR Left 05/22/2019   Procedure: LEFT SHOULDER ARTHROSCOPY  DEBRIDEMENT WITH SUBACROMIAL DECOMPRESSION AND BICEP TENODESIS;  Surgeon: Bjorn PippinVarkey, Dax T, MD;  Location: [facility];  Service: Orthopedics;  Laterality: Left;   TOTAL KNEE ARTHROPLASTY Left 09/09/2013   Procedure: LEFT TOTAL KNEE ARTHROPLASTY;  Surgeon: Nilda Simmerobert A Wainer, MD;  Location[facility];  Service: Orthopedics;  Laterality: Left;   Patient Active Problem List   Diagnosis Date Noted   DJD (degenerative  joint disease) of knee 09/09/2013   Insomnia    Depression with anxiety    Hypothyroid    Lymphocytic colitis    Left knee DJD     REFERRING PROVIDER: Gweneth DimitriWendy McNeill MD  REFERRING DIAG: Pain in thoracic spine.  Rationale for Evaluation and Treatment: Rehabilitation  THERAPY DIAG:  Abnormal posture - Plan: PT plan of care cert/re-cert  ONSET DATE: 12/22/2021  SUBJECTIVE:  SUBJECTIVE STATEMENT: Doing much better.  No c/o thoracic pain.  Pain over bilateral UT's but less frequent.  PERTINENT HISTORY:  HTN, osteopenia, left shoulder surgery, low back surgery and left knee surgery.  PAIN:  Are you having pain? Yes: NPRS scale: 4/10 Pain location: Mid-back, left > right. Pain description: As above. Aggravating factors: As above. Relieving factors: As above.  PRECAUTIONS: Other: Osteopenia.  WEIGHT BEARING RESTRICTIONS: No  FALLS:  Has patient fallen in last 6 months? No  LIVING ENVIRONMENT: Lives in: House/apartment Has following equipment at home: None  PLOF: Independent  PATIENT GOALS: Decrease pain.  Lift grandson without a lot less pain.   OBJECTIVE:   DIAGNOSTIC FINDINGS: X-ray IMPRESSION: 1. Osteopenia without evidence of thoracic spinal compression fracture. 2. Mild/moderate thoracic kyphosis and slight dextroscoliosis. 3. 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  PATIENT SURVEYS:  FOTO 42.   POSTURE: rounded shoulders, forward head, and increased thoracic kyphosis  PALPATION: Patient c/o palpable tenderness from ~T2 to T7 over left scapular musculature and to a lesser degree on the right.  UE ROM:  Bilateral UE AROM is WFL.  UE STRENGTH:  Normal bilateral UE strength.  DTR'S:  Normal bilateral UE  DTR's.   GAIT: WNL.  TODAY'S TREATMENT:                                                                                                                              DATE:    UBE x 10 minutes                                                                              06/30/22   Combo e'stim/US at 1.50 W.CM2 x 10 minutes  to bil UT's STW/M x 12 minutes  to bil UT's with ischemic release technique utilized. HMP and IFC at 80-150 Hz on 40% scan x 20 minutes in sitting   Patient tolerated treatment without complaint with normal modality response following removal of modality.     ASSESSMENT:  CLINICAL IMPRESSION:  Patient pleased with her progress.  No c/o thoracic pain.  Pain over bilateral UT's but less frequent.  OBJECTIVE IMPAIRMENTS: decreased activity tolerance, increased muscle spasms, postural dysfunction, and pain.   ACTIVITY LIMITATIONS: carrying, lifting, and standing  PARTICIPATION LIMITATIONS: meal prep, cleaning, and laundry  REHAB POTENTIAL: Good  CLINICAL DECISION MAKING: Stable/uncomplicated  EVALUATION COMPLEXITY: Low   GOALS:  LONG TERM GOALS: Target date: 06/16/22  Ind with an HEP.  Goal status: partially met  2.  Lift grandson with pain not exceeding 2-3/10.  Goal status: On-going  3.  Perform ADL's with pain not exceeding  2-3/10.  Goal status: On-going  PLAN:  PT FREQUENCY: 2x/week  PT DURATION: 6 weeks  PLANNED INTERVENTIONS: Therapeutic exercises, Therapeutic activity, Patient/Family education, Self Care, Dry Needling, Electrical stimulation, Cryotherapy, Moist heat, Ultrasound, and Manual therapy.  PLAN FOR NEXT SESSION: Combo e'stim/US, STW/M, postural exercises, scapular exercises.   [name], [place], PT 06/30/2022, 6:05 PM

## 2022-07-14 ENCOUNTER — Ambulatory Visit: Payer: Medicare PPO | Admitting: Physical Therapy

## 2022-07-15 DIAGNOSIS — E039 Hypothyroidism, unspecified: Secondary | ICD-10-CM | POA: Diagnosis not present

## 2022-07-21 ENCOUNTER — Encounter: Payer: Medicare PPO | Admitting: *Deleted

## 2022-07-21 DIAGNOSIS — B9689 Other specified bacterial agents as the cause of diseases classified elsewhere: Secondary | ICD-10-CM | POA: Diagnosis not present

## 2022-07-21 DIAGNOSIS — Z6824 Body mass index (BMI) 24.0-24.9, adult: Secondary | ICD-10-CM | POA: Diagnosis not present

## 2022-07-21 DIAGNOSIS — J329 Chronic sinusitis, unspecified: Secondary | ICD-10-CM | POA: Diagnosis not present

## 2022-07-21 DIAGNOSIS — R000 Tachycardia, unspecified: Secondary | ICD-10-CM | POA: Diagnosis not present

## 2022-07-21 DIAGNOSIS — J45901 Unspecified asthma with (acute) exacerbation: Secondary | ICD-10-CM | POA: Diagnosis not present

## 2022-07-21 DIAGNOSIS — E039 Hypothyroidism, unspecified: Secondary | ICD-10-CM | POA: Diagnosis not present

## 2022-07-28 ENCOUNTER — Ambulatory Visit: Payer: Medicare PPO | Attending: Family Medicine | Admitting: *Deleted

## 2022-07-28 ENCOUNTER — Encounter: Payer: Self-pay | Admitting: *Deleted

## 2022-07-28 DIAGNOSIS — R293 Abnormal posture: Secondary | ICD-10-CM

## 2022-07-28 DIAGNOSIS — M62830 Muscle spasm of back: Secondary | ICD-10-CM | POA: Diagnosis not present

## 2022-07-28 DIAGNOSIS — M546 Pain in thoracic spine: Secondary | ICD-10-CM | POA: Insufficient documentation

## 2022-07-28 NOTE — Therapy (Addendum)
OUTPATIENT PHYSICAL THERAPY THORACIC TREATMENT   Patient Name: Theresa Reese MRN: [id number] DOB:08-17-1950, 72 y.o., female Today's Date: 07/28/2022  END OF SESSION:  PT End of Session - 07/28/22 1257     Visit Number 13    Number of Visits 18    Date for PT Re-Evaluation 07/21/22    Authorization Type FOTO.    PT Start Time 1300    PT Stop Time 1350    PT Time Calculation (min) 50 min              Past Medical History:  Diagnosis Date   GERD (gastroesophageal reflux disease)    Hypercholesteremia    Hypertension    Hypothyroid    Insomnia    Left knee DJD    Left shoulder pain    Lymphocytic colitis    Past Surgical History:  Procedure Laterality Date   BACK SURGERY     CESAREAN SECTION  1987   CESAREAN SECTION  1990   COLONOSCOPY     EYE SURGERY Bilateral    cataract   JOINT REPLACEMENT Left    KNEE ARTHROSCOPY W/ MENISCECTOMY Left 2014   Dr Shelle Iron   low back surgery  1997   Dr Jule Ser   low back surgery  1994   Dr Elesa Hacker   RETINAL DETACHMENT SURGERY Right 2013   SHOULDER ARTHROSCOPY WITH ROTATOR CUFF REPAIR Left 05/22/2019   Procedure: LEFT SHOULDER ARTHROSCOPY WITH ROTATOR CUFF REPAIR;  Surgeon: Bjorn Pippin, MD;  Location: [facility];  Service: Orthopedics;  Laterality: Left;   SHOULDER ARTHROSCOPY WITH SUBACROMIAL DECOMPRESSION AND BICEP TENDON REPAIR Left 05/22/2019   Procedure: LEFT SHOULDER ARTHROSCOPY  DEBRIDEMENT WITH SUBACROMIAL DECOMPRESSION AND BICEP TENODESIS;  Surgeon: Bjorn Pippin, MD;  Location: [facility];  Service: Orthopedics;  Laterality: Left;   TOTAL KNEE ARTHROPLASTY Left 09/09/2013   Procedure: LEFT TOTAL KNEE ARTHROPLASTY;  Surgeon: Nilda Simmer, MD;  Location: [facility];  Service: Orthopedics;  Laterality: Left;   Patient Active Problem List   Diagnosis Date Noted   DJD (degenerative joint disease) of knee 09/09/2013   Insomnia    Depression with anxiety    Hypothyroid    Lymphocytic colitis    Left  knee DJD     REFERRING PROVIDER: Gweneth Dimitri MD  REFERRING DIAG: Pain in thoracic spine.  Rationale for Evaluation and Treatment: Rehabilitation  THERAPY DIAG:  Abnormal posture  Muscle spasm of back  Pain in thoracic spine  ONSET DATE: 12/22/2021  SUBJECTIVE:  SUBJECTIVE STATEMENT: Doing much better.  No c/o thoracic pain. Today mainly LT side at UT and base of neck 4/10  PERTINENT HISTORY:  HTN, osteopenia, left shoulder surgery, low back surgery and left knee surgery.  PAIN:  Are you having pain? Yes: NPRS scale: 4/10 Pain location: Mid-back, left > right. Pain description: As above. Aggravating factors: As above. Relieving factors: As above.  PRECAUTIONS: Other: Osteopenia.  WEIGHT BEARING RESTRICTIONS: No  FALLS:  Has patient fallen in last 6 months? No  LIVING ENVIRONMENT: Lives in: House/apartment Has following equipment at home: None  PLOF: Independent  PATIENT GOALS: Decrease pain.  Lift grandson without a lot less pain.   OBJECTIVE:   DIAGNOSTIC FINDINGS: X-ray IMPRESSION: 1. Osteopenia without evidence of thoracic spinal compression fracture. 2. Mild/moderate thoracic kyphosis and slight dextroscoliosis. 3. 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  PATIENT SURVEYS:  FOTO 42.   POSTURE: rounded shoulders, forward head, and increased thoracic kyphosis  PALPATION: Patient c/o palpable tenderness from ~T2 to T7 over left scapular musculature and to a lesser degree on the right.  UE ROM:  Bilateral UE AROM is WFL.  UE STRENGTH:  Normal bilateral UE strength.  DTR'S:  Normal bilateral UE DTR's.   GAIT: WNL.  TODAY'S TREATMENT:                                                                                                                               DATE:                                                       07/28/22   Combo e'stim/US at 1.50 W.CM2 x 12 minutes  to bil UT's STW/M x 12 minutes  to LT side UT and levator with ischemic release technique utilized. HMP and IFC at 80-150 Hz on 40% scan x 15 minutes in sitting   Patient tolerated treatment without complaint with normal modality response following removal of modality.     ASSESSMENT:  CLINICAL IMPRESSION:  Patient pleased with her progress with no c/o thoracic pain now. She is still with pain 3-4/10 near UT and levator LT side. She has met 2 out of 3 LTG's at this point. Good TP release today with STW and TPR.  OBJECTIVE IMPAIRMENTS: decreased activity tolerance, increased muscle spasms, postural dysfunction, and pain.   ACTIVITY LIMITATIONS: carrying, lifting, and standing  PARTICIPATION LIMITATIONS: meal prep, cleaning, and laundry  REHAB POTENTIAL: Good  CLINICAL DECISION MAKING: Stable/uncomplicated  EVALUATION COMPLEXITY: Low   GOALS:  LONG TERM GOALS: Target date: 06/16/22  Ind with an HEP.  Goal status: MET  2.  Lift grandson with pain not exceeding 2-3/10.  Goal status: MET  3.  Perform ADL's with pain not exceeding 2-3/10.  Goal status: On-going    PLAN:  PT FREQUENCY: 2x/week  PT DURATION: 6 weeks  PLANNED INTERVENTIONS: Therapeutic exercises, Therapeutic activity, Patient/Family education, Self Care, Dry Needling, Electrical stimulation, Cryotherapy, Moist heat, Ultrasound, and Manual therapy.  PLAN FOR NEXT SESSION: Combo e'stim/US, STW/M, postural exercises, scapular exercises.   Perrion Diesel,[name], PTA 07/28/2022, 2:02 PM

## 2022-07-29 NOTE — Addendum Note (Signed)
Addended by: Sina Sumpter, [place] W on: 07/29/2022 01:05 PM   Modules accepted: Orders

## 2022-08-04 ENCOUNTER — Encounter: Payer: Self-pay | Admitting: *Deleted

## 2022-08-04 ENCOUNTER — Ambulatory Visit: Payer: Medicare PPO | Admitting: *Deleted

## 2022-08-04 DIAGNOSIS — M62830 Muscle spasm of back: Secondary | ICD-10-CM | POA: Diagnosis not present

## 2022-08-04 DIAGNOSIS — R293 Abnormal posture: Secondary | ICD-10-CM

## 2022-08-04 DIAGNOSIS — M546 Pain in thoracic spine: Secondary | ICD-10-CM

## 2022-08-04 NOTE — Therapy (Signed)
OUTPATIENT PHYSICAL THERAPY THORACIC TREATMENT   Patient Name: Theresa Reese MRN: [id number] DOB:03/21/1951, 72 y.o., female Today's Date: 08/04/2022  END OF SESSION:  PT End of Session - 08/04/22 1334     Visit Number 14    Number of Visits 18    Authorization Type FOTO.    PT Start Time 1302    PT Stop Time 1348    PT Time Calculation (min) 46 min              Past Medical History:  Diagnosis Date   GERD (gastroesophageal reflux disease)    Hypercholesteremia    Hypertension    Hypothyroid    Insomnia    Left knee DJD    Left shoulder pain    Lymphocytic colitis    Past Surgical History:  Procedure Laterality Date   BACK SURGERY     CESAREAN SECTION  1987   CESAREAN SECTION  1990   COLONOSCOPY     EYE SURGERY Bilateral    cataract   JOINT REPLACEMENT Left    KNEE ARTHROSCOPY W/ MENISCECTOMY Left 2014   Dr Shelle Iron   low back surgery  1997   Dr Jule Ser   low back surgery  1994   Dr Elesa Hacker   RETINAL DETACHMENT SURGERY Right 2013   SHOULDER ARTHROSCOPY WITH ROTATOR CUFF REPAIR Left 05/22/2019   Procedure: LEFT SHOULDER ARTHROSCOPY WITH ROTATOR CUFF REPAIR;  Surgeon: Bjorn Pippin, MD;  Location: [facility];  Service: Orthopedics;  Laterality: Left;   SHOULDER ARTHROSCOPY WITH SUBACROMIAL DECOMPRESSION AND BICEP TENDON REPAIR Left 05/22/2019   Procedure: LEFT SHOULDER ARTHROSCOPY  DEBRIDEMENT WITH SUBACROMIAL DECOMPRESSION AND BICEP TENODESIS;  Surgeon: Bjorn Pippin, MD;  Location: [facility];  Service: Orthopedics;  Laterality: Left;   TOTAL KNEE ARTHROPLASTY Left 09/09/2013   Procedure: LEFT TOTAL KNEE ARTHROPLASTY;  Surgeon: Nilda Simmer, MD;  Location: [facility];  Service: Orthopedics;  Laterality: Left;   Patient Active Problem List   Diagnosis Date Noted   DJD (degenerative joint disease) of knee 09/09/2013   Insomnia    Depression with anxiety    Hypothyroid    Lymphocytic colitis    Left knee DJD     REFERRING PROVIDER:  Gweneth Dimitri MD  REFERRING DIAG: Pain in thoracic spine.  Rationale for Evaluation and Treatment: Rehabilitation  THERAPY DIAG:  Abnormal posture  Muscle spasm of back  Pain in thoracic spine  ONSET DATE: 12/22/2021  SUBJECTIVE:  SUBJECTIVE STATEMENT: Not doing well  againToday due to sinus infection that continues to linger. Pain is mainly LT side at UT and base of neck 4/10  PERTINENT HISTORY:  HTN, osteopenia, left shoulder surgery, low back surgery and left knee surgery.  PAIN:  Are you having pain? Yes: NPRS scale: 4/10 Pain location: Mid-back, left > right. Pain description: As above. Aggravating factors: As above. Relieving factors: As above.  PRECAUTIONS: Other: Osteopenia.  WEIGHT BEARING RESTRICTIONS: No  FALLS:  Has patient fallen in last 6 months? No  LIVING ENVIRONMENT: Lives in: House/apartment Has following equipment at home: None  PLOF: Independent  PATIENT GOALS: Decrease pain.  Lift grandson without a lot less pain.   OBJECTIVE:   DIAGNOSTIC FINDINGS: X-ray IMPRESSION: 1. Osteopenia without evidence of thoracic spinal compression fracture. 2. Mild/moderate thoracic kyphosis and slight dextroscoliosis. 3. 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  PATIENT SURVEYS:  FOTO 42.   POSTURE: rounded shoulders, forward head, and increased thoracic kyphosis  PALPATION: Patient c/o palpable tenderness from ~T2 to T7 over left scapular musculature and to a lesser degree on the right.  UE ROM:  Bilateral UE AROM is WFL.  UE STRENGTH:  Normal bilateral UE strength.  DTR'S:  Normal bilateral UE DTR's.   GAIT: WNL.  TODAY'S TREATMENT:                                                                                                                               DATE:                                                       08/04/22   Combo e'stim/US at 1.50 W.CM2 x 12 minutes  to bil UT's STW/M x 12 minutes  to LT side UT and levator with ischemic release technique utilized. HMP and IFC at 80-150 Hz on 40% scan x 15 minutes in sitting   Patient tolerated treatment without complaint with normal modality response following removal of modality.     ASSESSMENT:  CLINICAL IMPRESSION:  Patient arrived today not feeling well due to sinus infection still.  She is still with pain 3-4/10 near UT and levator LT side. She has met 2 out of 3 LTG's at this point. Good TP release again today with STW and TPR LT side. LTG #3 On going due to pain  OBJECTIVE IMPAIRMENTS: decreased activity tolerance, increased muscle spasms, postural dysfunction, and pain.   ACTIVITY LIMITATIONS: carrying, lifting, and standing  PARTICIPATION LIMITATIONS: meal prep, cleaning, and laundry  REHAB POTENTIAL: Good  CLINICAL DECISION MAKING: Stable/uncomplicated  EVALUATION COMPLEXITY: Low   GOALS:  LONG TERM GOALS: Target date: 06/16/22  Ind with an HEP.  Goal status: MET  2.  Lift grandson with pain  not exceeding 2-3/10.  Goal status: MET  3.  Perform ADL's with pain not exceeding 2-3/10.  Goal status: On-going    PLAN:  PT FREQUENCY: 2x/week  PT DURATION: 6 weeks  PLANNED INTERVENTIONS: Therapeutic exercises, Therapeutic activity, Patient/Family education, Self Care, Dry Needling, Electrical stimulation, Cryotherapy, Moist heat, Ultrasound, and Manual therapy.  PLAN FOR NEXT SESSION: Combo e'stim/US, STW/M, postural exercises, scapular exercises.   Geneive Sandstrom,[name], PTA 08/04/2022, 2:39 PM

## 2022-08-09 DIAGNOSIS — J3489 Other specified disorders of nose and nasal sinuses: Secondary | ICD-10-CM | POA: Diagnosis not present

## 2022-08-09 DIAGNOSIS — E039 Hypothyroidism, unspecified: Secondary | ICD-10-CM | POA: Diagnosis not present

## 2022-08-09 DIAGNOSIS — I4891 Unspecified atrial fibrillation: Secondary | ICD-10-CM | POA: Diagnosis not present

## 2022-08-09 DIAGNOSIS — R42 Dizziness and giddiness: Secondary | ICD-10-CM | POA: Diagnosis not present

## 2022-08-09 DIAGNOSIS — Z6824 Body mass index (BMI) 24.0-24.9, adult: Secondary | ICD-10-CM | POA: Diagnosis not present

## 2022-08-09 DIAGNOSIS — J301 Allergic rhinitis due to pollen: Secondary | ICD-10-CM | POA: Diagnosis not present

## 2022-08-11 ENCOUNTER — Ambulatory Visit: Payer: Medicare PPO | Admitting: Cardiology

## 2022-08-11 ENCOUNTER — Encounter: Payer: Medicare PPO | Admitting: *Deleted

## 2022-08-11 ENCOUNTER — Encounter: Payer: Self-pay | Admitting: Cardiology

## 2022-08-11 VITALS — BP 153/90 | HR 97 | Resp 16 | Ht 66.0 in | Wt 144.0 lb

## 2022-08-11 DIAGNOSIS — I10 Essential (primary) hypertension: Secondary | ICD-10-CM

## 2022-08-11 DIAGNOSIS — R9431 Abnormal electrocardiogram [ECG] [EKG]: Secondary | ICD-10-CM | POA: Diagnosis not present

## 2022-08-11 DIAGNOSIS — R000 Tachycardia, unspecified: Secondary | ICD-10-CM | POA: Diagnosis not present

## 2022-08-11 DIAGNOSIS — E782 Mixed hyperlipidemia: Secondary | ICD-10-CM | POA: Diagnosis not present

## 2022-08-11 DIAGNOSIS — R072 Precordial pain: Secondary | ICD-10-CM

## 2022-08-11 DIAGNOSIS — I480 Paroxysmal atrial fibrillation: Secondary | ICD-10-CM | POA: Diagnosis not present

## 2022-08-11 MED ORDER — METOPROLOL TARTRATE 25 MG PO TABS: 25.0000 mg | ORAL_TABLET | Freq: Two times a day (BID) | ORAL | 0 refills | Status: DC | PRN

## 2022-08-11 MED ORDER — RIVAROXABAN 20 MG PO TABS: 20.0000 mg | ORAL_TABLET | Freq: Every day | ORAL | 0 refills | Status: DC

## 2022-08-11 NOTE — Progress Notes (Incomplete)
ID:  [name], DOB [birth year], MRN [id number]  PCP:  Gweneth Dimitri, MD  Cardiologist:  Tessa Lerner, DO, [facility] (established care Aug 11, 2022)  REASON FOR CONSULT: Tachycardia  REQUESTING PHYSICIAN:  Gweneth Dimitri, MD [address] [place],  [place] [address]  Chief Complaint  Patient presents with  . Tachycardia  . New Patient (Initial Visit)    HPI  Theresa Reese is a 72 y.o. Caucasian female who presents to the clinic for evaluation of tachycardia at the request of Gweneth Dimitri, MD. Her past medical history and cardiovascular risk factors include: Hypothyroidism, hyperlipidemia, hypertension, major depression with anxiety,former smoker,   *** Britt significant other 2 episodes of aifb  New dx  Can feel them  Duration 5 -10 mins  No gi bleeding, brain bleeed No falls Hypothyroid   March - flutter "out of my chest"  May 2nd - flutter   Mucinex D   CP Left anterior  Squezzing  Weekly  SL Not w/ exerction  Not w/ rest  Not w/ stress  ?anxiety    Evaluation for tachycardia likely secondary to chronic Sudafed use and persistent illness and hypothyroidism.  Kardia mobile tracing likely normal sinus rhythm with ectopic beats low probability of A-fib.   FUNCTIONAL STATUS: Enjoys gardening, takes care of grand kids, No structured exercise program or daily routine.   ALLERGIES: Allergies  Allergen Reactions  . Cephalexin Other (See Comments)    Yeast infection  . Clarithromycin Other (See Comments)  . Erythromycin Nausea And Vomiting and Other (See Comments)    Diarrhea   Hospitalized for dehydration  . Other     Other Reaction(s): Not available  . Penicillins Rash    MEDICATION LIST PRIOR TO VISIT: Current Meds  Medication Sig  . albuterol (VENTOLIN HFA) 108 (90 Base) MCG/ACT inhaler Inhale into the lungs every 6 (six) hours as needed for wheezing or shortness of breath.  Marland Kitchen atorvastatin (LIPITOR) 20 MG tablet Take 20 mg by mouth daily.   . Azelastine HCl 137 MCG/SPRAY SOLN Place 1 spray into both nostrils 2 (two) times daily.  Marland Kitchen b complex vitamins tablet Take 1 tablet by mouth daily.  Marland Kitchen buPROPion (WELLBUTRIN XL) 150 MG 24 hr tablet Take 150 mg by mouth every morning.  . Calcium-Magnesium-Vitamin D (CALCIUM 500 PO) Take 500 mg by mouth daily.  . Cholecalciferol (VITAMIN D) 2000 UNITS CAPS Take 2,000 Units by mouth daily.  Marland Kitchen conjugated estrogens (PREMARIN) vaginal cream Place 1 Applicatorful vaginally daily.  . fluticasone (FLONASE) 50 MCG/ACT nasal spray Place into both nostrils daily.  . hydrochlorothiazide (HYDRODIURIL) 12.5 MG tablet Take 12.5 mg by mouth every morning.  Marland Kitchen levothyroxine (SYNTHROID, LEVOTHROID) 112 MCG tablet Take 112 mcg by mouth daily before breakfast.  . lisinopril (ZESTRIL) 20 MG tablet Take 20 mg by mouth daily.  . Multiple Vitamin (MULTIVITAMIN WITH MINERALS) TABS tablet Take 1 tablet by mouth daily.  . RESTASIS 0.05 % ophthalmic emulsion Place 1 drop into both eyes 2 (two) times daily.  . vitamin C (ASCORBIC ACID) 500 MG tablet Take 500 mg by mouth daily.     PAST MEDICAL HISTORY: Past Medical History:  Diagnosis Date  . GERD (gastroesophageal reflux disease)   . Hypercholesteremia   . Hypertension   . Hypothyroid   . Insomnia   . Left knee DJD   . Left shoulder pain   . Lymphocytic colitis     PAST SURGICAL HISTORY: Past Surgical History:  Procedure Laterality Date  .  BACK SURGERY    . CESAREAN SECTION  1987  . CESAREAN SECTION  1990  . COLONOSCOPY    . EYE SURGERY Bilateral    cataract  . JOINT REPLACEMENT Left   . KNEE ARTHROSCOPY W/ MENISCECTOMY Left 2014   Dr Shelle Iron  . low back surgery  1997   Dr Jule Ser  . low back surgery  1994   Dr Elesa Hacker  . RETINAL DETACHMENT SURGERY Right 2013  . SHOULDER ARTHROSCOPY WITH ROTATOR CUFF REPAIR Left 05/22/2019   Procedure: LEFT SHOULDER ARTHROSCOPY WITH ROTATOR CUFF REPAIR;  Surgeon: Bjorn Pippin, MD;  Location: [facility];   Service: Orthopedics;  Laterality: Left;  . SHOULDER ARTHROSCOPY WITH SUBACROMIAL DECOMPRESSION AND BICEP TENDON REPAIR Left 05/22/2019   Procedure: LEFT SHOULDER ARTHROSCOPY  DEBRIDEMENT WITH SUBACROMIAL DECOMPRESSION AND BICEP TENODESIS;  Surgeon: Bjorn Pippin, MD;  Location: [facility];  Service: Orthopedics;  Laterality: Left;  . TOTAL KNEE ARTHROPLASTY Left 09/09/2013   Procedure: LEFT TOTAL KNEE ARTHROPLASTY;  Surgeon: Nilda Simmer, MD;  Location: [facility];  Service: Orthopedics;  Laterality: Left;    FAMILY HISTORY: The patient family history includes Heart attack in her father and mother; Heart disease in her mother; Hypertension in her father and mother; Stroke in her father.  SOCIAL HISTORY:  The patient  reports that she quit smoking about 51 years ago. Her smoking use included cigarettes. She has a 10.00 pack-year smoking history. She has never used smokeless tobacco. She reports current alcohol use. She reports that she does not use drugs.  REVIEW OF SYSTEMS: ROS  PHYSICAL EXAM:    08/11/2022   10:36 AM 05/22/2019    4:35 PM 05/22/2019    4:30 PM  Vitals with BMI  Height 5\' 6"     Weight 144 lbs    BMI 23.25    Systolic 153 144   Diastolic 90 74   Pulse 97 93 93    Physical Exam   CARDIAC DATABASE: EKG: Aug 11, 2022: Sinus rhythm, 96 bpm, ST depressions in the inferolateral leads, no prior EKGs available for comparison.  Echocardiogram: No results found for this or any previous visit from the past 1095 days.    Stress Testing: No results found for this or any previous visit from the past 1095 days.   Heart Catheterization: None  LABORATORY DATA: External Labs: Collected: July 15, 2022 provided by PCP. TSH 21.49. Marland Kitchen Collected: April 01, 2022: Creatinine 0.87.  eGFR 72. Sodium 139, potassium 5.3, chloride 105, bicarb 27.   Collected: 08/27/2021. Total cholesterol 182, triglycerides 87, HDL 59, LDL 107, non-HDL 123  IMPRESSION:     ICD-10-CM   1. Tachycardia  R00.0 EKG 12-Lead    2. Paroxysmal atrial fibrillation (HCC)  I48.0     3. Benign hypertension  I10     4. Precordial pain  R07.2     5. Nonspecific abnormal electrocardiogram (ECG) (EKG)  R94.31     6. Mixed hyperlipidemia  E78.2        RECOMMENDATIONS: ASSATA SHOAFF is a 72 y.o. *** female whose past medical history and cardiac risk factors include: ***    Click Here to Calculate/Change CHADS2VASc Score The patient's CHADS2-VASc score is 3, indicating a 3.2% annual risk of stroke.  Therefore, anticoagulation {is/is not:320031} recommended.   CHF History: No HTN History: Yes Diabetes History: No Stroke History: No Vascular Disease History: No   FINAL MEDICATION LIST END OF ENCOUNTER: No orders of the defined  types were placed in this encounter.   Medications Discontinued During This Encounter  Medication Reason  . ibandronate (BONIVA) 150 MG tablet      Current Outpatient Medications:  .  albuterol (VENTOLIN HFA) 108 (90 Base) MCG/ACT inhaler, Inhale into the lungs every 6 (six) hours as needed for wheezing or shortness of breath., Disp: , Rfl:  .  atorvastatin (LIPITOR) 20 MG tablet, Take 20 mg by mouth daily., Disp: , Rfl:  .  Azelastine HCl 137 MCG/SPRAY SOLN, Place 1 spray into both nostrils 2 (two) times daily., Disp: , Rfl:  .  b complex vitamins tablet, Take 1 tablet by mouth daily., Disp: , Rfl:  .  buPROPion (WELLBUTRIN XL) 150 MG 24 hr tablet, Take 150 mg by mouth every morning., Disp: , Rfl:  .  Calcium-Magnesium-Vitamin D (CALCIUM 500 PO), Take 500 mg by mouth daily., Disp: , Rfl:  .  Cholecalciferol (VITAMIN D) 2000 UNITS CAPS, Take 2,000 Units by mouth daily., Disp: , Rfl:  .  conjugated estrogens (PREMARIN) vaginal cream, Place 1 Applicatorful vaginally daily., Disp: , Rfl:  .  fluticasone (FLONASE) 50 MCG/ACT nasal spray, Place into both nostrils daily., Disp: , Rfl:  .  hydrochlorothiazide (HYDRODIURIL) 12.5 MG tablet, Take  12.5 mg by mouth every morning., Disp: , Rfl:  .  levothyroxine (SYNTHROID, LEVOTHROID) 112 MCG tablet, Take 112 mcg by mouth daily before breakfast., Disp: , Rfl:  .  lisinopril (ZESTRIL) 20 MG tablet, Take 20 mg by mouth daily., Disp: , Rfl:  .  Multiple Vitamin (MULTIVITAMIN WITH MINERALS) TABS tablet, Take 1 tablet by mouth daily., Disp: , Rfl:  .  RESTASIS 0.05 % ophthalmic emulsion, Place 1 drop into both eyes 2 (two) times daily., Disp: , Rfl:  .  vitamin C (ASCORBIC ACID) 500 MG tablet, Take 500 mg by mouth daily., Disp: , Rfl:   Orders Placed This Encounter  Procedures  . EKG 12-Lead    There are no Patient Instructions on file for this visit.   --Continue cardiac medications as reconciled in final medication list. --No follow-ups on file. or sooner if needed. --Continue follow-up with your primary care physician regarding the management of your other chronic comorbid conditions.  Patient's questions and concerns were addressed to her satisfaction. She voices understanding of the instructions provided during this encounter.   This note was created using a voice recognition software as a result there may be grammatical errors inadvertently enclosed that do not reflect the nature of this encounter. Every attempt is made to correct such errors.  Tessa Lerner, [place], [facility]  Pager:  [phone] Office: [phone]

## 2022-08-11 NOTE — Progress Notes (Signed)
ID:  Theresa Reese, DOB 1950-08-30, MRN [id number]  PCP:  Gweneth Dimitri, MD  Cardiologist:  Tessa Lerner, DO, [facility] (established care Aug 11, 2022)  REASON FOR CONSULT: Tachycardia  REQUESTING PHYSICIAN:  Gweneth Dimitri, MD [address] [place],  [place] [address]  Chief Complaint  Patient presents with   Tachycardia   New Patient (Initial Visit)    HPI  Theresa Reese is a 72 y.o. Caucasian female who presents to the clinic for evaluation of tachycardia at the request of Gweneth Dimitri, MD. Her past medical history and cardiovascular risk factors include: Hypothyroidism, hyperlipidemia, hypertension, major depression with anxiety,former smoker.   Patient is referred to the practice for evaluation of tachycardia/atrial fibrillation.  She is accompanied by her significant other Edwinna Areola and patient provides verbal consent with having him present during today's encounter.  Patient states that she has had episodes of tachycardia which she describes as "feels like the heart is jumping out of the chest."  Initially did not think much of it as she was on Mucinex D for congestion.  However, during 1 of these episodes she was able to monitor her rhythm on her iWatch as well as Kardia mobile.  Rhythm strips were provided today for my review.  Underlying rhythm is atrial fibrillation and appears to be paroxysmal.  She is able to appreciate when she goes in and out of A-fib.  Duration is usually 5 to 10 minutes according to her.  No history of gastrointestinal intracranial bleeding.  No recent falls.  She has known history of hypothyroidism.  Review of systems also positive for chest pain.   Located over the left anterior chest, squeezing sensation, 30 minutes in duration, occurs weekly, self-limited.  Not brought on by effort related activities/emotional stress, does not resolve with rest.  FUNCTIONAL STATUS: Enjoys gardening, takes care of grand kids, No structured exercise program or  daily routine.   ALLERGIES: Allergies  Allergen Reactions   Cephalexin Other (See Comments)    Yeast infection   Clarithromycin Other (See Comments)   Erythromycin Nausea And Vomiting and Other (See Comments)    Diarrhea   Hospitalized for dehydration   Other     Other Reaction(s): Not available   Penicillins Rash    MEDICATION LIST PRIOR TO VISIT: Current Meds  Medication Sig   albuterol (VENTOLIN HFA) 108 (90 Base) MCG/ACT inhaler Inhale into the lungs every 6 (six) hours as needed for wheezing or shortness of breath.   atorvastatin (LIPITOR) 20 MG tablet Take 20 mg by mouth daily.   Azelastine HCl 137 MCG/SPRAY SOLN Place 1 spray into both nostrils 2 (two) times daily.   b complex vitamins tablet Take 1 tablet by mouth daily.   buPROPion (WELLBUTRIN XL) 150 MG 24 hr tablet Take 150 mg by mouth every morning.   Calcium-Magnesium-Vitamin D (CALCIUM 500 PO) Take 500 mg by mouth daily.   Cholecalciferol (VITAMIN D) 2000 UNITS CAPS Take 2,000 Units by mouth daily.   conjugated estrogens (PREMARIN) vaginal cream Place 1 Applicatorful vaginally daily.   fluticasone (FLONASE) 50 MCG/ACT nasal spray Place into both nostrils daily.   hydrochlorothiazide (HYDRODIURIL) 12.5 MG tablet Take 12.5 mg by mouth every morning.   levothyroxine (SYNTHROID, LEVOTHROID) 112 MCG tablet Take 112 mcg by mouth daily before breakfast.   lisinopril (ZESTRIL) 20 MG tablet Take 20 mg by mouth daily.   metoprolol tartrate (LOPRESSOR) 25 MG tablet Take 1 tablet (25 mg total) by mouth 2 (two) times daily  as needed.   Multiple Vitamin (MULTIVITAMIN WITH MINERALS) TABS tablet Take 1 tablet by mouth daily.   RESTASIS 0.05 % ophthalmic emulsion Place 1 drop into both eyes 2 (two) times daily.   rivaroxaban (XARELTO) 15 MG TABS tablet Take 1 tablet (15 mg total) by mouth daily with supper.   vitamin C (ASCORBIC ACID) 500 MG tablet Take 500 mg by mouth daily.   [DISCONTINUED] rivaroxaban (XARELTO) 20 MG TABS tablet  Take 1 tablet (20 mg total) by mouth daily with supper.     PAST MEDICAL HISTORY: Past Medical History:  Diagnosis Date   GERD (gastroesophageal reflux disease)    Hypercholesteremia    Hypertension    Hypothyroid    Insomnia    Left knee DJD    Left shoulder pain    Lymphocytic colitis     PAST SURGICAL HISTORY: Past Surgical History:  Procedure Laterality Date   BACK SURGERY     CESAREAN SECTION  1987   CESAREAN SECTION  1990   COLONOSCOPY     EYE SURGERY Bilateral    cataract   JOINT REPLACEMENT Left    KNEE ARTHROSCOPY W/ MENISCECTOMY Left 2014   Dr Shelle Iron   low back surgery  1997   Dr Jule Ser   low back surgery  1994   Dr Elesa Hacker   RETINAL DETACHMENT SURGERY Right 2013   SHOULDER ARTHROSCOPY WITH ROTATOR CUFF REPAIR Left 05/22/2019   Procedure: LEFT SHOULDER ARTHROSCOPY WITH ROTATOR CUFF REPAIR;  Surgeon: Bjorn Pippin, MD;  Location: [facility];  Service: Orthopedics;  Laterality: Left;   SHOULDER ARTHROSCOPY WITH SUBACROMIAL DECOMPRESSION AND BICEP TENDON REPAIR Left 05/22/2019   Procedure: LEFT SHOULDER ARTHROSCOPY  DEBRIDEMENT WITH SUBACROMIAL DECOMPRESSION AND BICEP TENODESIS;  Surgeon: Bjorn Pippin, MD;  Location: [facility];  Service: Orthopedics;  Laterality: Left;   TOTAL KNEE ARTHROPLASTY Left 09/09/2013   Procedure: LEFT TOTAL KNEE ARTHROPLASTY;  Surgeon: Nilda Simmer, MD;  Location: [facility];  Service: Orthopedics;  Laterality: Left;    FAMILY HISTORY: The patient family history includes Heart attack in her father and mother; Heart disease in her mother; Hypertension in her father and mother; Stroke in her father.  SOCIAL HISTORY:  The patient  reports that she quit smoking about 51 years ago. Her smoking use included cigarettes. She has a 10.00 pack-year smoking history. She has never used smokeless tobacco. She reports current alcohol use. She reports that she does not use drugs.  REVIEW OF SYSTEMS: Review of Systems   Cardiovascular:  Positive for chest pain and palpitations. Negative for claudication, dyspnea on exertion, irregular heartbeat, leg swelling, near-syncope, orthopnea, paroxysmal nocturnal dyspnea and syncope.  Respiratory:  Negative for shortness of breath.   Hematologic/Lymphatic: Negative for bleeding problem.  Musculoskeletal:  Negative for muscle cramps and myalgias.  Neurological:  Negative for dizziness and light-headedness.  Psychiatric/Behavioral:  The patient is nervous/anxious.     PHYSICAL EXAM:    08/11/2022   10:36 AM 05/22/2019    4:35 PM 05/22/2019    4:30 PM  Vitals with BMI  Height 5\' 6"     Weight 144 lbs    BMI 23.25    Systolic 153 144   Diastolic 90 74   Pulse 97 93 93    Physical Exam  Constitutional: No distress.  Age appropriate, hemodynamically stable.   Neck: No JVD present.  Cardiovascular: Regular rhythm, S1 normal, S2 normal, intact distal pulses and normal pulses. Tachycardia present. Exam reveals no  gallop, no S3 and no S4.  No murmur heard. Pulmonary/Chest: Effort normal and breath sounds normal. No stridor. She has no wheezes. She has no rales.  Abdominal: Soft. Bowel sounds are normal. She exhibits no distension. There is no abdominal tenderness.  Musculoskeletal:        General: No edema.     Cervical back: Neck supple.  Neurological: She is alert and oriented to person, place, and time. She has intact cranial nerves (2-12).  Skin: Skin is warm and moist.   CARDIAC DATABASE: EKG: Aug 11, 2022: Sinus rhythm, 96 bpm, ST depressions in the inferolateral leads, no prior EKGs available for comparison.  Echocardiogram: No results found for this or any previous visit from the past 1095 days.    Stress Testing: No results found for this or any previous visit from the past 1095 days.   Heart Catheterization: None       LABORATORY DATA: External Labs: Collected: July 15, 2022 provided by PCP. TSH 21.49. Marland Kitchen Collected: April 01, 2022: Creatinine 0.87.  eGFR 72. Sodium 139, potassium 5.3, chloride 105, bicarb 27.   Collected: 08/27/2021. Total cholesterol 182, triglycerides 87, HDL 59, LDL 107, non-HDL 123  Estimated Creatinine Clearance: 43.4 mL/min (A) (by C-G formula based on SCr of 1.13 mg/dL (H)).   IMPRESSION:    ICD-10-CM   1. Tachycardia  R00.0 EKG 12-Lead    LONG TERM MONITOR (3-14 DAYS)    2. Paroxysmal atrial fibrillation (HCC)  I48.0 PCV ECHOCARDIOGRAM COMPLETE    LONG TERM MONITOR (3-14 DAYS)    metoprolol tartrate (LOPRESSOR) 25 MG tablet    Basic metabolic panel    Magnesium    Hemoglobin and hematocrit, blood    rivaroxaban (XARELTO) 15 MG TABS tablet    DISCONTINUED: rivaroxaban (XARELTO) 20 MG TABS tablet    3. Benign hypertension  I10     4. Precordial pain  R07.2 PCV ECHOCARDIOGRAM COMPLETE    PCV MYOCARDIAL PERFUSION WO LEXISCAN    5. Nonspecific abnormal electrocardiogram (ECG) (EKG)  R94.31     6. Mixed hyperlipidemia  E78.2        RECOMMENDATIONS: VASSILIKI FRAGER is a 72 y.o. Caucasian female whose past medical history and cardiac risk factors include: Hypothyroidism, hyperlipidemia, hypertension, major depression with anxiety,former smoker.  Tachycardia Continues to have episodes of tachycardia despite discontinuation of Sudafed and Mucinex D. Has captured a few episodes of atrial fibrillation on her iWatch as well as Kardia mobile.  Images uploaded in the media section. She also has underlying thyroid disease-TSH is not within normal limits.  Currently working with PCP Will start Lopressor 25 mg p.o. twice daily for symptom management. Zio patch to evaluate for dysrhythmias and A-fib burden (if able I recommended her to hold Lopressor while using the Zio patch).  Paroxysmal atrial fibrillation (HCC) Rate control: Metoprolol. Rhythm control: N/A. Thromboembolic prophylaxis Xarelto. CHA2DS2-VASc SCORE is 3 which correlates to 3.2% risk of stroke per year (age, gender,  hypertension).  Appreciates when she goes in and out of atrial fibrillation. Zio patch to evaluate for A-fib burden Overall disease burden may improve with treating underlying thyroid disease, she is currently seeing her PCP. Risks, benefits, and alternatives to anticoagulation discussed.  Both patient and significant other agreement with initiation of anticoagulation. At the completion of the note labs from 08/11/2022 are available and based on her creatinine clearance the dose of Xarelto was reduced to 15 mg p.o. q. Supper. Estimated Creatinine Clearance: 43.4 mL/min (A) (by C-G  formula based on SCr of 1.13 mg/dL (H)). Further recommendations to follow  Benign hypertension Office blood pressures are not well-controlled. Likely secondary to being anxious. Advised her to keep a log of her blood pressures and to review with PCP to see if additional medication titration is warranted. Reemphasized the importance of low-salt diet.  Precordial pain Nonspecific abnormal electrocardiogram (ECG) (EKG) Symptoms concerning for both cardiac and noncardiac discomfort. EKG shows ST-T changes in the inferolateral leads. Patient is able to exercise and therefore we will recommend exercise nuclear stress test to evaluate for reversible ischemia. Echo will be ordered to evaluate for structural heart disease and left ventricular systolic function.  Mixed hyperlipidemia Currently on atorvastatin.   She denies myalgia or other side effects. Most recent lipids dated June 2023, independently reviewed as noted above. Currently managed by primary care provider.  Data Reviewed: I have independently reviewed external notes provided by the referring provider as part of this office visit.   I have independently reviewed results of EKG, labs, smart watch and Kardia mobile tracings reviewed and uploaded as part of medical decision making. I have ordered the following tests:  Orders Placed This Encounter  Procedures    Basic metabolic panel   Magnesium   Hemoglobin and hematocrit, blood   PCV MYOCARDIAL PERFUSION WO LEXISCAN    Standing Status:   Future    Standing Expiration Date:   08/11/2023   LONG TERM MONITOR (3-14 DAYS)    Standing Status:   Future    Number of Occurrences:   1    Order Specific Question:   Where should this test be performed?    Answer:   [facility]    Order Specific Question:   Does the patient have an implanted cardiac device?    Answer:   No    Order Specific Question:   Prescribed days of wear    Answer:   25    Order Specific Question:   Type of enrollment    Answer:   Clinic Enrollment   EKG 12-Lead   PCV ECHOCARDIOGRAM COMPLETE    Standing Status:   Future    Standing Expiration Date:   08/11/2023   I have made medications changes at today's encounter as noted above. History of present illness was obtained by both patient and Mr. Moshe Cipro at today's office visit.    FINAL MEDICATION LIST END OF ENCOUNTER: Meds ordered this encounter  Medications   metoprolol tartrate (LOPRESSOR) 25 MG tablet    Sig: Take 1 tablet (25 mg total) by mouth 2 (two) times daily as needed.    Dispense:  30 tablet    Refill:  0   DISCONTD: rivaroxaban (XARELTO) 20 MG TABS tablet    Sig: Take 1 tablet (20 mg total) by mouth daily with supper.    Dispense:  90 tablet    Refill:  0   rivaroxaban (XARELTO) 15 MG TABS tablet    Sig: Take 1 tablet (15 mg total) by mouth daily with supper.    Dispense:  90 tablet    Refill:  0    Medications Discontinued During This Encounter  Medication Reason   ibandronate (BONIVA) 150 MG tablet    rivaroxaban (XARELTO) 20 MG TABS tablet Dose change     Current Outpatient Medications:    albuterol (VENTOLIN HFA) 108 (90 Base) MCG/ACT inhaler, Inhale into the lungs every 6 (six) hours as needed for wheezing or shortness of breath., Disp: , Rfl:    atorvastatin (  LIPITOR) 20 MG tablet, Take 20 mg by mouth daily., Disp: , Rfl:    Azelastine HCl  137 MCG/SPRAY SOLN, Place 1 spray into both nostrils 2 (two) times daily., Disp: , Rfl:    b complex vitamins tablet, Take 1 tablet by mouth daily., Disp: , Rfl:    buPROPion (WELLBUTRIN XL) 150 MG 24 hr tablet, Take 150 mg by mouth every morning., Disp: , Rfl:    Calcium-Magnesium-Vitamin D (CALCIUM 500 PO), Take 500 mg by mouth daily., Disp: , Rfl:    Cholecalciferol (VITAMIN D) 2000 UNITS CAPS, Take 2,000 Units by mouth daily., Disp: , Rfl:    conjugated estrogens (PREMARIN) vaginal cream, Place 1 Applicatorful vaginally daily., Disp: , Rfl:    fluticasone (FLONASE) 50 MCG/ACT nasal spray, Place into both nostrils daily., Disp: , Rfl:    hydrochlorothiazide (HYDRODIURIL) 12.5 MG tablet, Take 12.5 mg by mouth every morning., Disp: , Rfl:    levothyroxine (SYNTHROID, LEVOTHROID) 112 MCG tablet, Take 112 mcg by mouth daily before breakfast., Disp: , Rfl:    lisinopril (ZESTRIL) 20 MG tablet, Take 20 mg by mouth daily., Disp: , Rfl:    metoprolol tartrate (LOPRESSOR) 25 MG tablet, Take 1 tablet (25 mg total) by mouth 2 (two) times daily as needed., Disp: 30 tablet, Rfl: 0   Multiple Vitamin (MULTIVITAMIN WITH MINERALS) TABS tablet, Take 1 tablet by mouth daily., Disp: , Rfl:    RESTASIS 0.05 % ophthalmic emulsion, Place 1 drop into both eyes 2 (two) times daily., Disp: , Rfl:    rivaroxaban (XARELTO) 15 MG TABS tablet, Take 1 tablet (15 mg total) by mouth daily with supper., Disp: 90 tablet, Rfl: 0   vitamin C (ASCORBIC ACID) 500 MG tablet, Take 500 mg by mouth daily., Disp: , Rfl:   Orders Placed This Encounter  Procedures   Basic metabolic panel   Magnesium   Hemoglobin and hematocrit, blood   PCV MYOCARDIAL PERFUSION WO LEXISCAN   LONG TERM MONITOR (3-14 DAYS)   EKG 12-Lead   PCV ECHOCARDIOGRAM COMPLETE    There are no Patient Instructions on file for this visit.   --Continue cardiac medications as reconciled in final medication list. --Return in about 6 weeks (around 09/22/2022) for  Follow up, A. fib, Review test results. or sooner if needed. --Continue follow-up with your primary care physician regarding the management of your other chronic comorbid conditions.  Patient's questions and concerns were addressed to her satisfaction. She voices understanding of the instructions provided during this encounter.   This note was created using a voice recognition software as a result there may be grammatical errors inadvertently enclosed that do not reflect the nature of this encounter. Every attempt is made to correct such errors.  Tessa Lerner, [place], [facility]  Pager:  [phone] Office: [phone]

## 2022-08-12 LAB — BASIC METABOLIC PANEL
BUN/Creatinine Ratio: 13 (ref 12–28)
BUN: 15 mg/dL (ref 8–27)
CO2: 26 mmol/L (ref 20–29)
Calcium: 10.3 mg/dL (ref 8.7–10.3)
Chloride: 97 mmol/L (ref 96–106)
Creatinine, Ser: 1.13 mg/dL — ABNORMAL HIGH (ref 0.57–1.00)
Glucose: 112 mg/dL — ABNORMAL HIGH (ref 70–99)
Potassium: 4.8 mmol/L (ref 3.5–5.2)
Sodium: 138 mmol/L (ref 134–144)
eGFR: 52 mL/min/{1.73_m2} — ABNORMAL LOW (ref >59)

## 2022-08-12 LAB — HEMOGLOBIN AND HEMATOCRIT, BLOOD
Hematocrit: 43.6 % (ref 34.0–46.6)
Hemoglobin: 15.5 g/dL (ref 11.1–15.9)

## 2022-08-12 LAB — MAGNESIUM: Magnesium: 1.8 mg/dL (ref 1.6–2.3)

## 2022-08-12 MED ORDER — RIVAROXABAN 15 MG PO TABS: 15.0000 mg | ORAL_TABLET | Freq: Every day | ORAL | 0 refills | Status: DC

## 2022-08-18 ENCOUNTER — Encounter: Payer: Self-pay | Admitting: *Deleted

## 2022-08-18 ENCOUNTER — Ambulatory Visit: Payer: Medicare PPO | Admitting: *Deleted

## 2022-08-18 DIAGNOSIS — R293 Abnormal posture: Secondary | ICD-10-CM | POA: Diagnosis not present

## 2022-08-18 DIAGNOSIS — M62830 Muscle spasm of back: Secondary | ICD-10-CM | POA: Diagnosis not present

## 2022-08-18 DIAGNOSIS — M546 Pain in thoracic spine: Secondary | ICD-10-CM | POA: Diagnosis not present

## 2022-08-18 NOTE — Therapy (Signed)
OUTPATIENT PHYSICAL THERAPY THORACIC TREATMENT   Patient Name: Theresa Reese MRN: [id number] DOB:1950-11-29, 72 y.o., female Today's Date: 08/18/2022  END OF SESSION:  PT End of Session - 08/18/22 1304     Visit Number 15    Number of Visits 18    Date for PT Re-Evaluation 08/18/22    Authorization Type FOTO.    PT Start Time 1301              Past Medical History:  Diagnosis Date   GERD (gastroesophageal reflux disease)    Hypercholesteremia    Hypertension    Hypothyroid    Insomnia    Left knee DJD    Left shoulder pain    Lymphocytic colitis    Past Surgical History:  Procedure Laterality Date   BACK SURGERY     CESAREAN SECTION  1987   CESAREAN SECTION  1990   COLONOSCOPY     EYE SURGERY Bilateral    cataract   JOINT REPLACEMENT Left    KNEE ARTHROSCOPY W/ MENISCECTOMY Left 2014   Dr Shelle Iron   low back surgery  1997   Dr Jule Ser   low back surgery  1994   Dr Elesa Hacker   RETINAL DETACHMENT SURGERY Right 2013   SHOULDER ARTHROSCOPY WITH ROTATOR CUFF REPAIR Left 05/22/2019   Procedure: LEFT SHOULDER ARTHROSCOPY WITH ROTATOR CUFF REPAIR;  Surgeon: Bjorn Pippin, MD;  Location: [facility];  Service: Orthopedics;  Laterality: Left;   SHOULDER ARTHROSCOPY WITH SUBACROMIAL DECOMPRESSION AND BICEP TENDON REPAIR Left 05/22/2019   Procedure: LEFT SHOULDER ARTHROSCOPY  DEBRIDEMENT WITH SUBACROMIAL DECOMPRESSION AND BICEP TENODESIS;  Surgeon: Bjorn Pippin, MD;  Location: [facility];  Service: Orthopedics;  Laterality: Left;   TOTAL KNEE ARTHROPLASTY Left 09/09/2013   Procedure: LEFT TOTAL KNEE ARTHROPLASTY;  Surgeon: Nilda Simmer, MD;  Location: [facility];  Service: Orthopedics;  Laterality: Left;   Patient Active Problem List   Diagnosis Date Noted   DJD (degenerative joint disease) of knee 09/09/2013   Insomnia    Depression with anxiety    Hypothyroid    Lymphocytic colitis    Left knee DJD     REFERRING PROVIDER: Gweneth Dimitri  MD  REFERRING DIAG: Pain in thoracic spine.  Rationale for Evaluation and Treatment: Rehabilitation  THERAPY DIAG:  Abnormal posture  Muscle spasm of back  Pain in thoracic spine  ONSET DATE: 12/22/2021  SUBJECTIVE:  SUBJECTIVE STATEMENT: Not doing well  still due to sinus problems. No energy.Vertigo last week. Pain is mainly LT side at UT and base of neck 2-3/10 intermittent pain with certain mvmnts  PERTINENT HISTORY:  HTN, osteopenia, left shoulder surgery, low back surgery and left knee surgery.  PAIN:  Are you having pain? Yes: NPRS scale: 2-3/10 Pain location: Mid-back, left > right. Pain description: As above. Aggravating factors: As above. Relieving factors: As above.  PRECAUTIONS: Other: Osteopenia.  WEIGHT BEARING RESTRICTIONS: No  FALLS:  Has patient fallen in last 6 months? No  LIVING ENVIRONMENT: Lives in: House/apartment Has following equipment at home: None  PLOF: Independent  PATIENT GOALS: Decrease pain.  Lift grandson without a lot less pain.   OBJECTIVE:   DIAGNOSTIC FINDINGS: X-ray IMPRESSION: 1. Osteopenia without evidence of thoracic spinal compression fracture. 2. Mild/moderate thoracic kyphosis and slight dextroscoliosis. 3. 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  PATIENT SURVEYS:  FOTO 42.   POSTURE: rounded shoulders, forward head, and increased thoracic kyphosis  PALPATION: Patient c/o palpable tenderness from ~T2 to T7 over left scapular musculature and to a lesser degree on the right.  UE ROM:  Bilateral UE AROM is WFL.  UE STRENGTH:  Normal bilateral UE strength.  DTR'S:  Normal bilateral UE DTR's.   GAIT: WNL.  TODAY'S TREATMENT:                                                                                                                               DATE:                                                       08/18/22   Combo e'stim/US at 1.50 W.CM2 x 12 minutes  to bil UT's STW/M x 26 minutes  to LT side UT, levator, and cervical paras with ischemic release technique utilized. HMP and IFC at 80-150 Hz on 40% scan x 15 minutes in sitting   Patient tolerated treatment without complaint with normal modality response following removal of modality.     ASSESSMENT:  CLINICAL IMPRESSION:  Patient arrived today not feeling well due to sinus infection still, but doing better with decreased neck and Utrap pain 2-3/10.   She is still with pain Her Cervical rotation motion has improved to 75 degrees RT and 70 degrees LT. She has met 2 out of 3 LTG's at this point. Good TP release again today with STW and TPR LT side. LTG #3 On going due to pain.   OBJECTIVE IMPAIRMENTS: decreased activity tolerance, increased muscle spasms, postural dysfunction, and pain.   ACTIVITY LIMITATIONS: carrying, lifting, and standing  PARTICIPATION LIMITATIONS: meal prep, cleaning, and laundry  REHAB POTENTIAL: Good  CLINICAL DECISION MAKING: Stable/uncomplicated  EVALUATION COMPLEXITY: Low  GOALS:  LONG TERM GOALS: Target date: 06/16/22  Ind with an HEP.  Goal status: MET  2.  Lift grandson with pain not exceeding 2-3/10.  Goal status: MET  3.  Perform ADL's with pain not exceeding 2-3/10.  Goal status: Partially met   4-5/10 some days    PLAN:  PT FREQUENCY: 2x/week  PT DURATION: 6 weeks  PLANNED INTERVENTIONS: Therapeutic exercises, Therapeutic activity, Patient/Family education, Self Care, Dry Needling, Electrical stimulation, Cryotherapy, Moist heat, Ultrasound, and Manual therapy.  PLAN FOR NEXT SESSION: Combo e'stim/US, STW/M, postural exercises, scapular exercises.   Treyten Monestime,[name], PTA 08/18/2022, 2:15 PM

## 2022-08-25 ENCOUNTER — Encounter: Payer: Medicare PPO | Admitting: *Deleted

## 2022-08-25 ENCOUNTER — Ambulatory Visit: Payer: Medicare PPO

## 2022-08-25 DIAGNOSIS — R000 Tachycardia, unspecified: Secondary | ICD-10-CM

## 2022-08-25 DIAGNOSIS — I480 Paroxysmal atrial fibrillation: Secondary | ICD-10-CM

## 2022-08-25 DIAGNOSIS — R072 Precordial pain: Secondary | ICD-10-CM | POA: Diagnosis not present

## 2022-08-31 NOTE — Progress Notes (Signed)
Patient informed. She is felling much better. Thyroid readings are returning to normal, Bs reading are normal again. She will keep a record of bp readings.

## 2022-09-01 ENCOUNTER — Ambulatory Visit: Payer: Medicare PPO | Attending: Family Medicine | Admitting: *Deleted

## 2022-09-01 ENCOUNTER — Encounter: Payer: Self-pay | Admitting: *Deleted

## 2022-09-01 DIAGNOSIS — R293 Abnormal posture: Secondary | ICD-10-CM | POA: Insufficient documentation

## 2022-09-01 DIAGNOSIS — M62830 Muscle spasm of back: Secondary | ICD-10-CM | POA: Insufficient documentation

## 2022-09-01 DIAGNOSIS — M546 Pain in thoracic spine: Secondary | ICD-10-CM | POA: Diagnosis not present

## 2022-09-01 DIAGNOSIS — E039 Hypothyroidism, unspecified: Secondary | ICD-10-CM | POA: Diagnosis not present

## 2022-09-01 NOTE — Therapy (Signed)
OUTPATIENT PHYSICAL THERAPY THORACIC TREATMENT   Patient Name: Theresa Reese MRN: [id number] DOB:January 13, 1951, 72 y.o., female Today's Date: 09/01/2022  END OF SESSION:  PT End of Session - 09/01/22 1303     Visit Number 17    Number of Visits 18    Date for PT Re-Evaluation 08/18/22    Authorization Type FOTO.    PT Start Time 1303    PT Stop Time 1359    PT Time Calculation (min) 56 min              Past Medical History:  Diagnosis Date   GERD (gastroesophageal reflux disease)    Hypercholesteremia    Hypertension    Hypothyroid    Insomnia    Left knee DJD    Left shoulder pain    Lymphocytic colitis    Past Surgical History:  Procedure Laterality Date   BACK SURGERY     CESAREAN SECTION  1987   CESAREAN SECTION  1990   COLONOSCOPY     EYE SURGERY Bilateral    cataract   JOINT REPLACEMENT Left    KNEE ARTHROSCOPY W/ MENISCECTOMY Left 2014   Dr Shelle Iron   low back surgery  1997   Dr Jule Ser   low back surgery  1994   Dr Elesa Hacker   RETINAL DETACHMENT SURGERY Right 2013   SHOULDER ARTHROSCOPY WITH ROTATOR CUFF REPAIR Left 05/22/2019   Procedure: LEFT SHOULDER ARTHROSCOPY WITH ROTATOR CUFF REPAIR;  Surgeon: Bjorn Pippin, MD;  Location: [facility];  Service: Orthopedics;  Laterality: Left;   SHOULDER ARTHROSCOPY WITH SUBACROMIAL DECOMPRESSION AND BICEP TENDON REPAIR Left 05/22/2019   Procedure: LEFT SHOULDER ARTHROSCOPY  DEBRIDEMENT WITH SUBACROMIAL DECOMPRESSION AND BICEP TENODESIS;  Surgeon: Bjorn Pippin, MD;  Location: [facility];  Service: Orthopedics;  Laterality: Left;   TOTAL KNEE ARTHROPLASTY Left 09/09/2013   Procedure: LEFT TOTAL KNEE ARTHROPLASTY;  Surgeon: Nilda Simmer, MD;  Location: [facility];  Service: Orthopedics;  Laterality: Left;   Patient Active Problem List   Diagnosis Date Noted   DJD (degenerative joint disease) of knee 09/09/2013   Insomnia    Depression with anxiety    Hypothyroid    Lymphocytic colitis     Left knee DJD     REFERRING PROVIDER: Gweneth Dimitri MD  REFERRING DIAG: Pain in thoracic spine.  Rationale for Evaluation and Treatment: Rehabilitation  THERAPY DIAG:  Abnormal posture  Pain in thoracic spine  Muscle spasm of back  ONSET DATE: 12/22/2021  SUBJECTIVE:  SUBJECTIVE STATEMENT:  Doing much better with sinus problems gone, but today my neck is very flared up 7-8/10   PERTINENT HISTORY: HTN, osteopenia, left shoulder surgery, low back surgery and left knee surgery.  PAIN:  Are you having pain? Yes: NPRS scale: 2-3/10 Pain location: Mid-back, left > right. Pain description: As above. Aggravating factors: As above. Relieving factors: As above.  PRECAUTIONS: Other: Osteopenia.  WEIGHT BEARING RESTRICTIONS: No  FALLS:  Has patient fallen in last 6 months? No  LIVING ENVIRONMENT: Lives in: House/apartment Has following equipment at home: None  PLOF: Independent  PATIENT GOALS: Decrease pain.  Lift grandson without a lot less pain.   OBJECTIVE:   DIAGNOSTIC FINDINGS: X-ray IMPRESSION: 1. Osteopenia without evidence of thoracic spinal compression fracture. 2. Mild/moderate thoracic kyphosis and slight dextroscoliosis. 3. 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  PATIENT SURVEYS:  FOTO 42.   POSTURE: rounded shoulders, forward head, and increased thoracic kyphosis  PALPATION: Patient c/o palpable tenderness from ~T2 to T7 over left scapular musculature and to a lesser degree on the right.  UE ROM:  Bilateral UE AROM is WFL.  UE STRENGTH:  Normal bilateral UE strength.  DTR'S:  Normal bilateral UE DTR's.   GAIT: WNL.  TODAY'S TREATMENT:                                                                                                                               DATE:                                                       09/01/22   Combo e'stim/US at 1.50 W.CM2 x 10 minutes  to LT side cerv. paras STW/M x 29 minutes  to LT side UT, levator, and cervical paras with ischemic release technique and manual traction supine HMP and IFC at 80-150 Hz on 40% scan x 15 minutes in supine  Patient tolerated treatment without complaint with normal modality response following removal of modality.     ASSESSMENT:  CLINICAL IMPRESSION:  Patient arrived today not feeling well due the LT side of her neck locking up and preventing cervical rotation without pain. Rx focused on [place]   and STW to LT side cerv. Paras as well as manual traction and IFC with HMP. Decreased pain and increased ROM after Rx.    OBJECTIVE IMPAIRMENTS: decreased activity tolerance, increased muscle spasms, postural dysfunction, and pain.   ACTIVITY LIMITATIONS: carrying, lifting, and standing  PARTICIPATION LIMITATIONS: meal prep, cleaning, and laundry  REHAB POTENTIAL: Good  CLINICAL DECISION MAKING: Stable/uncomplicated  EVALUATION COMPLEXITY: Low   GOALS:  LONG TERM GOALS: Target date: 06/16/22  Ind with an HEP.  Goal status: MET  2.  Lift grandson with pain not exceeding 2-3/10.  Goal status: MET  3.  Perform ADL's with pain not exceeding 2-3/10.  Goal status: Partially met   4-5/10 some days    PLAN:  PT FREQUENCY: 2x/week  PT DURATION: 6 weeks  PLANNED INTERVENTIONS: Therapeutic exercises, Therapeutic activity, Patient/Family education, Self Care, Dry Needling, Electrical stimulation, Cryotherapy, Moist heat, Ultrasound, and Manual therapy.  PLAN FOR NEXT SESSION: Combo e'stim/US, STW/M, postural exercises, scapular exercises.   Elfrida Pixley,[name], PTA 09/01/2022, 6:06 PM

## 2022-09-08 ENCOUNTER — Ambulatory Visit: Payer: Medicare PPO | Admitting: *Deleted

## 2022-09-08 DIAGNOSIS — M62830 Muscle spasm of back: Secondary | ICD-10-CM | POA: Diagnosis not present

## 2022-09-08 DIAGNOSIS — M546 Pain in thoracic spine: Secondary | ICD-10-CM | POA: Diagnosis not present

## 2022-09-08 DIAGNOSIS — R293 Abnormal posture: Secondary | ICD-10-CM

## 2022-09-08 NOTE — Therapy (Addendum)
OUTPATIENT PHYSICAL THERAPY THORACIC TREATMENT   Patient Name: Theresa Reese MRN: [id number] DOB:12-Dec-1950, 72 y.o., female Today's Date: 09/08/2022  END OF SESSION:  PT End of Session - 09/08/22 1530     Visit Number 18    Number of Visits 18    Date for PT Re-Evaluation 08/18/22    PT Start Time 1311    PT Stop Time 1400    PT Time Calculation (min) 49 min               Past Medical History:  Diagnosis Date   GERD (gastroesophageal reflux disease)    Hypercholesteremia    Hypertension    Hypothyroid    Insomnia    Left knee DJD    Left shoulder pain    Lymphocytic colitis    Past Surgical History:  Procedure Laterality Date   BACK SURGERY     CESAREAN SECTION  1987   CESAREAN SECTION  1990   COLONOSCOPY     EYE SURGERY Bilateral    cataract   JOINT REPLACEMENT Left    KNEE ARTHROSCOPY W/ MENISCECTOMY Left 2014   Dr Shelle Iron   low back surgery  1997   Dr Jule Ser   low back surgery  1994   Dr Elesa Hacker   RETINAL DETACHMENT SURGERY Right 2013   SHOULDER ARTHROSCOPY WITH ROTATOR CUFF REPAIR Left 05/22/2019   Procedure: LEFT SHOULDER ARTHROSCOPY WITH ROTATOR CUFF REPAIR;  Surgeon: Bjorn Pippin, MD;  Location: [facility];  Service: Orthopedics;  Laterality: Left;   SHOULDER ARTHROSCOPY WITH SUBACROMIAL DECOMPRESSION AND BICEP TENDON REPAIR Left 05/22/2019   Procedure: LEFT SHOULDER ARTHROSCOPY  DEBRIDEMENT WITH SUBACROMIAL DECOMPRESSION AND BICEP TENODESIS;  Surgeon: Bjorn Pippin, MD;  Location: [facility];  Service: Orthopedics;  Laterality: Left;   TOTAL KNEE ARTHROPLASTY Left 09/09/2013   Procedure: LEFT TOTAL KNEE ARTHROPLASTY;  Surgeon: Nilda Simmer, MD;  Location: [facility];  Service: Orthopedics;  Laterality: Left;   Patient Active Problem List   Diagnosis Date Noted   DJD (degenerative joint disease) of knee 09/09/2013   Insomnia    Depression with anxiety    Hypothyroid    Lymphocytic colitis    Left knee DJD     REFERRING  PROVIDER: Gweneth Dimitri MD  REFERRING DIAG: Pain in thoracic spine.  Rationale for Evaluation and Treatment: Rehabilitation  THERAPY DIAG:  Abnormal posture  Pain in thoracic spine  Muscle spasm of back  ONSET DATE: 12/22/2021  SUBJECTIVE:  SUBJECTIVE STATEMENT:  Doing much better after last Rx and 2-3/10 pain   PERTINENT HISTORY: HTN, osteopenia, left shoulder surgery, low back surgery and left knee surgery.  PAIN:  Are you having pain? Yes: NPRS scale: 2-3/10 Pain location: Mid-back, left > right. Pain description: As above. Aggravating factors: As above. Relieving factors: As above.  PRECAUTIONS: Other: Osteopenia.  WEIGHT BEARING RESTRICTIONS: No  FALLS:  Has patient fallen in last 6 months? No  LIVING ENVIRONMENT: Lives in: House/apartment Has following equipment at home: None  PLOF: Independent  PATIENT GOALS: Decrease pain.  Lift grandson without a lot less pain.   OBJECTIVE:   DIAGNOSTIC FINDINGS: X-ray IMPRESSION: 1. Osteopenia without evidence of thoracic spinal compression fracture. 2. Mild/moderate thoracic kyphosis and slight dextroscoliosis. 3. 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  PATIENT SURVEYS:  FOTO 42.   POSTURE: rounded shoulders, forward head, and increased thoracic kyphosis  PALPATION: Patient c/o palpable tenderness from ~T2 to T7 over left scapular musculature and to a lesser degree on the right.  UE ROM:  Bilateral UE AROM is WFL.  UE STRENGTH:  Normal bilateral UE strength.  DTR'S:  Normal bilateral UE DTR's.   GAIT: WNL.  TODAY'S TREATMENT:                                                                                                                              DATE:                                                        09/01/22   Combo e'stim/US at 1.50 W.CM2 x 12 minutes  to LT side cerv. Paras/UT and levator mm STW/M x 15 minutes  to LT side UT, levator, and cervical paras with ischemic release technique and manual traction supine HMP and IFC at 80-150 Hz on 40% scan x 15 minutes in supine  Patient tolerated treatment without complaint with normal modality response following removal of modality.     ASSESSMENT:  CLINICAL IMPRESSION: PT ARRIVED TODAY DOING MUCH BETTER WITH 2-3/10 PAIN. RX FOCUSED ON DECREASING MM TIGHTNESS AND PAIN LT SIDE UT, LEVATOR,AND CERVICAL PARAS. PT HAS CURRENTLY MET ALL LTG'S AND WILL BE DC.   OBJECTIVE IMPAIRMENTS: decreased activity tolerance, increased muscle spasms, postural dysfunction, and pain.   ACTIVITY LIMITATIONS: carrying, lifting, and standing  PARTICIPATION LIMITATIONS: meal prep, cleaning, and laundry  REHAB POTENTIAL: Good  CLINICAL DECISION MAKING: Stable/uncomplicated  EVALUATION COMPLEXITY: Low   GOALS:  LONG TERM GOALS: Target date: 06/16/22  Ind with an HEP.  Goal status: MET  2.  Lift grandson with pain not exceeding 2-3/10.  Goal status: MET  3.  Perform ADL's with pain not exceeding 2-3/10.  Goal status: MET    PLAN:  PT FREQUENCY: 2x/week  PT DURATION: 6 weeks  PLANNED INTERVENTIONS: Therapeutic exercises, Therapeutic activity, Patient/Family education, Self Care, Dry Needling, Electrical stimulation, Cryotherapy, Moist heat, Ultrasound, and Manual therapy.  PLAN FOR NEXT SESSION: DC TO HEP.   Nashua Homewood,[name], PTA 09/08/2022, 3:37 PM

## 2022-09-09 ENCOUNTER — Other Ambulatory Visit: Payer: Medicare PPO

## 2022-09-13 DIAGNOSIS — I480 Paroxysmal atrial fibrillation: Secondary | ICD-10-CM | POA: Diagnosis not present

## 2022-09-13 DIAGNOSIS — R000 Tachycardia, unspecified: Secondary | ICD-10-CM | POA: Diagnosis not present

## 2022-09-19 DIAGNOSIS — J3081 Allergic rhinitis due to animal (cat) (dog) hair and dander: Secondary | ICD-10-CM | POA: Diagnosis not present

## 2022-09-19 DIAGNOSIS — Z880 Allergy status to penicillin: Secondary | ICD-10-CM | POA: Diagnosis not present

## 2022-09-19 DIAGNOSIS — J3089 Other allergic rhinitis: Secondary | ICD-10-CM | POA: Diagnosis not present

## 2022-09-19 DIAGNOSIS — R052 Subacute cough: Secondary | ICD-10-CM | POA: Diagnosis not present

## 2022-09-20 ENCOUNTER — Ambulatory Visit: Payer: Medicare PPO

## 2022-09-20 DIAGNOSIS — I480 Paroxysmal atrial fibrillation: Secondary | ICD-10-CM | POA: Diagnosis not present

## 2022-09-20 DIAGNOSIS — R072 Precordial pain: Secondary | ICD-10-CM

## 2022-09-27 ENCOUNTER — Ambulatory Visit: Payer: Medicare PPO | Admitting: Cardiology

## 2022-09-29 DIAGNOSIS — Z23 Encounter for immunization: Secondary | ICD-10-CM | POA: Diagnosis not present

## 2022-09-29 DIAGNOSIS — I480 Paroxysmal atrial fibrillation: Secondary | ICD-10-CM | POA: Diagnosis not present

## 2022-09-29 DIAGNOSIS — Z0000 Encounter for general adult medical examination without abnormal findings: Secondary | ICD-10-CM | POA: Diagnosis not present

## 2022-09-29 DIAGNOSIS — Z01419 Encounter for gynecological examination (general) (routine) without abnormal findings: Secondary | ICD-10-CM | POA: Diagnosis not present

## 2022-09-29 DIAGNOSIS — N952 Postmenopausal atrophic vaginitis: Secondary | ICD-10-CM | POA: Diagnosis not present

## 2022-09-29 DIAGNOSIS — D6869 Other thrombophilia: Secondary | ICD-10-CM | POA: Diagnosis not present

## 2022-09-29 DIAGNOSIS — I10 Essential (primary) hypertension: Secondary | ICD-10-CM | POA: Diagnosis not present

## 2022-09-29 DIAGNOSIS — E039 Hypothyroidism, unspecified: Secondary | ICD-10-CM | POA: Diagnosis not present

## 2022-09-29 DIAGNOSIS — E782 Mixed hyperlipidemia: Secondary | ICD-10-CM | POA: Diagnosis not present

## 2022-09-30 DIAGNOSIS — R052 Subacute cough: Secondary | ICD-10-CM | POA: Diagnosis not present

## 2022-09-30 DIAGNOSIS — J3081 Allergic rhinitis due to animal (cat) (dog) hair and dander: Secondary | ICD-10-CM | POA: Diagnosis not present

## 2022-09-30 DIAGNOSIS — J300 Vasomotor rhinitis: Secondary | ICD-10-CM | POA: Diagnosis not present

## 2022-09-30 DIAGNOSIS — Z880 Allergy status to penicillin: Secondary | ICD-10-CM | POA: Diagnosis not present

## 2022-10-06 ENCOUNTER — Ambulatory Visit: Payer: Medicare PPO | Admitting: Cardiology

## 2022-10-06 ENCOUNTER — Encounter: Payer: Self-pay | Admitting: Cardiology

## 2022-10-06 VITALS — BP 144/84 | HR 86 | Resp 16 | Ht 66.0 in | Wt 145.4 lb

## 2022-10-06 DIAGNOSIS — E782 Mixed hyperlipidemia: Secondary | ICD-10-CM | POA: Diagnosis not present

## 2022-10-06 DIAGNOSIS — I10 Essential (primary) hypertension: Secondary | ICD-10-CM

## 2022-10-06 DIAGNOSIS — R072 Precordial pain: Secondary | ICD-10-CM | POA: Diagnosis not present

## 2022-10-06 DIAGNOSIS — I480 Paroxysmal atrial fibrillation: Secondary | ICD-10-CM

## 2022-10-06 DIAGNOSIS — Z7901 Long term (current) use of anticoagulants: Secondary | ICD-10-CM

## 2022-10-06 MED ORDER — METOPROLOL SUCCINATE ER 25 MG PO TB24: 25.0000 mg | ORAL_TABLET | Freq: Every day | ORAL | 0 refills | Status: DC

## 2022-10-06 NOTE — Progress Notes (Signed)
ID:  Theresa Reese, DOB Jul 28, 1950, MRN [id number]  PCP:  Gweneth Dimitri, MD  Cardiologist:  Tessa Lerner, DO, [facility] (established care Aug 11, 2022)  Date: 10/06/22 Last Office Visit: 08/11/2022  Chief Complaint  Patient presents with   Follow-up   Atrial Fibrillation    HPI  Theresa Reese is a 72 y.o. Caucasian female whose past medical history and cardiovascular risk factors include: Hypothyroidism, hyperlipidemia, hypertension, major depression with anxiety,former smoker.   She is accompanied by her significant other Edwinna Areola and patient provides verbal consent with having him present during today's encounter.  Patient was referred to the practice for evaluation of tachycardia/atrial fibrillation.  In the past she has had episodes of tachycardia and feels that her heart is jumping out of her chest.  She bought a Psychologist, educational from a friend which illustrated the underlying rhythm to be atrial fibrillation.  This was again cooperated well with her apple iWatch.  At the last office visit patient was started on metoprolol for rate control strategy and is on Xarelto for thromboembolic prophylaxis.  She is tolerated the medications well and does not endorse evidence of bleeding.  Zio patch was ordered to evaluate for A-fib burden and during the monitoring period no atrial fibrillation was detected.  During the prior office visits she also endorsed chest pain and given her risk factors that shared decision was to proceed with echo and stress test.  Results reviewed with her in detail and noted below for further reference.  Since last office visit she has not had any recurrence of precordial pain.  FUNCTIONAL STATUS: Enjoys gardening, takes care of grand kids, No structured exercise program or daily routine.   ALLERGIES: Allergies  Allergen Reactions   Cephalexin Other (See Comments)    Yeast infection   Clarithromycin Other (See Comments)   Erythromycin Nausea And Vomiting and  Other (See Comments)    Diarrhea   Hospitalized for dehydration   Other     Other Reaction(s): Not available    MEDICATION LIST PRIOR TO VISIT: Current Meds  Medication Sig   albuterol (VENTOLIN HFA) 108 (90 Base) MCG/ACT inhaler Inhale into the lungs every 6 (six) hours as needed for wheezing or shortness of breath.   atorvastatin (LIPITOR) 20 MG tablet Take 20 mg by mouth daily.   Azelastine HCl 137 MCG/SPRAY SOLN Place 1 spray into both nostrils 2 (two) times daily.   b complex vitamins tablet Take 1 tablet by mouth daily.   buPROPion (WELLBUTRIN XL) 150 MG 24 hr tablet Take 150 mg by mouth every morning.   Calcium-Magnesium-Vitamin D (CALCIUM 500 PO) Take 500 mg by mouth daily.   Cholecalciferol (VITAMIN D) 2000 UNITS CAPS Take 2,000 Units by mouth daily.   conjugated estrogens (PREMARIN) vaginal cream Place 1 Applicatorful vaginally daily.   fluticasone (FLONASE) 50 MCG/ACT nasal spray Place into both nostrils daily.   hydrochlorothiazide (HYDRODIURIL) 12.5 MG tablet Take 12.5 mg by mouth every morning.   levothyroxine (SYNTHROID, LEVOTHROID) 112 MCG tablet Take 112 mcg by mouth daily before breakfast.   lisinopril (ZESTRIL) 20 MG tablet Take 20 mg by mouth daily.   metoprolol succinate (TOPROL XL) 25 MG 24 hr tablet Take 1 tablet (25 mg total) by mouth daily. Hold if systolic blood pressure (top number) less than 100 mmHg or pulse less than 55 bpm.   Multiple Vitamin (MULTIVITAMIN WITH MINERALS) TABS tablet Take 1 tablet by mouth daily.   RESTASIS 0.05 % ophthalmic emulsion Place  1 drop into both eyes 2 (two) times daily.   rivaroxaban (XARELTO) 15 MG TABS tablet Take 1 tablet (15 mg total) by mouth daily with supper.   vitamin C (ASCORBIC ACID) 500 MG tablet Take 500 mg by mouth daily.     PAST MEDICAL HISTORY: Past Medical History:  Diagnosis Date   GERD (gastroesophageal reflux disease)    Hypercholesteremia    Hypertension    Hypothyroid    Insomnia    Left knee DJD     Left shoulder pain    Lymphocytic colitis     PAST SURGICAL HISTORY: Past Surgical History:  Procedure Laterality Date   BACK SURGERY     CESAREAN SECTION  1987   CESAREAN SECTION  1990   COLONOSCOPY     EYE SURGERY Bilateral    cataract   JOINT REPLACEMENT Left    KNEE ARTHROSCOPY W/ MENISCECTOMY Left 2014   Dr Shelle Iron   low back surgery  1997   Dr Jule Ser   low back surgery  1994   Dr Elesa Hacker   RETINAL DETACHMENT SURGERY Right 2013   SHOULDER ARTHROSCOPY WITH ROTATOR CUFF REPAIR Left 05/22/2019   Procedure: LEFT SHOULDER ARTHROSCOPY WITH ROTATOR CUFF REPAIR;  Surgeon: Bjorn Pippin, MD;  Location: [facility];  Service: Orthopedics;  Laterality: Left;   SHOULDER ARTHROSCOPY WITH SUBACROMIAL DECOMPRESSION AND BICEP TENDON REPAIR Left 05/22/2019   Procedure: LEFT SHOULDER ARTHROSCOPY  DEBRIDEMENT WITH SUBACROMIAL DECOMPRESSION AND BICEP TENODESIS;  Surgeon: Bjorn Pippin, MD;  Location: [facility];  Service: Orthopedics;  Laterality: Left;   TOTAL KNEE ARTHROPLASTY Left 09/09/2013   Procedure: LEFT TOTAL KNEE ARTHROPLASTY;  Surgeon: Nilda Simmer, MD;  Location: [facility];  Service: Orthopedics;  Laterality: Left;    FAMILY HISTORY: The patient family history includes Heart attack in her father and mother; Heart disease in her mother; Hypertension in her father and mother; Stroke in her father.  SOCIAL HISTORY:  The patient  reports that she quit smoking about 52 years ago. Her smoking use included cigarettes. She started smoking about 62 years ago. She has a 10 pack-year smoking history. She has never used smokeless tobacco. She reports current alcohol use. She reports that she does not use drugs.  REVIEW OF SYSTEMS: Review of Systems  Cardiovascular:  Negative for chest pain, claudication, dyspnea on exertion, irregular heartbeat, leg swelling, near-syncope, orthopnea, palpitations, paroxysmal nocturnal dyspnea and syncope.  Respiratory:  Negative for  shortness of breath.   Hematologic/Lymphatic: Negative for bleeding problem.  Musculoskeletal:  Negative for muscle cramps and myalgias.  Neurological:  Negative for dizziness and light-headedness.    PHYSICAL EXAM:    10/06/2022    2:41 PM 08/11/2022   10:36 AM 05/22/2019    4:35 PM  Vitals with BMI  Height 5\' 6"  5\' 6"    Weight 145 lbs 6 oz 144 lbs   BMI 23.48 23.25   Systolic 144 153 332  Diastolic 84 90 74  Pulse 86 97 93    Physical Exam  Constitutional: No distress.  Age appropriate, hemodynamically stable.   Neck: No JVD present.  Cardiovascular: Normal rate, regular rhythm, S1 normal, S2 normal, intact distal pulses and normal pulses. Exam reveals no gallop, no S3 and no S4.  No murmur heard. Pulmonary/Chest: Effort normal and breath sounds normal. No stridor. She has no wheezes. She has no rales.  Abdominal: Soft. Bowel sounds are normal. She exhibits no distension. There is no abdominal tenderness.  Musculoskeletal:  General: No edema.     Cervical back: Neck supple.  Neurological: She is alert and oriented to person, place, and time. She has intact cranial nerves (2-12).  Skin: Skin is warm and moist.   CARDIAC DATABASE: EKG: Aug 11, 2022: Sinus rhythm, 96 bpm, ST depressions in the inferolateral leads, no prior EKGs available for comparison. October 06, 2022: Sinus rhythm, 79 bpm, without underlying ischemia or injury pattern.  Echocardiogram: 09/20/2022: Single Lead EKG Strip: Normal Sinus  Normal LV systolic function with visual EF 60-65%. Left ventricle cavity is normal in size. Normal left ventricular wall thickness. Normal global wall motion. Normal diastolic filling pattern, normal LAP.  Mild tricuspid regurgitation. No evidence of pulmonary hypertension. No prior study for comparison.    Stress Testing: Exercise Myoview stress test 08/25/2022: Exercise nuclear stress test was performed using Bruce protocol.  1 Day Rest and Stress  images. Hypertensive response to exercise: Yes (rest 150/88, peak 220/98) Exercise time 4 minutes 31 seconds on Bruce protocol, achieved 6.46 METS, 101% of age-predicted maximum heart rate (APMHR).  Stress ECG negative for ischemia. Normal myocardial perfusion without evidence of reversible ischemia. No obvious regional wall motion abnormalities and wall function/thickness preserved. Calculated LVEF 66%, visually hyperdynamic. No prior studies available for comparison.    Heart Catheterization: None   LABORATORY DATA: External Labs: Collected: July 15, 2022 provided by PCP. TSH 21.49.  Collected: April 01, 2022: Creatinine 0.87.  eGFR 72. Sodium 139, potassium 5.3, chloride 105, bicarb 27.   Collected: 08/27/2021. Total cholesterol 182, triglycerides 87, HDL 59, LDL 107, non-HDL 123  IMPRESSION:    ICD-10-CM   1. Paroxysmal atrial fibrillation (HCC)  I48.0 EKG 12-Lead    Hemoglobin and hematocrit, blood    Basic metabolic panel    metoprolol succinate (TOPROL XL) 25 MG 24 hr tablet    2. Long term (current) use of anticoagulants  Z79.01 Hemoglobin and hematocrit, blood    Basic metabolic panel    3. Benign hypertension  I10     4. Precordial pain  R07.2     5. Mixed hyperlipidemia  E78.2        RECOMMENDATIONS: ROZELL KETTLEWELL is a 72 y.o. Caucasian female whose past medical history and cardiac risk factors include: Hypothyroidism, hyperlipidemia, hypertension, major depression with anxiety,former smoker.  Paroxysmal atrial fibrillation (HCC) Rate control: Metoprolol. Rhythm control: N/A. Thromboembolic prophylaxis Xarelto. CHA2DS2-VASc SCORE is 3 which correlates to 3.2% risk of stroke per year (age, gender, hypertension).  Appreciates when she goes in and out of atrial fibrillation. Zio patch results reviewed with the patient-final report forthcoming.  No evidence of atrial fibrillation during the monitoring period.   Transition from Lopressor to Toprol-XL.    Patient is aware of having her hemoglobin and renal function checked every 6 months either with myself or PCP.   Risks, benefits, and alternatives to anticoagulation discussed with the patient and her friend at today's office visit.  Benign hypertension Office blood pressures are not well-controlled. I did share my concerns with regards to hypertensive response to exercise noted a recent stress test. However, home blood pressures are well-controlled based on the blood pressure log she provides.   Majority of the readings are less than 130 mmHg. Reemphasized the importance of low-salt diet.  Precordial pain Nonspecific abnormal electrocardiogram (ECG) (EKG) No recurrence of chest pain since last office visit. Echo and stress test results reviewed with her in detail and noted above for further reference. Reemphasized importance of improving her modifiable cardiovascular  risk factors.   Monitor for now.    Mixed hyperlipidemia Currently on atorvastatin.   She denies myalgia or other side effects. Most recent lipids dated June 2023, independently reviewed as noted above. Currently managed by primary care provider.  Labs from May 2024 independently reviewed as part of medical decision making at today's office visit.  Will see her back in December 2024 at the 19-month follow-up since initiation of anticoagulation and to recheck labs.  If she remains stable annual follow-up visits would be recommended with checking labs every 6 months either with myself or PCP.  Patient is agreeable with the plan of care and is thankful.   FINAL MEDICATION LIST END OF ENCOUNTER: Meds ordered this encounter  Medications   metoprolol succinate (TOPROL XL) 25 MG 24 hr tablet    Sig: Take 1 tablet (25 mg total) by mouth daily. Hold if systolic blood pressure (top number) less than 100 mmHg or pulse less than 55 bpm.    Dispense:  90 tablet    Refill:  0    Medications Discontinued During This Encounter   Medication Reason   metoprolol tartrate (LOPRESSOR) 25 MG tablet Change in therapy      Current Outpatient Medications:    albuterol (VENTOLIN HFA) 108 (90 Base) MCG/ACT inhaler, Inhale into the lungs every 6 (six) hours as needed for wheezing or shortness of breath., Disp: , Rfl:    atorvastatin (LIPITOR) 20 MG tablet, Take 20 mg by mouth daily., Disp: , Rfl:    Azelastine HCl 137 MCG/SPRAY SOLN, Place 1 spray into both nostrils 2 (two) times daily., Disp: , Rfl:    b complex vitamins tablet, Take 1 tablet by mouth daily., Disp: , Rfl:    buPROPion (WELLBUTRIN XL) 150 MG 24 hr tablet, Take 150 mg by mouth every morning., Disp: , Rfl:    Calcium-Magnesium-Vitamin D (CALCIUM 500 PO), Take 500 mg by mouth daily., Disp: , Rfl:    Cholecalciferol (VITAMIN D) 2000 UNITS CAPS, Take 2,000 Units by mouth daily., Disp: , Rfl:    conjugated estrogens (PREMARIN) vaginal cream, Place 1 Applicatorful vaginally daily., Disp: , Rfl:    fluticasone (FLONASE) 50 MCG/ACT nasal spray, Place into both nostrils daily., Disp: , Rfl:    hydrochlorothiazide (HYDRODIURIL) 12.5 MG tablet, Take 12.5 mg by mouth every morning., Disp: , Rfl:    levothyroxine (SYNTHROID, LEVOTHROID) 112 MCG tablet, Take 112 mcg by mouth daily before breakfast., Disp: , Rfl:    lisinopril (ZESTRIL) 20 MG tablet, Take 20 mg by mouth daily., Disp: , Rfl:    metoprolol succinate (TOPROL XL) 25 MG 24 hr tablet, Take 1 tablet (25 mg total) by mouth daily. Hold if systolic blood pressure (top number) less than 100 mmHg or pulse less than 55 bpm., Disp: 90 tablet, Rfl: 0   Multiple Vitamin (MULTIVITAMIN WITH MINERALS) TABS tablet, Take 1 tablet by mouth daily., Disp: , Rfl:    RESTASIS 0.05 % ophthalmic emulsion, Place 1 drop into both eyes 2 (two) times daily., Disp: , Rfl:    rivaroxaban (XARELTO) 15 MG TABS tablet, Take 1 tablet (15 mg total) by mouth daily with supper., Disp: 90 tablet, Rfl: 0   vitamin C (ASCORBIC ACID) 500 MG tablet, Take  500 mg by mouth daily., Disp: , Rfl:   Orders Placed This Encounter  Procedures   Hemoglobin and hematocrit, blood   Basic metabolic panel   EKG 12-Lead    There are no Patient Instructions on file for this  visit.   --Continue cardiac medications as reconciled in final medication list. --Return in about 5 months (around 02/27/2023) for Follow up, A. fib. or sooner if needed. --Continue follow-up with your primary care physician regarding the management of your other chronic comorbid conditions.  Patient's questions and concerns were addressed to her satisfaction. She voices understanding of the instructions provided during this encounter.   This note was created using a voice recognition software as a result there may be grammatical errors inadvertently enclosed that do not reflect the nature of this encounter. Every attempt is made to correct such errors.  Tessa Lerner, [place], [facility]  Pager:  [phone] Office: [phone]

## 2022-10-12 ENCOUNTER — Encounter: Payer: Self-pay | Admitting: Cardiology

## 2022-10-12 DIAGNOSIS — I480 Paroxysmal atrial fibrillation: Secondary | ICD-10-CM

## 2022-10-12 NOTE — Telephone Encounter (Signed)
From patient.

## 2022-10-13 MED ORDER — METOPROLOL SUCCINATE ER 25 MG PO TB24: 12.5000 mg | ORAL_TABLET | Freq: Every day | ORAL | Status: DC

## 2022-10-13 NOTE — Telephone Encounter (Signed)
Spoke to the patient over the phone.  She is asymptomatic with heart rates of 50-60 bpm.  But concerned as her iWatch alerts her.  Shared decision was to reduce Toprol-XL to 12.5 mg p.o. daily.  Xela Oregel Jefferson, DO, [facility]

## 2022-10-13 NOTE — Telephone Encounter (Signed)
Spoke to the patient over the phone.  She is asymptomatic with heart rates of 50-60 bpm.  But concerned as her iWatch alerts her.  Shared decision was to reduce Toprol-XL to 12.5 mg p.o. daily.  Theresa Lorio Jefferson, DO, [facility]

## 2022-10-26 DIAGNOSIS — I480 Paroxysmal atrial fibrillation: Secondary | ICD-10-CM | POA: Diagnosis not present

## 2022-10-26 DIAGNOSIS — R000 Tachycardia, unspecified: Secondary | ICD-10-CM | POA: Diagnosis not present

## 2022-11-05 ENCOUNTER — Other Ambulatory Visit: Payer: Self-pay | Admitting: Cardiology

## 2022-11-05 DIAGNOSIS — I480 Paroxysmal atrial fibrillation: Secondary | ICD-10-CM

## 2022-12-29 ENCOUNTER — Other Ambulatory Visit: Payer: Self-pay | Admitting: Family Medicine

## 2022-12-29 DIAGNOSIS — Z1231 Encounter for screening mammogram for malignant neoplasm of breast: Secondary | ICD-10-CM

## 2023-01-03 ENCOUNTER — Ambulatory Visit
Admission: RE | Admit: 2023-01-03 | Discharge: 2023-01-03 | Disposition: A | Discharge status: Home / Self Care | Payer: Medicare PPO | Source: Ambulatory Visit | Attending: Family Medicine | Admitting: Family Medicine

## 2023-01-03 DIAGNOSIS — Z1231 Encounter for screening mammogram for malignant neoplasm of breast: Secondary | ICD-10-CM | POA: Diagnosis not present

## 2023-01-23 DIAGNOSIS — L821 Other seborrheic keratosis: Secondary | ICD-10-CM | POA: Diagnosis not present

## 2023-01-23 DIAGNOSIS — L439 Lichen planus, unspecified: Secondary | ICD-10-CM | POA: Diagnosis not present

## 2023-01-23 DIAGNOSIS — L570 Actinic keratosis: Secondary | ICD-10-CM | POA: Diagnosis not present

## 2023-01-23 DIAGNOSIS — L601 Onycholysis: Secondary | ICD-10-CM | POA: Diagnosis not present

## 2023-02-07 ENCOUNTER — Other Ambulatory Visit: Payer: Self-pay | Admitting: Cardiology

## 2023-02-07 DIAGNOSIS — Z7901 Long term (current) use of anticoagulants: Secondary | ICD-10-CM | POA: Diagnosis not present

## 2023-02-07 DIAGNOSIS — I480 Paroxysmal atrial fibrillation: Secondary | ICD-10-CM | POA: Diagnosis not present

## 2023-02-08 LAB — BASIC METABOLIC PANEL
BUN/Creatinine Ratio: 8 — ABNORMAL LOW (ref 12–28)
BUN: 8 mg/dL (ref 8–27)
CO2: 25 mmol/L (ref 20–29)
Calcium: 9.6 mg/dL (ref 8.7–10.3)
Chloride: 106 mmol/L (ref 96–106)
Creatinine, Ser: 1.04 mg/dL — ABNORMAL HIGH (ref 0.57–1.00)
Glucose: 99 mg/dL (ref 70–99)
Potassium: 4.7 mmol/L (ref 3.5–5.2)
Sodium: 143 mmol/L (ref 134–144)
eGFR: 58 mL/min/{1.73_m2} — ABNORMAL LOW (ref >59)

## 2023-02-09 ENCOUNTER — Other Ambulatory Visit: Payer: Self-pay

## 2023-02-09 DIAGNOSIS — Z7901 Long term (current) use of anticoagulants: Secondary | ICD-10-CM

## 2023-02-09 DIAGNOSIS — I480 Paroxysmal atrial fibrillation: Secondary | ICD-10-CM

## 2023-02-14 ENCOUNTER — Telehealth: Payer: Self-pay | Admitting: Cardiology

## 2023-02-14 NOTE — Telephone Encounter (Signed)
Patient is requesting to speak with the nurse in regards to her lab results. Please advise.

## 2023-02-15 ENCOUNTER — Other Ambulatory Visit: Payer: Self-pay | Admitting: Cardiology

## 2023-02-15 DIAGNOSIS — I480 Paroxysmal atrial fibrillation: Secondary | ICD-10-CM

## 2023-02-15 NOTE — Telephone Encounter (Addendum)
Prescription refill request for Xarelto received.  Indication: Afib  Last office visit: 10/06/22 Odis Hollingshead)  Weight: 66kg Age: 72 Scr: 1.04 (02/07/23)  CrCl: 52.33ml/min  Per dosing criteria, current dose not appropriate. Will forward to PharmD for review.

## 2023-02-15 NOTE — Telephone Encounter (Signed)
Spoke with pt about confusion regarding why her H&H was not drawn when she went last week to have her BMP drawn. Explained to pt that the order had not been released so the lab wasn't able to see that the H&H had been ordered. Explained to pt that the order has now been released and at her earliest convenience she can go to a [facility] facility or our clinic to have the lab drawn. Pt stated she would go to a [facility] facility next week after the holiday. Pt told to contact us with any questions she may have.

## 2023-02-20 NOTE — Telephone Encounter (Signed)
Per PharmD, dose should be increased to 20mg  daily. Dose changed and new prescription sent to pharmacy.

## 2023-02-22 ENCOUNTER — Other Ambulatory Visit: Payer: Self-pay | Admitting: Cardiology

## 2023-02-22 DIAGNOSIS — I480 Paroxysmal atrial fibrillation: Secondary | ICD-10-CM

## 2023-03-01 ENCOUNTER — Other Ambulatory Visit: Payer: Self-pay | Admitting: Cardiology

## 2023-03-01 DIAGNOSIS — I480 Paroxysmal atrial fibrillation: Secondary | ICD-10-CM

## 2023-03-02 ENCOUNTER — Ambulatory Visit: Payer: Self-pay | Admitting: Cardiology

## 2023-03-02 NOTE — Telephone Encounter (Signed)
Refill was sent on 02/20/23 for 90 day supply. Dose was changed from 15mg  tablet to 20mg  tablet. See note on 02/15/23

## 2023-03-09 DIAGNOSIS — I480 Paroxysmal atrial fibrillation: Secondary | ICD-10-CM | POA: Diagnosis not present

## 2023-03-09 DIAGNOSIS — Z79899 Other long term (current) drug therapy: Secondary | ICD-10-CM | POA: Diagnosis not present

## 2023-03-09 DIAGNOSIS — N952 Postmenopausal atrophic vaginitis: Secondary | ICD-10-CM | POA: Diagnosis not present

## 2023-03-09 DIAGNOSIS — M542 Cervicalgia: Secondary | ICD-10-CM | POA: Diagnosis not present

## 2023-03-09 DIAGNOSIS — E039 Hypothyroidism, unspecified: Secondary | ICD-10-CM | POA: Diagnosis not present

## 2023-03-09 DIAGNOSIS — I10 Essential (primary) hypertension: Secondary | ICD-10-CM | POA: Diagnosis not present

## 2023-03-09 DIAGNOSIS — D6869 Other thrombophilia: Secondary | ICD-10-CM | POA: Diagnosis not present

## 2023-03-09 DIAGNOSIS — E782 Mixed hyperlipidemia: Secondary | ICD-10-CM | POA: Diagnosis not present

## 2023-03-09 DIAGNOSIS — F3340 Major depressive disorder, recurrent, in remission, unspecified: Secondary | ICD-10-CM | POA: Diagnosis not present

## 2023-03-09 DIAGNOSIS — M85851 Other specified disorders of bone density and structure, right thigh: Secondary | ICD-10-CM | POA: Diagnosis not present

## 2023-03-21 ENCOUNTER — Ambulatory Visit: Payer: Medicare PPO | Attending: Cardiology | Admitting: Cardiology

## 2023-03-21 ENCOUNTER — Encounter: Payer: Self-pay | Admitting: Cardiology

## 2023-03-21 VITALS — BP 168/80 | HR 71 | Ht 66.0 in | Wt 153.0 lb

## 2023-03-21 DIAGNOSIS — I480 Paroxysmal atrial fibrillation: Secondary | ICD-10-CM

## 2023-03-21 DIAGNOSIS — Z7901 Long term (current) use of anticoagulants: Secondary | ICD-10-CM | POA: Diagnosis not present

## 2023-03-21 DIAGNOSIS — E782 Mixed hyperlipidemia: Secondary | ICD-10-CM

## 2023-03-21 DIAGNOSIS — I10 Essential (primary) hypertension: Secondary | ICD-10-CM

## 2023-03-21 MED ORDER — HYDROCHLOROTHIAZIDE 25 MG PO TABS: 25.0000 mg | ORAL_TABLET | Freq: Every day | ORAL | 3 refills | Status: AC

## 2023-03-21 NOTE — Progress Notes (Signed)
 Cardiology Office Note:  .   Date:  03/21/2023  ID:  Theresa Reese, DOB 06-01-1950, MRN [id number] PCP:  Aisha Harvey, MD  Former Cardiology Providers: N/A [facility] Providers Cardiologist:  Madonna Large, DO , [facility] (established care May 2024) Electrophysiologist:  None  Click to update primary MD,subspecialty MD or APP then REFRESH:1}    Chief Complaint  Patient presents with   Follow-up    Afib, shortness of breath    History of Present Illness: Theresa Reese is a 72 y.o. Caucasian female whose past medical history and cardiovascular risk factors includes: Hypothyroidism, hyperlipidemia, hypertension, history of major depression with anxiety, former smoker.  Patient was referred to practice for evaluation of tachycardia/atrial fibrillation.  Prior to establishing care patient was using Kardia mobile to evaluate her underlying rhythm.  She was noted to have episodes of atrial fibrillation which were corroborated along with findings of the apple iWatch as well.  Patient has been on AV nodal blocking agents as well as anticoagulation for thromboembolic prophylaxis.  She is tolerated medical therapy well.  She is also undergone ischemic workup in the past due to concerns for chest pain.  She presents today for 70-month follow-up visit.  Over the last 6 months she denies anginal chest pain or heart failure symptoms. At times she feels tired and fatigued but overall at baseline. Her functional capacity remains stable over the last 6 months. At times she does experience shortness of breath with exertional activities. Home blood pressures usually range between 135-140 mmHg.  Review of Systems: .   Review of Systems  Constitutional: Positive for malaise/fatigue.  Cardiovascular:  Positive for dyspnea on exertion. Negative for chest pain, claudication, irregular heartbeat, leg swelling, near-syncope, orthopnea, palpitations, paroxysmal nocturnal dyspnea and syncope.   Respiratory:  Negative for shortness of breath.   Hematologic/Lymphatic: Negative for bleeding problem.    Studies Reviewed:   EKG: October 06, 2022: Sinus rhythm, 79 bpm, without underlying ischemia or injury pattern.  Echocardiogram: 09/20/2022: Single Lead EKG Strip: Normal Sinus  Normal LV systolic function with visual EF 60-65%. Left ventricle cavity is normal in size. Normal left ventricular wall thickness. Normal global wall motion. Normal diastolic filling pattern, normal LAP.  Mild tricuspid regurgitation. No evidence of pulmonary hypertension. No prior study for comparison.     Stress Testing: Exercise Myoview  stress test 08/25/2022: Exercise nuclear stress test was performed using Bruce protocol.  1 Day Rest and Stress images. Hypertensive response to exercise: Yes (rest 150/88, peak 220/98) Exercise time 4 minutes 31 seconds on Bruce protocol, achieved 6.46 METS, 101% of age-predicted maximum heart rate (APMHR).  Stress ECG negative for ischemia. Normal myocardial perfusion without evidence of reversible ischemia. No obvious regional wall motion abnormalities and wall function/thickness preserved. Calculated LVEF 66%, visually hyperdynamic. No prior studies available for comparison.   RADIOLOGY: N/A  Risk Assessment/Calculations:   Click Here to Calculate/Change CHADS2VASc Score The patient's CHADS2-VASc score is 3, indicating a 3.2% annual risk of stroke.      Labs:       Latest Ref Rng & Units 08/11/2022    2:33 PM 09/10/2013    4:54 AM 08/30/2013    3:29 PM  CBC  WBC 4.0 - 10.5 K/uL  12.8  6.9   Hemoglobin 11.1 - 15.9 g/dL 84.4  89.0  86.2   Hematocrit 34.0 - 46.6 % 43.6  31.7  39.8   Platelets 150 - 400 K/uL  231  281  Latest Ref Rng & Units 02/07/2023    1:11 PM 08/11/2022    2:33 PM 09/10/2013    4:54 AM  BMP  Glucose 70 - 99 mg/dL 99  887  843   BUN 8 - 27 mg/dL 8  15  13    Creatinine 0.57 - 1.00 mg/dL 8.95  8.86  9.27   BUN/Creat Ratio 12  - 28 8  13     Sodium 134 - 144 mmol/L 143  138  140   Potassium 3.5 - 5.2 mmol/L 4.7  4.8  4.0   Chloride 96 - 106 mmol/L 106  97  102   CO2 20 - 29 mmol/L 25  26  24    Calcium  8.7 - 10.3 mg/dL 9.6  89.6  9.2       Latest Ref Rng & Units 02/07/2023    1:11 PM 08/11/2022    2:33 PM 09/10/2013    4:54 AM  CMP  Glucose 70 - 99 mg/dL 99  887  843   BUN 8 - 27 mg/dL 8  15  13    Creatinine 0.57 - 1.00 mg/dL 8.95  8.86  9.27   Sodium 134 - 144 mmol/L 143  138  140   Potassium 3.5 - 5.2 mmol/L 4.7  4.8  4.0   Chloride 96 - 106 mmol/L 106  97  102   CO2 20 - 29 mmol/L 25  26  24    Calcium  8.7 - 10.3 mg/dL 9.6  89.6  9.2     No results found for: CHOL, HDL, LDLCALC, LDLDIRECT, TRIG, CHOLHDL No results for input(s): LIPOA in the last 8760 hours. No components found for: NTPROBNP No results for input(s): PROBNP in the last 8760 hours. No results for input(s): TSH in the last 8760 hours.  Collected: 08/27/2021. Total cholesterol 182, triglycerides 87, HDL 59, LDL 107, non-HDL 123  External Labs: Collected: March 09, 2023 provided by PCP. TSH 15.9. Total cholesterol 189, triglycerides 99, HDL 63, calculated LDL 108, non-HDL 126. BUN 11, creatinine 1.0 eGFR 57. Sodium 143, potassium 4.6, chloride 106, bicarb 30. Hemoglobin 14.8 g/dL  Physical Exam:    Today's Vitals   03/21/23 1259  BP: (!) 168/80  Pulse: 71  SpO2: 97%  Weight: 153 lb (69.4 kg)  Height: 5' 6 (1.676 m)   Body mass index is 24.69 kg/m. Wt Readings from Last 3 Encounters:  03/21/23 153 lb (69.4 kg)  10/06/22 145 lb 6.4 oz (66 kg)  08/11/22 144 lb (65.3 kg)    Physical Exam  Constitutional: No distress.  hemodynamically stable  Neck: No JVD present.  Cardiovascular: Normal rate, regular rhythm, S1 normal and S2 normal. Exam reveals no gallop, no S3 and no S4.  No murmur heard. Pulmonary/Chest: Effort normal and breath sounds normal. No stridor. She has no wheezes. She has no rales.   Abdominal: Soft. Bowel sounds are normal. She exhibits no distension. There is no abdominal tenderness.  Musculoskeletal:        General: No edema.     Cervical back: Neck supple.  Neurological: She is alert and oriented to person, place, and time. She has intact cranial nerves (2-12).  Skin: Skin is warm.     Impression & Recommendation(s):  Impression:   ICD-10-CM   1. Paroxysmal atrial fibrillation (HCC)  I48.0     2. Long term (current) use of anticoagulants  Z79.01     3. Benign hypertension  I10 Basic metabolic panel    hydrochlorothiazide  (HYDRODIURIL ) 25  MG tablet    Basic metabolic panel    4. Mixed hyperlipidemia  E78.2        Recommendation(s):  Paroxysmal atrial fibrillation (HCC) Rate control: Metoprolol . Rhythm control: N/A. Thromboembolic prophylaxis: Xarelto  Overall frequency of palpitations have improved significantly after initiation of pharmacological therapy.  Long term (current) use of anticoagulants Indication: Paroxysmal atrial fibrillation. Does not endorse evidence of bleeding. Most recent labs from December 2024 notes a stable hemoglobin Risks, benefits, alternatives discussed with the patient. Her primary care physician Dr. Lenora has graciously agreed to follow her hemoglobin.  Recommend checking her hemoglobin every 6 months to make sure she is not having concerns for bleeding.  I will see her back on a 1 year follow-up visit  Benign hypertension Office blood pressures are not at goal. Home blood pressures could also be better controlled. In addition, she also experiences shortness of breath with effort related activities. Currently on lisinopril 20 mg p.o. every afternoon and hydrochlorothiazide  12.5 mg p.o. every afternoon. I suspect with better blood pressure control her shortness of breath should improved.  She has already undergone ischemic workup as outlined above in the recent past. Will increase hydrochlorothiazide  to 25 mg p.o. every  afternoon with repeat labs in 1 week to reevaluate kidney function.  Mixed hyperlipidemia Currently on atorvastatin .   She denies myalgia or other side effects. Most recent lipids dated December 2024, independently reviewed as noted above. Currently managed by primary care provider.  Orders Placed:  Orders Placed This Encounter  Procedures   Basic metabolic panel    Standing Status:   Future    Number of Occurrences:   1    Expected Date:   03/28/2023    Expiration Date:   03/20/2024   Final Medication List:    Meds ordered this encounter  Medications   hydrochlorothiazide  (HYDRODIURIL ) 25 MG tablet    Sig: Take 1 tablet (25 mg total) by mouth daily.    Dispense:  90 tablet    Refill:  3    Medications Discontinued During This Encounter  Medication Reason   conjugated estrogens (PREMARIN) vaginal cream    hydrochlorothiazide  (HYDRODIURIL ) 12.5 MG tablet Dose change     Current Outpatient Medications:    albuterol  (VENTOLIN  HFA) 108 (90 Base) MCG/ACT inhaler, Inhale into the lungs every 6 (six) hours as needed for wheezing or shortness of breath., Disp: , Rfl:    atorvastatin  (LIPITOR) 20 MG tablet, Take 20 mg by mouth daily., Disp: , Rfl:    Azelastine HCl 137 MCG/SPRAY SOLN, Place 1 spray into both nostrils 2 (two) times daily., Disp: , Rfl:    b complex vitamins tablet, Take 1 tablet by mouth daily., Disp: , Rfl:    buPROPion  (WELLBUTRIN  XL) 150 MG 24 hr tablet, Take 150 mg by mouth every morning., Disp: , Rfl:    Calcium -Magnesium -Vitamin D  (CALCIUM  500 PO), Take 500 mg by mouth daily., Disp: , Rfl:    Cholecalciferol  (VITAMIN D ) 2000 UNITS CAPS, Take 2,000 Units by mouth daily., Disp: , Rfl:    fluticasone (FLONASE) 50 MCG/ACT nasal spray, Place into both nostrils daily., Disp: , Rfl:    hydrochlorothiazide  (HYDRODIURIL ) 25 MG tablet, Take 1 tablet (25 mg total) by mouth daily., Disp: 90 tablet, Rfl: 3   levothyroxine  (SYNTHROID , LEVOTHROID) 112 MCG tablet, Take 112 mcg by  mouth daily before breakfast., Disp: , Rfl:    lisinopril (ZESTRIL) 20 MG tablet, Take 20 mg by mouth daily., Disp: , Rfl:  metoprolol  succinate (TOPROL  XL) 25 MG 24 hr tablet, Take 0.5 tablets (12.5 mg total) by mouth daily. Hold if systolic blood pressure (top number) less than 100 mmHg or pulse less than 55 bpm., Disp: , Rfl:    Multiple Vitamin (MULTIVITAMIN WITH MINERALS) TABS tablet, Take 1 tablet by mouth daily., Disp: , Rfl:    RESTASIS 0.05 % ophthalmic emulsion, Place 1 drop into both eyes 2 (two) times daily., Disp: , Rfl:    rivaroxaban  (XARELTO ) 20 MG TABS tablet, Take 1 tablet (20 mg total) by mouth daily with supper., Disp: 90 tablet, Rfl: 0   vitamin C  (ASCORBIC ACID ) 500 MG tablet, Take 500 mg by mouth daily., Disp: , Rfl:    YUVAFEM 10 MCG TABS vaginal tablet, Place 1 tablet vaginally 2 (two) times a week., Disp: , Rfl:   Consent:   N/A  Disposition:   1 year follow-up sooner if needed. Patient may be asked to follow-up sooner based on the results of the above-mentioned testing.  Her questions and concerns were addressed to her satisfaction. She voices understanding of the recommendations provided during this encounter.    Signed, Madonna Large, DO, [facility] [place]  [facility]  [address] [place], [place] [address] 03/21/2023 1:24 PM

## 2023-03-21 NOTE — Patient Instructions (Signed)
 Medication Instructions:  Your physician has recommended you make the following change in your medication:   INCREASE Hydrochlorothiazide  to 25 mg once daily   *If you need a refill on your cardiac medications before your next appointment, please call your pharmacy*  Lab Work: To Be Completed in 1 week: BMP If you have labs (blood work) drawn today and your tests are completely normal, you will receive your results only by: MyChart Message (if you have MyChart) OR A paper copy in the mail If you have any lab test that is abnormal or we need to change your treatment, we will call you to review the results.  Testing/Procedures: None ordered today.  Follow-Up: At [facility], you and your health needs are our priority.  As part of our continuing mission to provide you with exceptional heart care, we have created designated Provider Care Teams.  These Care Teams include your primary Cardiologist (physician) and Advanced Practice Providers (APPs -  Physician Assistants and Nurse Practitioners) who all work together to provide you with the care you need, when you need it.  We recommend signing up for the patient portal called MyChart.  Sign up information is provided on this After Visit Summary.  MyChart is used to connect with patients for Virtual Visits (Telemedicine).  Patients are able to view lab/test results, encounter notes, upcoming appointments, etc.  Non-urgent messages can be sent to your provider as well.   To learn more about what you can do with MyChart, go to [email or web address].    Your next appointment:   1 year(s)  The format for your next appointment:   In Person  Provider:   Madonna Large, DO {

## 2023-04-03 ENCOUNTER — Other Ambulatory Visit: Payer: Self-pay | Admitting: Cardiology

## 2023-04-03 DIAGNOSIS — I480 Paroxysmal atrial fibrillation: Secondary | ICD-10-CM

## 2023-04-04 DIAGNOSIS — I10 Essential (primary) hypertension: Secondary | ICD-10-CM | POA: Diagnosis not present

## 2023-04-05 LAB — BASIC METABOLIC PANEL
BUN/Creatinine Ratio: 15 (ref 12–28)
BUN: 13 mg/dL (ref 8–27)
CO2: 26 mmol/L (ref 20–29)
Calcium: 9.4 mg/dL (ref 8.7–10.3)
Chloride: 100 mmol/L (ref 96–106)
Creatinine, Ser: 0.87 mg/dL (ref 0.57–1.00)
Glucose: 87 mg/dL (ref 70–99)
Potassium: 5 mmol/L (ref 3.5–5.2)
Sodium: 137 mmol/L (ref 134–144)
eGFR: 72 mL/min/{1.73_m2} (ref >59)

## 2023-04-21 DIAGNOSIS — E039 Hypothyroidism, unspecified: Secondary | ICD-10-CM | POA: Diagnosis not present

## 2023-05-10 DIAGNOSIS — H6991 Unspecified Eustachian tube disorder, right ear: Secondary | ICD-10-CM | POA: Diagnosis not present

## 2023-05-10 DIAGNOSIS — Z6825 Body mass index (BMI) 25.0-25.9, adult: Secondary | ICD-10-CM | POA: Diagnosis not present

## 2023-05-10 DIAGNOSIS — H8111 Benign paroxysmal vertigo, right ear: Secondary | ICD-10-CM | POA: Diagnosis not present

## 2023-05-11 DIAGNOSIS — H10013 Acute follicular conjunctivitis, bilateral: Secondary | ICD-10-CM | POA: Diagnosis not present

## 2023-05-31 DIAGNOSIS — J0110 Acute frontal sinusitis, unspecified: Secondary | ICD-10-CM | POA: Diagnosis not present

## 2023-08-10 ENCOUNTER — Other Ambulatory Visit: Payer: Self-pay | Admitting: Cardiology

## 2023-08-10 DIAGNOSIS — I480 Paroxysmal atrial fibrillation: Secondary | ICD-10-CM

## 2023-08-11 NOTE — Telephone Encounter (Signed)
 Xarelto  20mg  refill request received. Pt is 73 years old, weight-69.4kg, Crea-0.87 on 04/04/23, last seen by Dr. Albert Huff on 03/21/23, Diagnosis-Afib, CrCl-64.98 mL/min; Dose is appropriate based on dosing criteria. Will send in refill to requested pharmacy.

## 2023-09-14 ENCOUNTER — Other Ambulatory Visit: Payer: Self-pay | Admitting: Family Medicine

## 2023-09-14 DIAGNOSIS — Z1231 Encounter for screening mammogram for malignant neoplasm of breast: Secondary | ICD-10-CM

## 2023-10-03 DIAGNOSIS — Z79899 Other long term (current) drug therapy: Secondary | ICD-10-CM | POA: Diagnosis not present

## 2023-10-03 DIAGNOSIS — E782 Mixed hyperlipidemia: Secondary | ICD-10-CM | POA: Diagnosis not present

## 2023-10-03 DIAGNOSIS — E039 Hypothyroidism, unspecified: Secondary | ICD-10-CM | POA: Diagnosis not present

## 2023-10-03 DIAGNOSIS — M85851 Other specified disorders of bone density and structure, right thigh: Secondary | ICD-10-CM | POA: Diagnosis not present

## 2023-10-03 DIAGNOSIS — I10 Essential (primary) hypertension: Secondary | ICD-10-CM | POA: Diagnosis not present

## 2023-10-04 DIAGNOSIS — N952 Postmenopausal atrophic vaginitis: Secondary | ICD-10-CM | POA: Diagnosis not present

## 2023-10-04 DIAGNOSIS — Z860100 Personal history of colon polyps, unspecified: Secondary | ICD-10-CM | POA: Diagnosis not present

## 2023-10-04 DIAGNOSIS — M85851 Other specified disorders of bone density and structure, right thigh: Secondary | ICD-10-CM | POA: Diagnosis not present

## 2023-10-04 DIAGNOSIS — Z0000 Encounter for general adult medical examination without abnormal findings: Secondary | ICD-10-CM | POA: Diagnosis not present

## 2023-10-04 DIAGNOSIS — Z6825 Body mass index (BMI) 25.0-25.9, adult: Secondary | ICD-10-CM | POA: Diagnosis not present

## 2023-10-04 DIAGNOSIS — E782 Mixed hyperlipidemia: Secondary | ICD-10-CM | POA: Diagnosis not present

## 2023-10-04 DIAGNOSIS — I10 Essential (primary) hypertension: Secondary | ICD-10-CM | POA: Diagnosis not present

## 2023-10-04 DIAGNOSIS — F3340 Major depressive disorder, recurrent, in remission, unspecified: Secondary | ICD-10-CM | POA: Diagnosis not present

## 2023-10-04 DIAGNOSIS — H6991 Unspecified Eustachian tube disorder, right ear: Secondary | ICD-10-CM | POA: Diagnosis not present

## 2023-10-05 DIAGNOSIS — M9902 Segmental and somatic dysfunction of thoracic region: Secondary | ICD-10-CM | POA: Diagnosis not present

## 2023-10-05 DIAGNOSIS — M9901 Segmental and somatic dysfunction of cervical region: Secondary | ICD-10-CM | POA: Diagnosis not present

## 2023-10-05 DIAGNOSIS — M62830 Muscle spasm of back: Secondary | ICD-10-CM | POA: Diagnosis not present

## 2023-10-05 DIAGNOSIS — M9903 Segmental and somatic dysfunction of lumbar region: Secondary | ICD-10-CM | POA: Diagnosis not present

## 2023-10-09 DIAGNOSIS — M9902 Segmental and somatic dysfunction of thoracic region: Secondary | ICD-10-CM | POA: Diagnosis not present

## 2023-10-09 DIAGNOSIS — M62830 Muscle spasm of back: Secondary | ICD-10-CM | POA: Diagnosis not present

## 2023-10-09 DIAGNOSIS — M9901 Segmental and somatic dysfunction of cervical region: Secondary | ICD-10-CM | POA: Diagnosis not present

## 2023-10-09 DIAGNOSIS — M9903 Segmental and somatic dysfunction of lumbar region: Secondary | ICD-10-CM | POA: Diagnosis not present

## 2023-10-11 ENCOUNTER — Other Ambulatory Visit (HOSPITAL_BASED_OUTPATIENT_CLINIC_OR_DEPARTMENT_OTHER): Payer: Self-pay | Admitting: Family Medicine

## 2023-10-11 DIAGNOSIS — M810 Age-related osteoporosis without current pathological fracture: Secondary | ICD-10-CM

## 2023-10-12 DIAGNOSIS — M9903 Segmental and somatic dysfunction of lumbar region: Secondary | ICD-10-CM | POA: Diagnosis not present

## 2023-10-12 DIAGNOSIS — M62830 Muscle spasm of back: Secondary | ICD-10-CM | POA: Diagnosis not present

## 2023-10-12 DIAGNOSIS — M9902 Segmental and somatic dysfunction of thoracic region: Secondary | ICD-10-CM | POA: Diagnosis not present

## 2023-10-12 DIAGNOSIS — M9901 Segmental and somatic dysfunction of cervical region: Secondary | ICD-10-CM | POA: Diagnosis not present

## 2023-10-16 DIAGNOSIS — M9902 Segmental and somatic dysfunction of thoracic region: Secondary | ICD-10-CM | POA: Diagnosis not present

## 2023-10-16 DIAGNOSIS — M62830 Muscle spasm of back: Secondary | ICD-10-CM | POA: Diagnosis not present

## 2023-10-16 DIAGNOSIS — M9903 Segmental and somatic dysfunction of lumbar region: Secondary | ICD-10-CM | POA: Diagnosis not present

## 2023-10-16 DIAGNOSIS — M9901 Segmental and somatic dysfunction of cervical region: Secondary | ICD-10-CM | POA: Diagnosis not present

## 2023-10-23 DIAGNOSIS — M62830 Muscle spasm of back: Secondary | ICD-10-CM | POA: Diagnosis not present

## 2023-10-23 DIAGNOSIS — M9902 Segmental and somatic dysfunction of thoracic region: Secondary | ICD-10-CM | POA: Diagnosis not present

## 2023-10-23 DIAGNOSIS — M9903 Segmental and somatic dysfunction of lumbar region: Secondary | ICD-10-CM | POA: Diagnosis not present

## 2023-10-23 DIAGNOSIS — M9901 Segmental and somatic dysfunction of cervical region: Secondary | ICD-10-CM | POA: Diagnosis not present

## 2023-10-26 DIAGNOSIS — M9903 Segmental and somatic dysfunction of lumbar region: Secondary | ICD-10-CM | POA: Diagnosis not present

## 2023-10-26 DIAGNOSIS — M9902 Segmental and somatic dysfunction of thoracic region: Secondary | ICD-10-CM | POA: Diagnosis not present

## 2023-10-26 DIAGNOSIS — M62830 Muscle spasm of back: Secondary | ICD-10-CM | POA: Diagnosis not present

## 2023-10-26 DIAGNOSIS — M9901 Segmental and somatic dysfunction of cervical region: Secondary | ICD-10-CM | POA: Diagnosis not present

## 2023-11-15 ENCOUNTER — Encounter (INDEPENDENT_AMBULATORY_CARE_PROVIDER_SITE_OTHER): Payer: Self-pay

## 2023-11-22 DIAGNOSIS — M9902 Segmental and somatic dysfunction of thoracic region: Secondary | ICD-10-CM | POA: Diagnosis not present

## 2023-11-22 DIAGNOSIS — M62830 Muscle spasm of back: Secondary | ICD-10-CM | POA: Diagnosis not present

## 2023-11-22 DIAGNOSIS — M9901 Segmental and somatic dysfunction of cervical region: Secondary | ICD-10-CM | POA: Diagnosis not present

## 2023-11-22 DIAGNOSIS — M9903 Segmental and somatic dysfunction of lumbar region: Secondary | ICD-10-CM | POA: Diagnosis not present

## 2023-11-27 DIAGNOSIS — Z7901 Long term (current) use of anticoagulants: Secondary | ICD-10-CM | POA: Diagnosis not present

## 2023-11-27 DIAGNOSIS — K219 Gastro-esophageal reflux disease without esophagitis: Secondary | ICD-10-CM | POA: Diagnosis not present

## 2023-11-27 DIAGNOSIS — Z86018 Personal history of other benign neoplasm: Secondary | ICD-10-CM | POA: Diagnosis not present

## 2023-11-27 DIAGNOSIS — I4891 Unspecified atrial fibrillation: Secondary | ICD-10-CM | POA: Diagnosis not present

## 2023-11-28 ENCOUNTER — Telehealth: Payer: Self-pay

## 2023-11-28 DIAGNOSIS — Z01818 Encounter for other preprocedural examination: Secondary | ICD-10-CM

## 2023-11-28 NOTE — Telephone Encounter (Signed)
   Pre-operative Risk Assessment    Patient Name: Theresa Reese  DOB: Aug 09, 1950 MRN: [id number]   Date of last office visit: 03/21/23 MADONNA LARGE, DO Date of next office visit: NONE   Request for Surgical Clearance    Procedure:  COLONOSCOPY  Date of Surgery:  Clearance 01/16/24                                Surgeon:  DR LAYLA LAH Surgeon's Group or Practice Name:  [facility] Phone number:  [phone] Fax number:  [phone]   Type of Clearance Requested:   - Medical  - Pharmacy:  Hold Rivaroxaban  (Xarelto ) 2 DAYS PRIOR   Type of Anesthesia:  PROPOFOL    Additional requests/questions:    Signed, Lucie DELENA Ku   11/28/2023, 12:41 PM

## 2023-11-30 ENCOUNTER — Telehealth: Payer: Self-pay

## 2023-11-30 NOTE — Telephone Encounter (Signed)
 Med Rec and Consent done     Patient Consent for Virtual Visit        Theresa Reese has provided verbal consent on 11/30/2023 for a virtual visit (video or telephone).   CONSENT FOR VIRTUAL VISIT FOR:  Theresa Reese  By participating in this virtual visit I agree to the following:  I hereby voluntarily request, consent and authorize [facility] and its employed or contracted physicians, physician assistants, nurse practitioners or other licensed health care professionals (the Practitioner), to provide me with telemedicine health care services (the "Services) as deemed necessary by the treating Practitioner. I acknowledge and consent to receive the Services by the Practitioner via telemedicine. I understand that the telemedicine visit will involve communicating with the Practitioner through live audiovisual communication technology and the disclosure of certain medical information by electronic transmission. I acknowledge that I have been given the opportunity to request an in-person assessment or other available alternative prior to the telemedicine visit and am voluntarily participating in the telemedicine visit.  I understand that I have the right to withhold or withdraw my consent to the use of telemedicine in the course of my care at any time, without affecting my right to future care or treatment, and that the Practitioner or I may terminate the telemedicine visit at any time. I understand that I have the right to inspect all information obtained and/or recorded in the course of the telemedicine visit and may receive copies of available information for a reasonable fee.  I understand that some of the potential risks of receiving the Services via telemedicine include:  Delay or interruption in medical evaluation due to technological equipment failure or disruption; Information transmitted may not be sufficient (e.g. poor resolution of images) to allow for appropriate medical decision  making by the Practitioner; and/or  In rare instances, security protocols could fail, causing a breach of personal health information.  Furthermore, I acknowledge that it is my responsibility to provide information about my medical history, conditions and care that is complete and accurate to the best of my ability. I acknowledge that Practitioner's advice, recommendations, and/or decision may be based on factors not within their control, such as incomplete or inaccurate data provided by me or distortions of diagnostic images or specimens that may result from electronic transmissions. I understand that the practice of medicine is not an exact science and that Practitioner makes no warranties or guarantees regarding treatment outcomes. I acknowledge that a copy of this consent can be made available to me via my patient portal [facility] MyChart), or I can request a printed copy by calling the office of [facility].    I understand that my insurance will be billed for this visit.   I have read or had this consent read to me. I understand the contents of this consent, which adequately explains the benefits and risks of the Services being provided via telemedicine.  I have been provided ample opportunity to ask questions regarding this consent and the Services and have had my questions answered to my satisfaction. I give my informed consent for the services to be provided through the use of telemedicine in my medical care

## 2023-11-30 NOTE — Telephone Encounter (Signed)
   Name: Theresa Reese  DOB: February 19, 1951  MRN: [id number]  Primary Cardiologist: Madonna Large, DO   Preoperative team, please contact this patient and set up a phone call appointment for further preoperative risk assessment. Please obtain consent and complete medication review. Thank you for your help.  I confirm that guidance regarding antiplatelet and oral anticoagulation therapy has been completed and, if necessary, noted below.  I also confirmed the patient resides in the state of [place] . As per [facility] Medical Board telemedicine laws, the patient must reside in the state in which the provider is licensed.   Jon Nat Hails, PA 11/30/2023, 9:12 AM [facility]

## 2023-11-30 NOTE — Telephone Encounter (Signed)
 Patient with diagnosis of atrial fibrillation on Xarelto  for anticoagulation.    Procedure:  COLONOSCOPY   Date of Surgery:  Clearance 01/16/24    CHA2DS2-VASc Score = 3   This indicates a 3.2% annual risk of stroke. The patient's score is based upon: CHF History: 0 HTN History: 1 Diabetes History: 0 Stroke History: 0 Vascular Disease History: 0 Age Score: 1 Gender Score: 1    CrCl 65 Platelet count - no CBC in > 1 yer  Patient has not had an Afib/aflutter ablation or Watchman within the last 3 months or DCCV within the last 30 days   Will need updated CBC for final clearance  **This guidance is not considered finalized until pre-operative APP has relayed final recommendations.**

## 2023-11-30 NOTE — Telephone Encounter (Signed)
 S/W pt and scheduled TELE preop appt 12/12/23.  Med Rec and Consent done   Will update surgeons office

## 2023-12-01 NOTE — Telephone Encounter (Signed)
 Patient needs CBC completed for pharmacy please. See below

## 2023-12-01 NOTE — Telephone Encounter (Signed)
 Per Theresa Reese pt needs CBC. (Lab ordered and released to [facility])  S/W patient about labs and patient stated she will complete labs by 12/05/23.

## 2023-12-01 NOTE — Addendum Note (Signed)
 Addended by: Emmilyn Crooke A on: 12/01/2023 05:36 PM   Modules accepted: Orders

## 2023-12-01 NOTE — Telephone Encounter (Signed)
 Please order CBC for final pharmacy clearance

## 2023-12-04 DIAGNOSIS — Z01818 Encounter for other preprocedural examination: Secondary | ICD-10-CM | POA: Diagnosis not present

## 2023-12-04 LAB — CBC
Hematocrit: 42 % (ref 34.0–46.6)
Hemoglobin: 13.9 g/dL (ref 11.1–15.9)
MCH: 31.5 pg (ref 26.6–33.0)
MCHC: 33.1 g/dL (ref 31.5–35.7)
MCV: 95 fL (ref 79–97)
Platelets: 285 x10E3/uL (ref 150–450)
RBC: 4.41 x10E6/uL (ref 3.77–5.28)
RDW: 12 % (ref 11.7–15.4)
WBC: 6.5 x10E3/uL (ref 3.4–10.8)

## 2023-12-11 ENCOUNTER — Ambulatory Visit (INDEPENDENT_AMBULATORY_CARE_PROVIDER_SITE_OTHER): Admitting: Audiology

## 2023-12-11 ENCOUNTER — Encounter (INDEPENDENT_AMBULATORY_CARE_PROVIDER_SITE_OTHER): Payer: Self-pay | Admitting: Otolaryngology

## 2023-12-11 ENCOUNTER — Ambulatory Visit (INDEPENDENT_AMBULATORY_CARE_PROVIDER_SITE_OTHER): Admitting: Otolaryngology

## 2023-12-11 VITALS — BP 138/82 | HR 97 | Ht 66.0 in | Wt 147.0 lb

## 2023-12-11 DIAGNOSIS — H903 Sensorineural hearing loss, bilateral: Secondary | ICD-10-CM

## 2023-12-11 DIAGNOSIS — R2689 Other abnormalities of gait and mobility: Secondary | ICD-10-CM

## 2023-12-11 DIAGNOSIS — H6991 Unspecified Eustachian tube disorder, right ear: Secondary | ICD-10-CM

## 2023-12-11 DIAGNOSIS — H8110 Benign paroxysmal vertigo, unspecified ear: Secondary | ICD-10-CM | POA: Diagnosis not present

## 2023-12-11 NOTE — Progress Notes (Signed)
  [address] [place], [place] [address] [phone]  Audiological Evaluation    Name: Theresa Reese     DOB:   1951-01-22      MRN:   [id number]                                                                                     Service Date: 12/11/2023     Accompanied by: unaccompanied   Patient comes today after Dr. Tobie, ENT sent a referral for a hearing evaluation due to concerns with ear fullness.   Symptoms Yes Details  Hearing loss  [x]  Right muffled  Tinnitus  []    Ear pain/ infections/pressure  [x]  Right ear pressure  Balance problems  []    Noise exposure history  []    Previous ear surgeries  []    Family history of hearing loss  []    Amplification  []    Other  []      Otoscopy: Right ear: Clear external ear canal and notable landmarks visualized on the tympanic membrane. Left ear:  Clear external ear canal and notable landmarks visualized on the tympanic membrane.  Tympanometry: Right ear: Type A- Normal external ear canal volume with normal middle ear pressure and tympanic membrane compliance. Left ear: Type A- Normal external ear canal volume with normal middle ear pressure and tympanic membrane compliance.   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  Speech Audiometry: Right ear- Speech Reception Threshold (SRT) was obtained at 15 dBHL. Left ear-Speech Reception Threshold (SRT) was obtained at 15 dBHL.   Word Recognition Score Tested using NU-6 (recorded) Right ear: 100% was obtained at a presentation level of 60 dBHL with contralateral masking which is deemed as  excellent. Left ear: 100% was obtained at a presentation level of 60 dBHL with contralateral masking which is deemed as  excellent.   The hearing test results were  completed under headphones and results are deemed to be of good reliability. Test technique:  conventional    Impression: There is not a significant difference in pure-tone thresholds between ears. There is not a significant difference in the word recognition score in between ears.    Recommendations: Follow up with ENT as scheduled for today. Return for a hearing evaluation in 2-3 years, before if concerns with hearing changes arise or per MD recommendation.   Andras Grunewald MARIE LEROUX-MARTINEZ, AUD

## 2023-12-11 NOTE — Progress Notes (Signed)
 Dear Dr. Aisha, Here is my assessment for our mutual patient, Theresa Reese. Thank you for allowing me the opportunity to care for your patient. Please do not hesitate to contact me should you have any other questions. Sincerely, Dr. Eldora Blanch  Otolaryngology Clinic Note Referring provider: Dr. Aisha HPI:  Theresa Reese is a 73 y.o. female kindly referred by Dr. Aisha for evaluation of ear fullness and dizziness  Initial visit (11/2023): Patient reports: she has been having some imbalance since this past spring - after she blew her nose and heard her ear pop/fullness. She was diagnosed with ETD, and her ear fullness improved. She is not having ear fullness anymore, but when she blows her nose she feels like the ear closes and then opens up when she swallows. She reports that when she lays down in the bed or gets up or holds her head back, she will have dizziness (room does not spin, but feels off balance) as well as vertigo (room spins). The symptoms last for a few seconds and then gets better. Sometimes does get nauseated. No sound/pressure induced vertigo. Hearing without issue. She has not tried anything for the vertigo. She has used nasal sprays as needed. She tried Epley but could not tolerate it. No urinary sx Patient denies: ear pain, fullness, drainage, tinnitus Patient also denies barotrauma, vestibular suppressant use, ototoxic medication use Prior ear surgery: no  ENT Surgery: no Personal or FHx of bleeding dz or anesthesia difficulty: no  AP/AC: Xarelto   Tobacco: former, quit  PMHx: A-fib, Hypothyroidism, HTN, MDD, Cervicalgia  Independent Review of Additional Tests or Records:  11/2023 Theresa Reese was independently reviewed and interpreted by me and it reveals - normal downsloping to mod SNHL/ A/A tymps  SNHL= Sensorineural hearing loss  Dr. Aisha (11/14/2023) Referral notes reviewed and uploaded or available in chart in media tab - R ETD, noted issues with tube in right  ear, noted two nose bleeds. On 3 BP medications; ears feel blocked when blowing nose. On flonase, astelin; Dx: ETD, Rx: ref to ENT  Labs reviewed and uploaded or available in chart in media tab - 11/03/2023 CBC and CMP - BUN/Cr 9/0.88; WBC 5.3   PMH/Meds/All/SocHx/FamHx/ROS:   Past Medical History:  Diagnosis Date   GERD (gastroesophageal reflux disease)    Hypercholesteremia    Hypertension    Hypothyroid    Insomnia    Left knee DJD    Left shoulder pain    Lymphocytic colitis      Past Surgical History:  Procedure Laterality Date   BACK SURGERY     CESAREAN SECTION  1987   CESAREAN SECTION  1990   COLONOSCOPY     EYE SURGERY Bilateral    cataract   JOINT REPLACEMENT Left    KNEE ARTHROSCOPY W/ MENISCECTOMY Left 2014   Dr Duwayne   low back surgery  1997   Dr Mora   low back surgery  1994   Dr Drury   RETINAL DETACHMENT SURGERY Right 2013   SHOULDER ARTHROSCOPY WITH ROTATOR CUFF REPAIR Left 05/22/2019   Procedure: LEFT SHOULDER ARTHROSCOPY WITH ROTATOR CUFF REPAIR;  Surgeon: Cristy Bonner DASEN, MD;  Location: [facility];  Service: Orthopedics;  Laterality: Left;   SHOULDER ARTHROSCOPY WITH SUBACROMIAL DECOMPRESSION AND BICEP TENDON REPAIR Left 05/22/2019   Procedure: LEFT SHOULDER ARTHROSCOPY  DEBRIDEMENT WITH SUBACROMIAL DECOMPRESSION AND BICEP TENODESIS;  Surgeon: Cristy Bonner DASEN, MD;  Location: [facility];  Service: Orthopedics;  Laterality: Left;   TOTAL  KNEE ARTHROPLASTY Left 09/09/2013   Procedure: LEFT TOTAL KNEE ARTHROPLASTY;  Surgeon: Lamar DELENA Millman, MD;  Location: [facility];  Service: Orthopedics;  Laterality: Left;    Family History  Problem Relation Age of Onset   Heart disease Mother    Heart attack Mother    Hypertension Mother    Heart attack Father    Hypertension Father    Stroke Father    Breast cancer Neg Hx      Social Connections: Unknown (04/15/2022)   Received from [facility]   Social Network    Social Network: Not  on file      Current Outpatient Medications:    albuterol  (VENTOLIN  HFA) 108 (90 Base) MCG/ACT inhaler, Inhale into the lungs every 6 (six) hours as needed for wheezing or shortness of breath., Disp: , Rfl:    atorvastatin  (LIPITOR) 20 MG tablet, Take 20 mg by mouth daily., Disp: , Rfl:    Azelastine HCl 137 MCG/SPRAY SOLN, Place 1 spray into both nostrils 2 (two) times daily., Disp: , Rfl:    b complex vitamins tablet, Take 1 tablet by mouth daily., Disp: , Rfl:    buPROPion  (WELLBUTRIN  XL) 150 MG 24 hr tablet, Take 150 mg by mouth every morning., Disp: , Rfl:    Calcium -Magnesium -Vitamin D  (CALCIUM  500 PO), Take 500 mg by mouth daily., Disp: , Rfl:    Cholecalciferol  (VITAMIN D ) 2000 UNITS CAPS, Take 2,000 Units by mouth daily., Disp: , Rfl:    fluticasone (FLONASE) 50 MCG/ACT nasal spray, Place into both nostrils daily., Disp: , Rfl:    hydrochlorothiazide  (HYDRODIURIL ) 25 MG tablet, Take 1 tablet (25 mg total) by mouth daily., Disp: 90 tablet, Rfl: 3   levothyroxine  (SYNTHROID , LEVOTHROID) 112 MCG tablet, Take 112 mcg by mouth daily before breakfast., Disp: , Rfl:    lisinopril (ZESTRIL) 20 MG tablet, Take 20 mg by mouth daily., Disp: , Rfl:    metoprolol  succinate (TOPROL -XL) 25 MG 24 hr tablet, TAKE 1 TABLET BY MOUTH ONCE DAILY. HOLD IF SYSTOLIC BLOOD PRESSURE (TOP NUMBER) IS LESS THAN 100 OR PULSE LESS THAN 55 BPM, Disp: 90 tablet, Rfl: 3   Multiple Vitamin (MULTIVITAMIN WITH MINERALS) TABS tablet, Take 1 tablet by mouth daily., Disp: , Rfl:    RESTASIS 0.05 % ophthalmic emulsion, Place 1 drop into both eyes 2 (two) times daily., Disp: , Rfl:    rivaroxaban  (XARELTO ) 20 MG TABS tablet, TAKE 1 TABLET BY MOUTH ONCE DAILY WITH SUPPER, Disp: 90 tablet, Rfl: 1   vitamin C  (ASCORBIC ACID ) 500 MG tablet, Take 500 mg by mouth daily., Disp: , Rfl:    YUVAFEM 10 MCG TABS vaginal tablet, Place 1 tablet vaginally 2 (two) times a week., Disp: , Rfl:    Physical Exam:   BP 138/82 (BP Location: Right  Arm, Patient Position: Sitting, Cuff Size: Large)   Pulse 97   Ht 5' 6 (1.676 m)   Wt 147 lb (66.7 kg)   SpO2 96%   BMI 23.73 kg/m   Salient findings:  CN II-XII intact Bilateral EAC clear and TM intact with well pneumatized middle ear spaces; no effusion on right Weber 512: mid Rinne 512: AC > BC b/l  When laying back, reports dizziness but no nystagmus noted; head shake negative; gait not broad based; negative DH Anterior rhinoscopy: Septum intact; bilateral inferior turbinates without significant hypertrophy No lesions of oral cavity/oropharynx; No obviously palpable neck masses/lymphadenopathy/thyromegaly No respiratory distress or stridor  Seprately Identifiable Procedures:  Prior to  initiating any procedures, risks/benefits/alternatives were explained to the patient and verbal consent obtained. None  Impression & Plans:  Theresa Reese is a 73 y.o. female with:  1. Benign paroxysmal positional vertigo, unspecified laterality   2. Imbalance   3. Sensorineural hearing loss, bilateral   4. Dysfunction of right eustachian tube    Multiple issues: - Right ETD - not an issue currently as she is able to easily pop the ear and reports fullness essentially resolved/back to baseline - SNHL: will observe; d/w pt re: HA, deferred - Imbalance/Vertigo: reports some dizziness when laying back in chair but no noted nystagmus. We discussed options - appears she may be having two different types of imbalance - one assoc with frank vertigo which sounds like BPPV, and another imbalance when she gets up/changes positions. DDX for this includes possible bradycardia/hypotension/cardiac cause as she is not having any other otologic sx.  She'll discuss this with cards; I nthe interim, we discussed options and she wishes to try vestibular therapy first prior to any formal vestibular testing at [facility]  F/u in 2-3 months, sooner as necessary ;refer to vest therapy  See below regarding exact medications  prescribed this encounter including dosages and route: No orders of the defined types were placed in this encounter.     Thank you for allowing me the opportunity to care for your patient. Please do not hesitate to contact me should you have any other questions.  Sincerely, Eldora Blanch, MD Otolaryngologist (ENT), [facility] Phone: [phone] Fax: [phone]  12/11/2023, 7:31 PM   MDM:  Level 4 - 99204d Complexity/Problems addressed: mod - chronic persistent problems Data complexity: mod - independent review of notes, labs, order/review test - Morbidity: low  - Prescription Drug prescribed or managed: no

## 2023-12-11 NOTE — Telephone Encounter (Signed)
 Pharmacy clearance con't:  Platelet count 285  Okay to hold Xarelto  for 2 days  Will not need bridging with enoxaparin.

## 2023-12-12 ENCOUNTER — Telehealth: Payer: Self-pay | Admitting: *Deleted

## 2023-12-12 ENCOUNTER — Ambulatory Visit: Attending: Cardiology | Admitting: Nurse Practitioner

## 2023-12-12 DIAGNOSIS — Z01810 Encounter for preprocedural cardiovascular examination: Secondary | ICD-10-CM

## 2023-12-12 NOTE — Telephone Encounter (Signed)
 Pt has appt 12/15/23 with Dr. Michele.

## 2023-12-12 NOTE — Progress Notes (Addendum)
 Virtual Visit via Telephone Note   Because of Theresa Reese co-morbid illnesses, she is at least at moderate risk for complications without adequate follow up.  This format is felt to be most appropriate for this patient at this time.  Due to technical limitations with video connection (technology), today's appointment will be conducted as an audio only telehealth visit, and Theresa Reese verbally agreed to proceed in this manner.   All issues noted in this document were discussed and addressed.  No physical exam could be performed with this format.  Evaluation Performed:  Preoperative cardiovascular risk assessment _____________   Date:  12/12/2023   Patient ID:  Theresa Reese, DOB April 19, 1950, MRN [id number] Patient Location:  Home Provider location:   Office  Primary Care Provider:  Aisha Harvey, MD Primary Cardiologist:  Theresa Large, DO  Chief Complaint / Patient Profile   73 y.o. y/o female with a h/o paroxysmal atrial fibrillation, hypertension, hyperlipidemia, hypothyroidism, anxiety, depression, and former tobacco use who is pending colonoscopy on 01/16/2024 with Dr. Elicia of [facility] and presents today for telephonic preoperative cardiovascular risk assessment.  History of Present Illness    Theresa Reese is a 73 y.o. female who presents via audio/video conferencing for a telehealth visit today.  Pt was last seen in cardiology clinic on 03/21/2023 by Dr. Large. At that time Theresa Reese was doing well. The patient is now pending procedure as outlined above. Since her last visit, she has experienced increased dizziness. She denies any presyncope or syncope.  Denies any significant palpitations.  However, given new symptoms, patient would prefer an in office visit for preoperative cardiac risk assessment.  Past Medical History    Past Medical History:  Diagnosis Date   GERD (gastroesophageal reflux disease)    Hypercholesteremia    Hypertension    Hypothyroid    Insomnia     Left knee DJD    Left shoulder pain    Lymphocytic colitis    Past Surgical History:  Procedure Laterality Date   BACK SURGERY     CESAREAN SECTION  1987   CESAREAN SECTION  1990   COLONOSCOPY     EYE SURGERY Bilateral    cataract   JOINT REPLACEMENT Left    KNEE ARTHROSCOPY W/ MENISCECTOMY Left 2014   Dr Theresa Reese   low back surgery  1997   Dr Theresa Reese   low back surgery  1994   Dr Theresa Reese   RETINAL DETACHMENT SURGERY Right 2013   SHOULDER ARTHROSCOPY WITH ROTATOR CUFF REPAIR Left 05/22/2019   Procedure: LEFT SHOULDER ARTHROSCOPY WITH ROTATOR CUFF REPAIR;  Surgeon: Theresa Bonner DASEN, MD;  Location: [facility];  Service: Orthopedics;  Laterality: Left;   SHOULDER ARTHROSCOPY WITH SUBACROMIAL DECOMPRESSION AND BICEP TENDON REPAIR Left 05/22/2019   Procedure: LEFT SHOULDER ARTHROSCOPY  DEBRIDEMENT WITH SUBACROMIAL DECOMPRESSION AND BICEP TENODESIS;  Surgeon: Theresa Bonner DASEN, MD;  Location: [facility];  Service: Orthopedics;  Laterality: Left;   TOTAL KNEE ARTHROPLASTY Left 09/09/2013   Procedure: LEFT TOTAL KNEE ARTHROPLASTY;  Surgeon: Theresa DELENA Millman, MD;  Location: [facility];  Service: Orthopedics;  Laterality: Left;    Allergies  Allergies  Allergen Reactions   Cephalexin Other (See Comments)    Yeast infection   Clarithromycin Other (See Comments)   Erythromycin Nausea And Vomiting and Other (See Comments)    Diarrhea   Hospitalized for dehydration   Levofloxacin     Other Reaction(s): unsure   Other  Other Reaction(s): Not available   Penicillins Rash    Other Reaction(s): Not available    Home Medications    Prior to Admission medications   Medication Sig Start Date End Date Taking? Authorizing Provider  albuterol  (VENTOLIN  HFA) 108 (90 Base) MCG/ACT inhaler Inhale into the lungs every 6 (six) hours as needed for wheezing or shortness of breath.    [provider]  atorvastatin  (LIPITOR) 20 MG tablet Take 20 mg by mouth daily.     [provider]  Azelastine HCl 137 MCG/SPRAY SOLN Place 1 spray into both nostrils 2 (two) times daily. 07/22/22   [provider]  b complex vitamins tablet Take 1 tablet by mouth daily.    [provider]  buPROPion  (WELLBUTRIN  XL) 150 MG 24 hr tablet Take 150 mg by mouth every morning.    [provider]  Calcium -Magnesium -Vitamin D  (CALCIUM  500 PO) Take 500 mg by mouth daily.    [provider]  Cholecalciferol  (VITAMIN D ) 2000 UNITS CAPS Take 2,000 Units by mouth daily.    [provider]  fluticasone (FLONASE) 50 MCG/ACT nasal spray Place into both nostrils daily.    [provider]  hydrochlorothiazide  (HYDRODIURIL ) 25 MG tablet Take 1 tablet (25 mg total) by mouth daily. 03/21/23   Tolia, Sunit, DO  levothyroxine  (SYNTHROID , LEVOTHROID) 112 MCG tablet Take 112 mcg by mouth daily before breakfast.    [provider]  lisinopril (ZESTRIL) 20 MG tablet Take 20 mg by mouth daily.    [provider]  metoprolol  succinate (TOPROL -XL) 25 MG 24 hr tablet TAKE 1 TABLET BY MOUTH ONCE DAILY. HOLD IF SYSTOLIC BLOOD PRESSURE (TOP NUMBER) IS LESS THAN 100 OR PULSE LESS THAN 55 BPM 04/05/23   Tolia, Sunit, DO  Multiple Vitamin (MULTIVITAMIN WITH MINERALS) TABS tablet Take 1 tablet by mouth daily.    [provider]  RESTASIS 0.05 % ophthalmic emulsion Place 1 drop into both eyes 2 (two) times daily.    [provider]  rivaroxaban  (XARELTO ) 20 MG TABS tablet TAKE 1 TABLET BY MOUTH ONCE DAILY WITH SUPPER 08/11/23   Tolia, Sunit, DO  vitamin C  (ASCORBIC ACID ) 500 MG tablet Take 500 mg by mouth daily.    [provider]  YUVAFEM 10 MCG TABS vaginal tablet Place 1 tablet vaginally 2 (two) times a week. 03/17/23   [provider]    Physical Exam    Vital Signs:  Theresa Reese does not have vital signs available for review today.  Given telephonic nature of communication, physical exam is  limited. AAOx3. NAD. Normal affect.  Speech and respirations are unlabored.  Accessory Clinical Findings    None  Assessment & Plan    1.  Preoperative Cardiovascular Risk Assessment:  Patient reports increased intermittent dizziness.  She denies any presyncope or syncope.  Denies any significant palpitations.  She was evaluated by ENT and was advised to follow-up with her cardiologist.  In this setting, patient would prefer an in-office visit for preoperative cardiac risk assessment.  I will have our preop coverage team reach out to the patient to schedule an office visit for preoperative cardiac evaluation.  In the meantime, I advised patient to monitor BP and heart rate.  Reviewed ED precautions.  Per office protocol, pt may hold Xarelto  for 2 days prior to procedure. Will not need bridging with enoxaparin. Please resume Xarelto  as soon as possible postprocedure, at the discretion of the surgeon.    A copy of  this note will be routed to requesting surgeon.  Time:   Today, I have spent 5 minutes with the patient with telehealth technology discussing medical history, symptoms, and management plan.     Damien JAYSON Braver, NP  12/12/2023, 10:30 AM

## 2023-12-12 NOTE — Telephone Encounter (Signed)
 Left message for the pt to call back and schedule an appt in office. Per preop APP Damien Boos, NP s/w the pt and she has been having some intermittent dizziness. Per NP the pt will need in office appt for the dizziness and for preop clearance.

## 2023-12-12 NOTE — Telephone Encounter (Signed)
 Me    12/12/23  1:34 PM Note Pt has appt 12/15/23 with Dr. Michele.       12/12/23 12:12 PM You routed this conversation to Cv Div Preop Callback (Selected Message)  Me    12/12/23 12:12 PM Note Left message for the pt to call back and schedule an appt in office. Per preop APP Damien Boos, NP s/w the pt and she has been having some intermittent dizziness. Per NP the pt will need in office appt for the dizziness and for preop clearance.      Me    12/12/23 12:08 PM Note ----- Message from Damien JAYSON Braver sent at 12/12/2023 10:33 AM EDT ----- Patient with new symptoms of intermittent dizziness.  Will need office visit for preop clearance.  Thank you-EM

## 2023-12-12 NOTE — Telephone Encounter (Signed)
-----   Message from Damien JAYSON Braver sent at 12/12/2023 10:33 AM EDT ----- Patient with new symptoms of intermittent dizziness.  Will need office visit for preop clearance.  Thank you-EM

## 2023-12-13 ENCOUNTER — Ambulatory Visit: Payer: Self-pay | Admitting: Adult Health

## 2023-12-15 ENCOUNTER — Ambulatory Visit: Attending: Cardiology | Admitting: Cardiology

## 2023-12-15 ENCOUNTER — Encounter: Payer: Self-pay | Admitting: Cardiology

## 2023-12-15 VITALS — BP 125/77 | HR 67 | Ht 66.0 in | Wt 154.0 lb

## 2023-12-15 DIAGNOSIS — I10 Essential (primary) hypertension: Secondary | ICD-10-CM | POA: Diagnosis not present

## 2023-12-15 DIAGNOSIS — I480 Paroxysmal atrial fibrillation: Secondary | ICD-10-CM

## 2023-12-15 DIAGNOSIS — E782 Mixed hyperlipidemia: Secondary | ICD-10-CM | POA: Diagnosis not present

## 2023-12-15 DIAGNOSIS — R42 Dizziness and giddiness: Secondary | ICD-10-CM

## 2023-12-15 DIAGNOSIS — Z01810 Encounter for preprocedural cardiovascular examination: Secondary | ICD-10-CM

## 2023-12-15 DIAGNOSIS — Z7901 Long term (current) use of anticoagulants: Secondary | ICD-10-CM

## 2023-12-15 NOTE — Patient Instructions (Signed)
 Medication Instructions:  Your physician recommends that you continue on your current medications as directed. Please refer to the Current Medication list given to you today.  *If you need a refill on your cardiac medications before your next appointment, please call your pharmacy*  Lab Work: None.  If you have labs (blood work) drawn today and your tests are completely normal, you will receive your results only by: MyChart Message (if you have MyChart) OR A paper copy in the mail If you have any lab test that is abnormal or we need to change your treatment, we will call you to review the results.  Testing/Procedures: Your physician has requested that you have a carotid duplex in one week. This test is an ultrasound of the carotid arteries in your neck. It looks at blood flow through these arteries that supply the brain with blood. Allow one hour for this exam. There are no restrictions or special instructions.   Follow-Up: At [facility], you and your health needs are our priority.  As part of our continuing mission to provide you with exceptional heart care, our providers are all part of one team.  This team includes your primary Cardiologist (physician) and Advanced Practice Providers or APPs (Physician Assistants and Nurse Practitioners) who all work together to provide you with the care you need, when you need it.  Your next appointment:   1 year(s)  Provider:   Madonna Large, DO    We recommend signing up for the patient portal called MyChart.  Sign up information is provided on this After Visit Summary.  MyChart is used to connect with patients for Virtual Visits (Telemedicine).  Patients are able to view lab/test results, encounter notes, upcoming appointments, etc.  Non-urgent messages can be sent to your provider as well.   To learn more about what you can do with MyChart, go to [email or web address].   Other Instructions Please make an appointment with your PCP to  discuss an MRI of the brain and a referral to neurology for dizziness.

## 2023-12-15 NOTE — Progress Notes (Signed)
 Cardiology Office Note:  .   Date:  12/15/2023  ID:  TEYLA SKIDGEL, DOB May 18, 1950, MRN [id number] PCP:  Aisha Harvey, MD  Former Cardiology Providers: N/A [facility] Providers Cardiologist:  Madonna Large, DO , [facility] (established care May 2024) Electrophysiologist:  None  Click to update primary MD,subspecialty MD or APP then REFRESH:1}    Chief Complaint  Patient presents with   Follow-up    Dizziness and clearance for colonoscopy    History of Present Illness: Theresa Reese is a 73 y.o. Caucasian female whose past medical history and cardiovascular risk factors includes: Hypothyroidism, hyperlipidemia, hypertension, history of major depression with anxiety, former smoker.  Patient was referred to practice for evaluation of tachycardia/atrial fibrillation.  Prior to establishing care patient was using [facility] mobile to evaluate her underlying rhythm.  She was noted to have episodes of atrial fibrillation which were corroborated along with findings of the apple iWatch as well.  Patient has been on AV nodal blocking agents as well as anticoagulation for thromboembolic prophylaxis.  She is tolerated medical therapy well.  She is also undergone ischemic workup in the past due to concerns for chest pain.  She presents today for 42-month follow-up visit and preprocedure risk stratification.  Patient is planning to undergo colonoscopy on 01/16/2024 with Dr. Elicia.  Denies anginal chest pain or heart failure symptoms. Overall functional capacity remains relatively stable.  She has paroxysmal episodes of atrial fibrillation and she appreciates when she goes in and out of rhythm.  She probably has had 1-2 episodes since last office visit and she confirms her symptoms with [facility] mobile.  The symptoms last for about 20 minutes and are self-limited.  She has been on anticoagulation without interruption.  Patient does not endorse evidence of bleeding  Patient is being scheduled for  routine colonoscopy as she is overdue.   She was referred to cardiology not only for preoperative risk assessment but to also discuss her dizziness. Started January 2025. Intermittent. Feels to be more frequent now compared to several months ago. Worse with tilting head forward, backward extension, and side-to-side. Feels better as long as her head remains still No significant change with truncal movements, sleeping to sitting sitting to standing positions No other focal neurological deficits. Has followed up with ENT and states that workup is unremarkable. Has not been followed by neurology and has not had any brain imaging to rule out stroke or other pathology  Review of Systems: .   Review of Systems  Cardiovascular:  Negative for chest pain, claudication, irregular heartbeat, leg swelling, near-syncope, orthopnea, palpitations, paroxysmal nocturnal dyspnea and syncope.  Respiratory:  Negative for shortness of breath.   Hematologic/Lymphatic: Negative for bleeding problem.  Neurological:  Positive for dizziness.    Studies Reviewed:   EKG: EKG Interpretation Date/Time:  Friday December 15 2023 08:51:10 EDT Ventricular Rate:  69 PR Interval:  126 QRS Duration:  78 QT Interval:  418 QTC Calculation: 447 R Axis:   14  Text Interpretation: Normal sinus rhythm Low voltage QRS When compared with ECG of 20-May-2019 09:54, No significant change was found Confirmed by Large Madonna [phone]) on 12/15/2023 8:54:57 AM  Echocardiogram: 09/20/2022: Single Lead EKG Strip: Normal Sinus  Normal LV systolic function with visual EF 60-65%. Left ventricle cavity is normal in size. Normal left ventricular wall thickness. Normal global wall motion. Normal diastolic filling pattern, normal LAP.  Mild tricuspid regurgitation. No evidence of pulmonary hypertension. No prior study for comparison.  Stress Testing: Exercise Myoview  stress test 08/25/2022: Stress ECG negative for ischemia. Normal  myocardial perfusion without evidence of reversible ischemia. See report for additional details  RADIOLOGY: N/A  Risk Assessment/Calculations:   Click Here to Calculate/Change CHADS2VASc Score The patient's CHADS2-VASc score is 3, indicating a 3.2% annual risk of stroke.      Labs:       Latest Ref Rng & Units 12/04/2023   11:45 AM 08/11/2022    2:33 PM 09/10/2013    4:54 AM  CBC  WBC 3.4 - 10.8 x10E3/uL 6.5   12.8   Hemoglobin 11.1 - 15.9 g/dL 86.0  84.4  89.0   Hematocrit 34.0 - 46.6 % 42.0  43.6  31.7   Platelets 150 - 450 x10E3/uL 285   231        Latest Ref Rng & Units 04/04/2023    1:49 PM 02/07/2023    1:11 PM 08/11/2022    2:33 PM  BMP  Glucose 70 - 99 mg/dL 87  99  887   BUN 8 - 27 mg/dL 13  8  15    Creatinine 0.57 - 1.00 mg/dL 9.12  8.95  8.86   BUN/Creat Ratio 12 - 28 15  8  13    Sodium 134 - 144 mmol/L 137  143  138   Potassium 3.5 - 5.2 mmol/L 5.0  4.7  4.8   Chloride 96 - 106 mmol/L 100  106  97   CO2 20 - 29 mmol/L 26  25  26    Calcium  8.7 - 10.3 mg/dL 9.4  9.6  89.6       Latest Ref Rng & Units 04/04/2023    1:49 PM 02/07/2023    1:11 PM 08/11/2022    2:33 PM  CMP  Glucose 70 - 99 mg/dL 87  99  887   BUN 8 - 27 mg/dL 13  8  15    Creatinine 0.57 - 1.00 mg/dL 9.12  8.95  8.86   Sodium 134 - 144 mmol/L 137  143  138   Potassium 3.5 - 5.2 mmol/L 5.0  4.7  4.8   Chloride 96 - 106 mmol/L 100  106  97   CO2 20 - 29 mmol/L 26  25  26    Calcium  8.7 - 10.3 mg/dL 9.4  9.6  89.6     No results found for: CHOL, HDL, LDLCALC, LDLDIRECT, TRIG, CHOLHDL No results for input(s): LIPOA in the last 8760 hours. No components found for: NTPROBNP No results for input(s): PROBNP in the last 8760 hours. No results for input(s): TSH in the last 8760 hours.  Collected: 08/27/2021. Total cholesterol 182, triglycerides 87, HDL 59, LDL 107, non-HDL 123  External Labs: Collected: March 09, 2023 provided by PCP. TSH 15.9. Total cholesterol 189,  triglycerides 99, HDL 63, calculated LDL 108, non-HDL 126. BUN 11, creatinine 1.0 eGFR 57. Sodium 143, potassium 4.6, chloride 106, bicarb 30. Hemoglobin 14.8 g/dL  External Labs: Collected: 10/03/2023 [facility] database. Total cholesterol 151, triglycerides 123, HDL 51, LDL 79. Hemoglobin 13.8 g/dL Potassium 5.1. TSH 9.73  Physical Exam:    Today's Vitals   12/15/23 0838 12/15/23 0845  BP: (!) 154/80 125/77  Pulse: 67 67  SpO2: 97%   Weight: 154 lb (69.9 kg)   Height: 5' 6 (1.676 m)      Body mass index is 24.86 kg/m. Wt Readings from Last 3 Encounters:  12/15/23 154 lb (69.9 kg)  12/11/23 147 lb (66.7 kg)  03/21/23 153 lb (  69.4 kg)    Physical Exam  Constitutional: No distress.  hemodynamically stable  Neck: No JVD present.  Cardiovascular: Normal rate, regular rhythm, S1 normal and S2 normal. Exam reveals no gallop, no S3 and no S4.  No murmur heard. Pulmonary/Chest: Effort normal and breath sounds normal. No stridor. She has no wheezes. She has no rales.  Abdominal: Soft. Bowel sounds are normal. She exhibits no distension. There is no abdominal tenderness.  Musculoskeletal:        General: No edema.     Cervical back: Neck supple.  Neurological: She is alert and oriented to person, place, and time. She has intact cranial nerves (2-12).  Skin: Skin is warm.     Impression & Recommendation(s):  Impression:   ICD-10-CM   1. Paroxysmal atrial fibrillation (HCC)  I48.0 EKG 12-Lead    VAS US  CAROTID    2. Long term (current) use of anticoagulants  Z79.01     3. Dizziness  R42 VAS US  CAROTID    4. Preprocedural cardiovascular examination  Z01.810     5. Benign hypertension  I10 VAS US  CAROTID    6. Mixed hyperlipidemia  E78.2         Recommendation(s):  Paroxysmal atrial fibrillation (HCC) Rate control: Metoprolol . Rhythm control: N/A. Thromboembolic prophylaxis: Xarelto  EKG today illustrates sinus rhythm. Intermittently has episodes of PAF  symptomatically and confirmed by [facility] mobile.   Likely 1-2 episodes in the last 9 months. Monitor for now  Long term (current) use of anticoagulants Indication: Paroxysmal atrial fibrillation. Does not endorse evidence of bleeding. Risks, benefits, alternatives discussed with the patient. Her primary care physician Dr. Lenora has graciously agreed to follow her hemoglobin.  Recommend checking her hemoglobin every 6 months to make sure she is not having concerns for bleeding.  Labs from July 2025 reviewed today [facility] database, hemoglobin 13.8 g/dL  Preprocedure risk stratification Procedure: COLONOSCOPY Date of Surgery: Clearance 01/16/24  Surgeon: DR LAYLA LAH Surgeon's Group or Practice Name: [facility]  According to the Revised Cardiac Risk Index (RCRI), her Perioperative Risk of Major Cardiac Event is (%): 0.4 Her Functional Capacity in METs is: 7.25 according to the Duke Activity Status Index (DASI). From a cardiovascular standpoint patient is acceptable risk for upcoming colonoscopy as long as carotid duplex does not show severe disease.  However, she would benefit from a medical clearance by her primary care provider given her dizziness.  Recommend that she follows up PCP to consider additional imaging and possible consult w/ neurology.  See below.  Dizziness/giddiness Ongoing since January 2025. Orthostatic vital signs negative No other focal neurological deficits on physical examination at this time Has been evaluated by ENT, per patient. Scheduled to have vestibular therapy, per patient Will check a carotid duplex to rule out organic disease She would benefit from MRI of the brain to rule out possible old CVA +/- consult with neurology Patient is advised to seek medical attention by going to the closest ER via EMS if she has focal neurological deficits or syncope. Will defer the need for MRI of the brain +/- neurology consult to primary team.  She will reach  out to her PCP for further recommendations  Benign hypertension Office blood pressures are at goal, upon recheck.  Continue hydrochlorothiazide  25 mg p.o. daily. Continue lisinopril 20 mg p.o. daily. Continue Toprol -XL 25 mg p.o. daily  Mixed hyperlipidemia Currently on atorvastatin .   She denies myalgia or other side effects. Currently managed by primary care provider.  Orders  Placed:  Orders Placed This Encounter  Procedures   EKG 12-Lead   Final Medication List:    No orders of the defined types were placed in this encounter.   There are no discontinued medications.    Current Outpatient Medications:    albuterol  (VENTOLIN  HFA) 108 (90 Base) MCG/ACT inhaler, Inhale into the lungs every 6 (six) hours as needed for wheezing or shortness of breath., Disp: , Rfl:    atorvastatin  (LIPITOR) 20 MG tablet, Take 20 mg by mouth daily., Disp: , Rfl:    Azelastine HCl 137 MCG/SPRAY SOLN, Place 1 spray into both nostrils 2 (two) times daily., Disp: , Rfl:    b complex vitamins tablet, Take 1 tablet by mouth daily., Disp: , Rfl:    buPROPion  (WELLBUTRIN  XL) 150 MG 24 hr tablet, Take 150 mg by mouth every morning., Disp: , Rfl:    Calcium -Magnesium -Vitamin D  (CALCIUM  500 PO), Take 500 mg by mouth daily., Disp: , Rfl:    Cholecalciferol  (VITAMIN D ) 2000 UNITS CAPS, Take 2,000 Units by mouth daily., Disp: , Rfl:    fluticasone (FLONASE) 50 MCG/ACT nasal spray, Place into both nostrils daily., Disp: , Rfl:    hydrochlorothiazide  (HYDRODIURIL ) 25 MG tablet, Take 1 tablet (25 mg total) by mouth daily., Disp: 90 tablet, Rfl: 3   levothyroxine  (SYNTHROID , LEVOTHROID) 112 MCG tablet, Take 112 mcg by mouth daily before breakfast., Disp: , Rfl:    lisinopril (ZESTRIL) 20 MG tablet, Take 20 mg by mouth daily., Disp: , Rfl:    metoprolol  succinate (TOPROL -XL) 25 MG 24 hr tablet, TAKE 1 TABLET BY MOUTH ONCE DAILY. HOLD IF SYSTOLIC BLOOD PRESSURE (TOP NUMBER) IS LESS THAN 100 OR PULSE LESS THAN 55 BPM,  Disp: 90 tablet, Rfl: 3   Multiple Vitamin (MULTIVITAMIN WITH MINERALS) TABS tablet, Take 1 tablet by mouth daily., Disp: , Rfl:    RESTASIS 0.05 % ophthalmic emulsion, Place 1 drop into both eyes 2 (two) times daily., Disp: , Rfl:    rivaroxaban  (XARELTO ) 20 MG TABS tablet, TAKE 1 TABLET BY MOUTH ONCE DAILY WITH SUPPER, Disp: 90 tablet, Rfl: 1   vitamin C  (ASCORBIC ACID ) 500 MG tablet, Take 500 mg by mouth daily., Disp: , Rfl:    YUVAFEM 10 MCG TABS vaginal tablet, Place 1 tablet vaginally 2 (two) times a week., Disp: , Rfl:   Consent:   N/A  Disposition:    1 year follow-up sooner if needed  Her questions and concerns were addressed to her satisfaction. She voices understanding of the recommendations provided during this encounter.    Signed, Madonna Michele HAS, [facility]  A Division of  [facility] [address] [place], [place] [address]  12/15/2023 11:13 AM

## 2023-12-18 ENCOUNTER — Institutional Professional Consult (permissible substitution) (INDEPENDENT_AMBULATORY_CARE_PROVIDER_SITE_OTHER)

## 2023-12-18 ENCOUNTER — Other Ambulatory Visit: Payer: Self-pay | Admitting: Cardiology

## 2023-12-18 DIAGNOSIS — Z7901 Long term (current) use of anticoagulants: Secondary | ICD-10-CM

## 2023-12-18 DIAGNOSIS — R42 Dizziness and giddiness: Secondary | ICD-10-CM

## 2023-12-18 DIAGNOSIS — I10 Essential (primary) hypertension: Secondary | ICD-10-CM

## 2023-12-18 DIAGNOSIS — Z01810 Encounter for preprocedural cardiovascular examination: Secondary | ICD-10-CM

## 2023-12-18 DIAGNOSIS — I480 Paroxysmal atrial fibrillation: Secondary | ICD-10-CM

## 2023-12-18 DIAGNOSIS — E782 Mixed hyperlipidemia: Secondary | ICD-10-CM

## 2023-12-22 ENCOUNTER — Ambulatory Visit (HOSPITAL_COMMUNITY)
Admission: RE | Admit: 2023-12-22 | Discharge: 2023-12-22 | Disposition: A | Discharge status: Home / Self Care | Source: Ambulatory Visit | Attending: Cardiology | Admitting: Cardiology

## 2023-12-22 DIAGNOSIS — R42 Dizziness and giddiness: Secondary | ICD-10-CM | POA: Diagnosis not present

## 2023-12-25 DIAGNOSIS — E782 Mixed hyperlipidemia: Secondary | ICD-10-CM | POA: Diagnosis not present

## 2023-12-25 DIAGNOSIS — E039 Hypothyroidism, unspecified: Secondary | ICD-10-CM | POA: Diagnosis not present

## 2023-12-25 DIAGNOSIS — M85851 Other specified disorders of bone density and structure, right thigh: Secondary | ICD-10-CM | POA: Diagnosis not present

## 2023-12-25 DIAGNOSIS — Z23 Encounter for immunization: Secondary | ICD-10-CM | POA: Diagnosis not present

## 2023-12-25 DIAGNOSIS — R42 Dizziness and giddiness: Secondary | ICD-10-CM | POA: Diagnosis not present

## 2023-12-25 DIAGNOSIS — Z6825 Body mass index (BMI) 25.0-25.9, adult: Secondary | ICD-10-CM | POA: Diagnosis not present

## 2023-12-25 DIAGNOSIS — I6523 Occlusion and stenosis of bilateral carotid arteries: Secondary | ICD-10-CM | POA: Diagnosis not present

## 2023-12-27 ENCOUNTER — Other Ambulatory Visit: Payer: Self-pay

## 2023-12-27 ENCOUNTER — Ambulatory Visit (HOSPITAL_COMMUNITY): Attending: Otolaryngology

## 2023-12-27 DIAGNOSIS — H903 Sensorineural hearing loss, bilateral: Secondary | ICD-10-CM | POA: Diagnosis not present

## 2023-12-27 DIAGNOSIS — H6991 Unspecified Eustachian tube disorder, right ear: Secondary | ICD-10-CM | POA: Diagnosis not present

## 2023-12-27 DIAGNOSIS — H8110 Benign paroxysmal vertigo, unspecified ear: Secondary | ICD-10-CM | POA: Insufficient documentation

## 2023-12-27 DIAGNOSIS — R42 Dizziness and giddiness: Secondary | ICD-10-CM | POA: Insufficient documentation

## 2023-12-27 NOTE — Therapy (Signed)
 OUTPATIENT PHYSICAL THERAPY VESTIBULAR EVALUATION     Patient Name: Theresa Reese MRN: [id number] DOB:1950/06/18, 73 y.o., female Today's Date: 12/27/2023  END OF SESSION:  PT End of Session - 12/27/23 0937     Visit Number 1    Number of Visits 4    Date for Recertification  01/24/24    Authorization Type [employer]    Authorization Time Period please check auth    PT Start Time 0940    PT Stop Time 1020    PT Time Calculation (min) 40 min    Activity Tolerance Patient tolerated treatment well    Behavior During Therapy WFL for tasks assessed/performed          Past Medical History:  Diagnosis Date   GERD (gastroesophageal reflux disease)    Hypercholesteremia    Hypertension    Hypothyroid    Insomnia    Left knee DJD    Left shoulder pain    Lymphocytic colitis    Past Surgical History:  Procedure Laterality Date   BACK SURGERY     CESAREAN SECTION  1987   CESAREAN SECTION  1990   COLONOSCOPY     EYE SURGERY Bilateral    cataract   JOINT REPLACEMENT Left    KNEE ARTHROSCOPY W/ MENISCECTOMY Left 2014   Dr Duwayne   low back surgery  1997   Dr Mora   low back surgery  1994   Dr Drury   RETINAL DETACHMENT SURGERY Right 2013   SHOULDER ARTHROSCOPY WITH ROTATOR CUFF REPAIR Left 05/22/2019   Procedure: LEFT SHOULDER ARTHROSCOPY WITH ROTATOR CUFF REPAIR;  Surgeon: Cristy Bonner DASEN, MD;  Location: [facility];  Service: Orthopedics;  Laterality: Left;   SHOULDER ARTHROSCOPY WITH SUBACROMIAL DECOMPRESSION AND BICEP TENDON REPAIR Left 05/22/2019   Procedure: LEFT SHOULDER ARTHROSCOPY  DEBRIDEMENT WITH SUBACROMIAL DECOMPRESSION AND BICEP TENODESIS;  Surgeon: Cristy Bonner DASEN, MD;  Location: [facility];  Service: Orthopedics;  Laterality: Left;   TOTAL KNEE ARTHROPLASTY Left 09/09/2013   Procedure: LEFT TOTAL KNEE ARTHROPLASTY;  Surgeon: Lamar DELENA Millman, MD;  Location: [facility];  Service: Orthopedics;  Laterality: Left;   Patient Active  Problem List   Diagnosis Date Noted   DJD (degenerative joint disease) of knee 09/09/2013   Insomnia    Depression with anxiety    Hypothyroid    Lymphocytic colitis    Left knee DJD     PCP: Lenora Harvey, MD REFERRING PROVIDER: Tobie Eldora NOVAK, MD  REFERRING DIAG: H81.10 (ICD-10-CM) - Benign paroxysmal positional vertigo, unspecified laterality H90.3 (ICD-10-CM) - Sensorineural hearing loss, bilateral H69.91 (ICD-10-CM) - Dysfunction of right eustachian tube  THERAPY DIAG:  Dizziness and giddiness  ONSET DATE: Jan 2025  Rationale for Evaluation and Treatment: Rehabilitation  SUBJECTIVE:   SUBJECTIVE STATEMENT: Started January 2025; was at a store and went to look up at something on a shelf; dizziness came on and was really bad; then went away but comes and goes.  Intermittent. Since July has an episode every day; getting up at out of bed and getting into bed or laying on couch; looking down and back seem to set it off as well.  Cardiologist ruled out carotid artery involvement Pt accompanied by: self  PERTINENT HISTORY: osteopenia, depression, anxiety, HTN, afib, former smoker  PAIN:  Are you having pain? No  PRECAUTIONS: None  WEIGHT BEARING RESTRICTIONS: No  FALLS: Has patient fallen in last 6 months? No  PLOF: Independent  PATIENT GOALS:  to not be dizzy  OBJECTIVE:  Note: Objective measures were completed at Evaluation unless otherwise noted.  DIAGNOSTIC FINDINGS:   COGNITION: Overall cognitive status: Within functional limits for tasks assessed   SENSATION: WFL  EDEMA:  None noted  MUSCLE TONE:  Appears wfl  POSTURE:  rounded shoulders and forward head  Cervical ROM:  grossly wfl; guarded movement with eyes closed; noted slight decrease in left cervical rotation versus right  Active AROM (deg) eval  Flexion   Extension   Right lateral flexion   Left lateral flexion   Right rotation   Left rotation   (Blank rows = not  tested)  STRENGTH: not tested  LOWER EXTREMITY MMT:   MMT Right eval Left eval  Hip flexion    Hip abduction    Hip adduction    Hip internal rotation    Hip external rotation    Knee flexion    Knee extension    Ankle dorsiflexion    Ankle plantarflexion    Ankle inversion    Ankle eversion    (Blank rows = not tested)  BED MOBILITY:  Sit to supine CGA Supine to sit CGA Rolling to Right CGA    FUNCTIONAL TESTS:  5 times sit to stand: next visit  PATIENT SURVEYS:  DHI: THE DIZZINESS HANDICAP INVENTORY (DHI)  P1. Does looking up increase your problem? 4 = Yes  E2. Because of your problem, do you feel frustrated? 4 = Yes  F3. Because of your problem, do you restrict your travel for business or recreation?  4 = Yes  P4. Does walking down the aisle of a supermarket increase your problems?  0 = No  F5. Because of your problem, do you have difficulty getting into or out of bed?  4 = Yes  F6. Does your problem significantly restrict your participation in social activities, such as going out to dinner, going to the movies, dancing, or going to parties? 4 = Yes  F7. Because of your problem, do you have difficulty reading?  2 = Sometimes  P8. Does performing more ambitious activities such as sports, dancing, household chores (sweeping or putting dishes away) increase your problems?  4 = Yes  E9. Because of your problem, are you afraid to leave your home without having without having someone accompany you?  2 = Sometimes  E10. Because of your problem have you been embarrassed in front of others?  0 = No  P11. Do quick movements of your head increase your problem?  4 = Yes  F12. Because of your problem, do you avoid heights?  4 = Yes  P13. Does turning over in bed increase your problem?  4 = Yes  F14. Because of your problem, is it difficult for you to do strenuous homework or yard work? 4 = Yes  E15. Because of your problem, are you afraid people may think you are intoxicated? 0  = No  F16. Because of your problem, is it difficult for you to go for a walk by yourself?  0 = No  P17. Does walking down a sidewalk increase your problem?  0 = No  E18.Because of your problem, is it difficult for you to concentrate 4 = Yes  F19. Because of your problem, is it difficult for you to walk around your house in the dark? 4 = Yes  E20. Because of your problem, are you afraid to stay home alone?  0 = No  E21. Because of your problem, do  you feel handicapped? 0 = No  E22. Has the problem placed stress on your relationships with members of your family or friends? 4 = Yes  E23. Because of your problem, are you depressed?  2 = Sometimes  F24. Does your problem interfere with your job or household responsibilities?  4 = Yes  P25. Does bending over increase your problem?  4 = Yes  TOTAL 66    DHI Scoring Instructions  The patient is asked to answer each question as it pertains to dizziness or unsteadiness problems, specifically  considering their condition during the last month. Questions are designed to incorporate functional (F), physical  (P), and emotional (E) impacts on disability.   Scores greater than 10 points should be referred to balance specialists for further evaluation.   16-34 Points (mild handicap)  36-52 Points (moderate handicap)  54+ Points (severe handicap)  Minimally Detectable Change: 17 points ([address] [place], 1990)  [place], G. SHAUNNA. and [place], C. W. (1990). The development of the Dizziness Handicap Inventory. Archives of Otolaryngology - Head and Neck Surgery 116(4): [id number].   VESTIBULAR ASSESSMENT:  GENERAL OBSERVATION: glasses   SYMPTOM BEHAVIOR:  Subjective history: see above  Non-Vestibular symptoms: none  Type of dizziness: Spinning/Vertigo and Unsteady with head/body turns  Frequency: daily  Duration: short 10 to 20 sec  Aggravating factors: Induced by position change: lying supine, rolling to the right, rolling to the left, supine to sit,  and sit to stand and Induced by motion: looking up at the ceiling and turning head quickly  Relieving factors: closing eyes and rest  Progression of symptoms: worse  OCULOMOTOR EXAM:  Ocular Alignment: normal  Ocular ROM: No Limitations  Spontaneous Nystagmus: absent  Gaze-Induced Nystagmus: absent  Smooth Pursuits: not tested  Saccades: not tested  Convergence/Divergence: not tested cm   Cover-cross-cover test: not tested    VESTIBULAR - OCULAR REFLEX:   Not tested POSITIONAL TESTING: Left Dix-Hallpike: upbeating, left nystagmus  MOTION SENSITIVITY:  Motion Sensitivity Quotient Intensity: 0 = none, 1 = Lightheaded, 2 = Mild, 3 = Moderate, 4 = Severe, 5 = Vomiting  Intensity  1. Sitting to supine   2. Supine to L side   3. Supine to R side   4. Supine to sitting   5. L Hallpike-Dix   6. Up from L    7. R Hallpike-Dix   8. Up from R    9. Sitting, head tipped to L knee   10. Head up from L knee   11. Sitting, head tipped to R knee   12. Head up from R knee   13. Sitting head turns x5   14.Sitting head nods x5   15. In stance, 180 turn to L    16. In stance, 180 turn to R     OTHOSTATICS: not done  FUNCTIONAL GAIT: 5 times sit to stand: next visit  TREATMENT DATE: 12/27/2023 physical therapy evaluation and HEP instruction   Canalith Repositioning:  Epley Left: Number of Reps: 1, Response to Treatment: symptoms improved, and Comment: patient emotional after treatment this is the first time in a long time I have been able to sit up without being dizzy Gaze Adaptation:   Habituation:   Other:   PATIENT EDUCATION: Education details: Patient educated on exam findings, POC, scope of PT, HEP, and BPPV handouts; education. Person educated: Patient Education method: Explanation, Demonstration, and Handouts Education comprehension: verbalized  understanding, returned demonstration, verbal cues required, and tactile cues required  HOME EXERCISE PROGRAM:  GOALS: Goals reviewed with patient? No  SHORT TERM GOALS: Target date: 01/10/2024  Patient will report 50% improvement overall  Baseline: Goal status: INITIAL  2.  Patient will have a full week without any vertigo with going to bed or getting out of bed Baseline:  Goal status: INITIAL    LONG TERM GOALS: Target date: 01/24/2024  Patient will report 80% improvement overall  Baseline:  Goal status: INITIAL  2.  Patient will improve DHI score to 10 or less to demonstrate decreased dizziness with functional activities Baseline: 66/100 Goal status: INITIAL   ASSESSMENT:  CLINICAL IMPRESSION: Patient is a 73 y.o. female who was seen today for physical therapy evaluation and treatment for H81.10 (ICD-10-CM) - Benign paroxysmal positional vertigo, unspecified laterality H90.3 (ICD-10-CM) - Sensorineural hearing loss, bilateral H69.91 (ICD-10-CM) - Dysfunction of right eustachian tube. Patient demonstrates increased vestibular symptoms with provocative testing which is negatively impacting patient ability to perform ADLs and functional mobility tasks. Patient will benefit from skilled physical therapy services to address these deficits to improve level of function with ADLs, functional mobility tasks, and reduce risk for falls.    OBJECTIVE IMPAIRMENTS: decreased knowledge of condition and dizziness.   ACTIVITY LIMITATIONS: bending and caring for others  PARTICIPATION LIMITATIONS: interpersonal relationship, driving, and shopping  PERSONAL FACTORS: 1-2 comorbidities: afib, HTN are also affecting patient's functional outcome.   REHAB POTENTIAL: Good  CLINICAL DECISION MAKING: Evolving/moderate complexity  EVALUATION COMPLEXITY: Moderate   PLAN:  PT FREQUENCY: 1x/week  PT DURATION: 4 weeks  PLANNED INTERVENTIONS: 97164- PT Re-evaluation, 97110-Therapeutic  exercises, 97530- Therapeutic activity, 97112- Neuromuscular re-education, 97535- Self Care, 02859- Manual therapy, [id number]- Gait training, [phone]- Orthotic Fit/training, [phone]- Canalith repositioning, [id number]- Aquatic Therapy, [phone]- Splinting, [phone]- Wound care (first 20 sq cm), 97598- Wound care (each additional 20 sq cm)Patient/Family education, Balance training, Stair training, Taping, Dry Needling, Joint mobilization, Joint manipulation, Spinal manipulation, Spinal mobilization, Scar mobilization, and DME instructions.   PLAN FOR NEXT SESSION: Review goals and reaction to Epley repositioning maneuver for left canal; retest left canal; test right posterior canal   10:35 AM, 12/27/23 Demarr Kluever Small Leighanna Kirn MPT [facility] [place] [phone] Ph:[phone]  [employer] Auth Request Treatment Start Date: 12/27/2023  Referring diagnosis code (ICD 10)? H81.10 (ICD-10-CM) - Benign paroxysmal positional vertigo, unspecified laterality H90.3 (ICD-10-CM) - Sensorineural hearing loss, bilateral H69.91 (ICD-10-CM) - Dysfunction of right eustachian tube Treatment diagnosis codes (ICD 10)? (if different than referring diagnosis) R42 What was this (referring dx) caused by? []  Surgery []  Fall [x]  Ongoing issue []  Arthritis []  Other: ____________  Laterality: []  Rt []  Lt [x]  Both  Deficits: []  Pain [x]  Stiffness []  Weakness []  Edema []  Balance Deficits []  Coordination []  Gait Disturbance []  ROM [x]  Other   Functional Tool Score: DHI 66/100  CPT codes: See Planned Interventions listed in the Plan section of the Evaluation.

## 2023-12-28 ENCOUNTER — Ambulatory Visit: Payer: Self-pay | Admitting: Cardiology

## 2023-12-29 NOTE — Telephone Encounter (Signed)
 REQUESTING OFFICE SENT DUPLICATE INQUIRING IF PATIENT HAVE BEEN CLEARED. PT SEEN DR. MICHELE ON 12/15/23. LOOKS LIKE PATIENT CLEARED IN NOTES BUT DO NOT SEE RECOMMENDATIONS FOR XARELTO  . WILL FORWARD BACK TO PREOP APP FOR REVIEW.

## 2023-12-29 NOTE — Telephone Encounter (Signed)
   Patient Name: Theresa Reese  DOB: 1950/06/03 MRN: [id number]  Primary Cardiologist: Madonna Large, DO  Chart reviewed as part of pre-operative protocol coverage. Given past medical history and time since last visit, based on ACC/AHA guidelines, Theresa Reese is at acceptable risk for the planned procedure without further cardiovascular testing.   Per Dr.Tolia 12/15/2023 According to the Revised Cardiac Risk Index (RCRI), her Perioperative Risk of Major Cardiac Event is (%): 0.4 Her Functional Capacity in METs is: 7.25 according to the [facility] Activity Status Index (DASI).  Okay to hold Xarelto  for 2 days  Will not need bridging with enoxaparin.   The patient was advised that if she develops new symptoms prior to surgery to contact our office to arrange for a follow-up visit, and she verbalized understanding.  I will route this recommendation to the requesting party via Epic fax function and remove from pre-op pool.  Please call with questions.  Lamarr Satterfield, NP 12/29/2023, 11:46 AM

## 2023-12-29 NOTE — Progress Notes (Signed)
 Patient viewed in [place]      Last read by [name] at 7:55PM on 12/28/2023.

## 2024-01-02 ENCOUNTER — Encounter (HOSPITAL_COMMUNITY)

## 2024-01-04 ENCOUNTER — Ambulatory Visit
Admission: RE | Admit: 2024-01-04 | Discharge: 2024-01-04 | Disposition: A | Discharge status: Home / Self Care | Source: Ambulatory Visit | Attending: Family Medicine | Admitting: Family Medicine

## 2024-01-04 DIAGNOSIS — Z1231 Encounter for screening mammogram for malignant neoplasm of breast: Secondary | ICD-10-CM

## 2024-01-10 ENCOUNTER — Other Ambulatory Visit: Payer: Self-pay | Admitting: Family Medicine

## 2024-01-10 DIAGNOSIS — R928 Other abnormal and inconclusive findings on diagnostic imaging of breast: Secondary | ICD-10-CM

## 2024-01-16 DIAGNOSIS — D124 Benign neoplasm of descending colon: Secondary | ICD-10-CM | POA: Diagnosis not present

## 2024-01-16 DIAGNOSIS — Z09 Encounter for follow-up examination after completed treatment for conditions other than malignant neoplasm: Secondary | ICD-10-CM | POA: Diagnosis not present

## 2024-01-16 DIAGNOSIS — D123 Benign neoplasm of transverse colon: Secondary | ICD-10-CM | POA: Diagnosis not present

## 2024-01-16 DIAGNOSIS — Z860101 Personal history of adenomatous and serrated colon polyps: Secondary | ICD-10-CM | POA: Diagnosis not present

## 2024-01-16 DIAGNOSIS — K648 Other hemorrhoids: Secondary | ICD-10-CM | POA: Diagnosis not present

## 2024-01-16 DIAGNOSIS — D125 Benign neoplasm of sigmoid colon: Secondary | ICD-10-CM | POA: Diagnosis not present

## 2024-01-17 ENCOUNTER — Ambulatory Visit (HOSPITAL_COMMUNITY)

## 2024-01-17 ENCOUNTER — Encounter (HOSPITAL_COMMUNITY): Payer: Self-pay

## 2024-01-17 DIAGNOSIS — R42 Dizziness and giddiness: Secondary | ICD-10-CM | POA: Diagnosis not present

## 2024-01-17 DIAGNOSIS — H8110 Benign paroxysmal vertigo, unspecified ear: Secondary | ICD-10-CM | POA: Diagnosis not present

## 2024-01-17 DIAGNOSIS — H903 Sensorineural hearing loss, bilateral: Secondary | ICD-10-CM | POA: Diagnosis not present

## 2024-01-17 DIAGNOSIS — H6991 Unspecified Eustachian tube disorder, right ear: Secondary | ICD-10-CM | POA: Diagnosis not present

## 2024-01-17 NOTE — Therapy (Signed)
 OUTPATIENT PHYSICAL THERAPY VESTIBULAR TREATMENT     Patient Name: Theresa Reese MRN: [id number] DOB:[birth year], 73 y.o., female Today's Date: 01/17/2024  END OF SESSION:  PT End of Session - 01/17/24 1414     Visit Number 2    Number of Visits 4    Date for Recertification  01/24/24    Authorization Type [employer]    Authorization Time Period 8 visits from 10/08-01/06    Progress Note Due on Visit 4    PT Start Time 1415    Activity Tolerance Patient tolerated treatment well    Behavior During Therapy [facility] for tasks assessed/performed           Past Medical History:  Diagnosis Date   GERD (gastroesophageal reflux disease)    Hypercholesteremia    Hypertension    Hypothyroid    Insomnia    Left knee DJD    Left shoulder pain    Lymphocytic colitis    Past Surgical History:  Procedure Laterality Date   BACK SURGERY     CESAREAN SECTION  1987   CESAREAN SECTION  1990   COLONOSCOPY     EYE SURGERY Bilateral    cataract   JOINT REPLACEMENT Left    KNEE ARTHROSCOPY W/ MENISCECTOMY Left 2014   Dr Duwayne   low back surgery  1997   Dr Mora   low back surgery  1994   Dr Drury   RETINAL DETACHMENT SURGERY Right 2013   SHOULDER ARTHROSCOPY WITH ROTATOR CUFF REPAIR Left 05/22/2019   Procedure: LEFT SHOULDER ARTHROSCOPY WITH ROTATOR CUFF REPAIR;  Surgeon: Cristy Bonner DASEN, MD;  Location: [facility];  Service: Orthopedics;  Laterality: Left;   SHOULDER ARTHROSCOPY WITH SUBACROMIAL DECOMPRESSION AND BICEP TENDON REPAIR Left 05/22/2019   Procedure: LEFT SHOULDER ARTHROSCOPY  DEBRIDEMENT WITH SUBACROMIAL DECOMPRESSION AND BICEP TENODESIS;  Surgeon: Cristy Bonner DASEN, MD;  Location: [facility];  Service: Orthopedics;  Laterality: Left;   TOTAL KNEE ARTHROPLASTY Left 09/09/2013   Procedure: LEFT TOTAL KNEE ARTHROPLASTY;  Surgeon: Lamar DELENA Millman, MD;  Location: [facility];  Service: Orthopedics;  Laterality: Left;   Patient Active Problem List    Diagnosis Date Noted   DJD (degenerative joint disease) of knee 09/09/2013   Insomnia    Depression with anxiety    Hypothyroid    Lymphocytic colitis    Left knee DJD     PCP: Lenora Harvey, MD REFERRING PROVIDER: Tobie Eldora NOVAK, MD  REFERRING DIAG: H81.10 (ICD-10-CM) - Benign paroxysmal positional vertigo, unspecified laterality H90.3 (ICD-10-CM) - Sensorineural hearing loss, bilateral H69.91 (ICD-10-CM) - Dysfunction of right eustachian tube  THERAPY DIAG:  Dizziness and giddiness  ONSET DATE: Jan 2025  Rationale for Evaluation and Treatment: Rehabilitation  SUBJECTIVE:   SUBJECTIVE STATEMENT: Pt states the dizziness has been better since the first visit. Pt states she is still struggling getting in and out of the bed but the episodes are not lasting as long, the episode getting up lasts for about 3 minutes.  Eval: Started January 2025; was at a store and went to look up at something on a shelf; dizziness came on and was really bad; then went away but comes and goes.  Intermittent. Since July has an episode every day; getting up at out of bed and getting into bed or laying on couch; looking down and back seem to set it off as well.  Cardiologist ruled out carotid artery involvement Pt accompanied by: self  PERTINENT HISTORY: osteopenia,  depression, anxiety, HTN, afib, former smoker  PAIN:  Are you having pain? No  PRECAUTIONS: None  WEIGHT BEARING RESTRICTIONS: No  FALLS: Has patient fallen in last 6 months? No  PLOF: Independent  PATIENT GOALS: to not be dizzy  OBJECTIVE:  Note: Objective measures were completed at Evaluation unless otherwise noted.  DIAGNOSTIC FINDINGS:   COGNITION: Overall cognitive status: Within functional limits for tasks assessed   SENSATION: WFL  EDEMA:  None noted  MUSCLE TONE:  Appears wfl  POSTURE:  rounded shoulders and forward head  Cervical ROM:  grossly wfl; guarded movement with eyes closed; noted slight decrease  in left cervical rotation versus right  Active AROM (deg) eval  Flexion   Extension   Right lateral flexion   Left lateral flexion   Right rotation   Left rotation   (Blank rows = not tested)  STRENGTH: not tested  LOWER EXTREMITY MMT:   MMT Right eval Left eval  Hip flexion    Hip abduction    Hip adduction    Hip internal rotation    Hip external rotation    Knee flexion    Knee extension    Ankle dorsiflexion    Ankle plantarflexion    Ankle inversion    Ankle eversion    (Blank rows = not tested)  BED MOBILITY:  Sit to supine CGA Supine to sit CGA Rolling to Right CGA    FUNCTIONAL TESTS:  5 times sit to stand: next visit  PATIENT SURVEYS:  DHI: THE DIZZINESS HANDICAP INVENTORY (DHI)  P1. Does looking up increase your problem? 4 = Yes  E2. Because of your problem, do you feel frustrated? 4 = Yes  F3. Because of your problem, do you restrict your travel for business or recreation?  4 = Yes  P4. Does walking down the aisle of a supermarket increase your problems?  0 = No  F5. Because of your problem, do you have difficulty getting into or out of bed?  4 = Yes  F6. Does your problem significantly restrict your participation in social activities, such as going out to dinner, going to the movies, dancing, or going to parties? 4 = Yes  F7. Because of your problem, do you have difficulty reading?  2 = Sometimes  P8. Does performing more ambitious activities such as sports, dancing, household chores (sweeping or putting dishes away) increase your problems?  4 = Yes  E9. Because of your problem, are you afraid to leave your home without having without having someone accompany you?  2 = Sometimes  E10. Because of your problem have you been embarrassed in front of others?  0 = No  P11. Do quick movements of your head increase your problem?  4 = Yes  F12. Because of your problem, do you avoid heights?  4 = Yes  P13. Does turning over in bed increase your problem?  4 =  Yes  F14. Because of your problem, is it difficult for you to do strenuous homework or yard work? 4 = Yes  E15. Because of your problem, are you afraid people may think you are intoxicated? 0 = No  F16. Because of your problem, is it difficult for you to go for a walk by yourself?  0 = No  P17. Does walking down a sidewalk increase your problem?  0 = No  E18.Because of your problem, is it difficult for you to concentrate 4 = Yes  F19. Because of your problem, is it  difficult for you to walk around your house in the dark? 4 = Yes  E20. Because of your problem, are you afraid to stay home alone?  0 = No  E21. Because of your problem, do you feel handicapped? 0 = No  E22. Has the problem placed stress on your relationships with members of your family or friends? 4 = Yes  E23. Because of your problem, are you depressed?  2 = Sometimes  F24. Does your problem interfere with your job or household responsibilities?  4 = Yes  P25. Does bending over increase your problem?  4 = Yes  TOTAL 66    DHI Scoring Instructions  The patient is asked to answer each question as it pertains to dizziness or unsteadiness problems, specifically  considering their condition during the last month. Questions are designed to incorporate functional (F), physical  (P), and emotional (E) impacts on disability.   Scores greater than 10 points should be referred to balance specialists for further evaluation.   16-34 Points (mild handicap)  36-52 Points (moderate handicap)  54+ Points (severe handicap)  Minimally Detectable Change: 17 points ([address] [place], 1990)  [place], G. SHAUNNA. and [place]weese, C. W. (1990). The development of the Dizziness Handicap Inventory. Archives of Otolaryngology - Head and Neck Surgery 116(4): [id number].   VESTIBULAR ASSESSMENT:  GENERAL OBSERVATION: glasses   SYMPTOM BEHAVIOR:  Subjective history: see above  Non-Vestibular symptoms: none  Type of dizziness: Spinning/Vertigo and Unsteady  with head/body turns  Frequency: daily  Duration: short 10 to 20 sec  Aggravating factors: Induced by position change: lying supine, rolling to the right, rolling to the left, supine to sit, and sit to stand and Induced by motion: looking up at the ceiling and turning head quickly  Relieving factors: closing eyes and rest  Progression of symptoms: worse  OCULOMOTOR EXAM:  Ocular Alignment: normal  Ocular ROM: No Limitations  Spontaneous Nystagmus: absent  Gaze-Induced Nystagmus: absent  Smooth Pursuits: normal  Saccades: normal except for vertical corrective saccades noted  Convergence/Divergence: not tested cm   Cover-cross-cover test: not tested    VESTIBULAR - OCULAR REFLEX:   Not tested POSITIONAL TESTING: Left Dix-Hallpike: upbeating, left nystagmus  MOTION SENSITIVITY:  Motion Sensitivity Quotient Intensity: 0 = none, 1 = Lightheaded, 2 = Mild, 3 = Moderate, 4 = Severe, 5 = Vomiting  Intensity  1. Sitting to supine   2. Supine to L side   3. Supine to R side   4. Supine to sitting   5. L Hallpike-Dix   6. Up from L    7. R Hallpike-Dix   8. Up from R    9. Sitting, head tipped to L knee   10. Head up from L knee   11. Sitting, head tipped to R knee   12. Head up from R knee   13. Sitting head turns x5   14.Sitting head nods x5   15. In stance, 180 turn to L    16. In stance, 180 turn to R     OTHOSTATICS: not done  FUNCTIONAL GAIT: 5 times sit to stand: next visit  TREATMENT DATE:  01/17/2024  Pt educated on cervical HEP, to be completed once dizziness is subsided tomorrow. Pt educated on possible anterior canal involvement. Canalith repositioning: -Epley maneuver of Left posterior canal, up beating torsional nystagmus in 1st and 2nd position, worse on 1st position -Second Epley trial for left posterior canal, down beating torsional  nystagmus present in position 1, decreased pronouncement of nystagmus and   12/27/2023 physical therapy evaluation and HEP instruction   Canalith Repositioning:  Epley Left: Number of Reps: 1, Response to Treatment: symptoms improved, and Comment: patient emotional after treatment this is the first time in a long time I have been able to sit up without being dizzy Gaze Adaptation:   Habituation:   Other:   PATIENT EDUCATION: Education details: Patient educated on exam findings, POC, scope of PT, HEP, and BPPV handouts; education. Person educated: Patient Education method: Explanation, Demonstration, and Handouts Education comprehension: verbalized understanding, returned demonstration, verbal cues required, and tactile cues required  HOME EXERCISE PROGRAM: Access Code: [email or web address] URL: [email or web address] Date: 01/17/2024 Prepared by: Lang Ada  Exercises - Cervical Extension AROM with Strap  - 1 x daily - 7 x weekly - 3 sets - 10 reps - Seated Cervical Rotation AROM  - 1 x daily - 7 x weekly - 3 sets - 10 reps - Standing Cervical Sidebending AROM  - 1 x daily - 7 x weekly - 3 sets - 10 reps - Seated Cervical Retraction  - 1 x daily - 7 x weekly - 3 sets - 10 reps - 3 hold - Seated Upper Trapezius Stretch  - 1 x daily - 7 x weekly - 1 sets - 3 reps - 20 hold  GOALS: Goals reviewed with patient? No  SHORT TERM GOALS: Target date: 01/10/2024  Patient will report 50% improvement overall  Baseline: Goal status: INITIAL  2.  Patient will have a full week without any vertigo with going to bed or getting out of bed Baseline:  Goal status: INITIAL    LONG TERM GOALS: Target date: 01/24/2024  Patient will report 80% improvement overall  Baseline:  Goal status: INITIAL  2.  Patient will improve DHI score to 10 or less to demonstrate decreased dizziness with functional activities Baseline: 66/100 Goal status: INITIAL   ASSESSMENT:  CLINICAL  IMPRESSION: Patient continues to demonstrate increased dizziness with left [facility], decreased cervical ROM and impaired functional mobility. Patient also demonstrates down beating torsional nystagmus during today's session, possible involvement of R posterior canal or L anterior canal, plan to check R posterior canal first next session. Patient demonstrates decreased nystagmus and reported less dizziness symptoms on second rep of Epley maneuver. Patient would continue to benefit from skilled physical therapy for decreased symptoms of BPPV, increased cervical ROM, and increased independence with canalith repositioning as differential canals are narrowed down for improved quality of life, improved independence with ADLs and continued progress towards therapy goals.   Eval: Patient is a 73 y.o. female who was seen today for physical therapy evaluation and treatment for H81.10 (ICD-10-CM) - Benign paroxysmal positional vertigo, unspecified laterality H90.3 (ICD-10-CM) - Sensorineural hearing loss, bilateral H69.91 (ICD-10-CM) - Dysfunction of right eustachian tube. Patient demonstrates increased vestibular symptoms with provocative testing which is negatively impacting patient ability to perform ADLs and functional mobility tasks. Patient will benefit from skilled physical therapy services to address these deficits to improve level of function with ADLs, functional mobility tasks, and reduce risk for falls.    OBJECTIVE IMPAIRMENTS: decreased knowledge  of condition and dizziness.   ACTIVITY LIMITATIONS: bending and caring for others  PARTICIPATION LIMITATIONS: interpersonal relationship, driving, and shopping  PERSONAL FACTORS: 1-2 comorbidities: afib, HTN are also affecting patient's functional outcome.   REHAB POTENTIAL: Good  CLINICAL DECISION MAKING: Evolving/moderate complexity  EVALUATION COMPLEXITY: Moderate   PLAN:  PT FREQUENCY: 1x/week  PT DURATION: 4 weeks  PLANNED  INTERVENTIONS: 97164- PT Re-evaluation, 97110-Therapeutic exercises, 97530- Therapeutic activity, 97112- Neuromuscular re-education, 97535- Self Care, [id number]- Manual therapy, [id number]- Gait training, [phone]- Orthotic Fit/training, [phone]- Canalith repositioning, [id number]- Aquatic Therapy, [phone]- Splinting, [phone]- Wound care (first 20 sq cm), 97598- Wound care (each additional 20 sq cm)Patient/Family education, Balance training, Stair training, Taping, Dry Needling, Joint mobilization, Joint manipulation, Spinal manipulation, Spinal mobilization, Scar mobilization, and DME instructions.   PLAN FOR NEXT SESSION: Test R posterior canal/ L anterior canals as pt demonstrates down beating torsional nystagmus with left dix hall pike 10/29, track cervical HEP, possible extend if dizziness symptoms persist   Lang Ada, PT, DPT [facility] Office: [phone] 4:53 PM, 01/17/24

## 2024-01-18 DIAGNOSIS — D124 Benign neoplasm of descending colon: Secondary | ICD-10-CM | POA: Diagnosis not present

## 2024-01-18 DIAGNOSIS — D123 Benign neoplasm of transverse colon: Secondary | ICD-10-CM | POA: Diagnosis not present

## 2024-01-18 DIAGNOSIS — D125 Benign neoplasm of sigmoid colon: Secondary | ICD-10-CM | POA: Diagnosis not present

## 2024-01-22 ENCOUNTER — Encounter (HOSPITAL_COMMUNITY): Payer: Self-pay

## 2024-01-22 ENCOUNTER — Ambulatory Visit (HOSPITAL_COMMUNITY): Attending: Otolaryngology

## 2024-01-22 DIAGNOSIS — R42 Dizziness and giddiness: Secondary | ICD-10-CM | POA: Insufficient documentation

## 2024-01-22 NOTE — Therapy (Signed)
 OUTPATIENT PHYSICAL THERAPY VESTIBULAR TREATMENT     Patient Name: Theresa Reese MRN: [id number] DOB:Nov 11, 1950, 73 y.o., female Today's Date: 01/22/2024  END OF SESSION:  PT End of Session - 01/22/24 1405     Visit Number 3    Number of Visits 4    Date for Recertification  01/24/24    Authorization Type [employer]    Authorization Time Period 8 visits from 10/08-01/06    Progress Note Due on Visit 4    PT Start Time 1405    PT Stop Time 1439    PT Time Calculation (min) 34 min    Activity Tolerance Patient tolerated treatment well    Behavior During Therapy WFL for tasks assessed/performed            Past Medical History:  Diagnosis Date   GERD (gastroesophageal reflux disease)    Hypercholesteremia    Hypertension    Hypothyroid    Insomnia    Left knee DJD    Left shoulder pain    Lymphocytic colitis    Past Surgical History:  Procedure Laterality Date   BACK SURGERY     CESAREAN SECTION  1987   CESAREAN SECTION  1990   COLONOSCOPY     EYE SURGERY Bilateral    cataract   JOINT REPLACEMENT Left    KNEE ARTHROSCOPY W/ MENISCECTOMY Left 2014   Dr Duwayne   low back surgery  1997   Dr Mora   low back surgery  1994   Dr Drury   RETINAL DETACHMENT SURGERY Right 2013   SHOULDER ARTHROSCOPY WITH ROTATOR CUFF REPAIR Left 05/22/2019   Procedure: LEFT SHOULDER ARTHROSCOPY WITH ROTATOR CUFF REPAIR;  Surgeon: Cristy Bonner DASEN, MD;  Location: [facility];  Service: Orthopedics;  Laterality: Left;   SHOULDER ARTHROSCOPY WITH SUBACROMIAL DECOMPRESSION AND BICEP TENDON REPAIR Left 05/22/2019   Procedure: LEFT SHOULDER ARTHROSCOPY  DEBRIDEMENT WITH SUBACROMIAL DECOMPRESSION AND BICEP TENODESIS;  Surgeon: Cristy Bonner DASEN, MD;  Location: [facility];  Service: Orthopedics;  Laterality: Left;   TOTAL KNEE ARTHROPLASTY Left 09/09/2013   Procedure: LEFT TOTAL KNEE ARTHROPLASTY;  Surgeon: Lamar DELENA Millman, MD;  Location: [facility];  Service: Orthopedics;   Laterality: Left;   Patient Active Problem List   Diagnosis Date Noted   DJD (degenerative joint disease) of knee 09/09/2013   Insomnia    Depression with anxiety    Hypothyroid    Lymphocytic colitis    Left knee DJD     PCP: Lenora Harvey, MD REFERRING PROVIDER: Tobie Eldora NOVAK, MD  REFERRING DIAG: H81.10 (ICD-10-CM) - Benign paroxysmal positional vertigo, unspecified laterality H90.3 (ICD-10-CM) - Sensorineural hearing loss, bilateral H69.91 (ICD-10-CM) - Dysfunction of right eustachian tube  THERAPY DIAG:  Dizziness and giddiness  ONSET DATE: Jan 2025  Rationale for Evaluation and Treatment: Rehabilitation  SUBJECTIVE:   SUBJECTIVE STATEMENT: Patient reports that today is the worst day since her last appointment. She feels that it is about a 7/10 as she cannot look up or down today.   Eval: Started January 2025; was at a store and went to look up at something on a shelf; dizziness came on and was really bad; then went away but comes and goes.  Intermittent. Since July has an episode every day; getting up at out of bed and getting into bed or laying on couch; looking down and back seem to set it off as well.  Cardiologist ruled out carotid artery involvement Pt accompanied by:  self  PERTINENT HISTORY: osteopenia, depression, anxiety, HTN, afib, former smoker  PAIN:  Are you having pain? No  PRECAUTIONS: None  WEIGHT BEARING RESTRICTIONS: No  FALLS: Has patient fallen in last 6 months? No  PLOF: Independent  PATIENT GOALS: to not be dizzy  OBJECTIVE:  Note: Objective measures were completed at Evaluation unless otherwise noted.  DIAGNOSTIC FINDINGS:   COGNITION: Overall cognitive status: Within functional limits for tasks assessed   SENSATION: WFL  EDEMA:  None noted  MUSCLE TONE:  Appears wfl  POSTURE:  rounded shoulders and forward head  Cervical ROM:  grossly wfl; guarded movement with eyes closed; noted slight decrease in left cervical  rotation versus right  Active AROM (deg) eval  Flexion   Extension   Right lateral flexion   Left lateral flexion   Right rotation   Left rotation   (Blank rows = not tested)  STRENGTH: not tested  LOWER EXTREMITY MMT:   MMT Right eval Left eval  Hip flexion    Hip abduction    Hip adduction    Hip internal rotation    Hip external rotation    Knee flexion    Knee extension    Ankle dorsiflexion    Ankle plantarflexion    Ankle inversion    Ankle eversion    (Blank rows = not tested)  BED MOBILITY:  Sit to supine CGA Supine to sit CGA Rolling to Right CGA    FUNCTIONAL TESTS:  5 times sit to stand: next visit  PATIENT SURVEYS:  DHI: THE DIZZINESS HANDICAP INVENTORY (DHI)  P1. Does looking up increase your problem? 4 = Yes  E2. Because of your problem, do you feel frustrated? 4 = Yes  F3. Because of your problem, do you restrict your travel for business or recreation?  4 = Yes  P4. Does walking down the aisle of a supermarket increase your problems?  0 = No  F5. Because of your problem, do you have difficulty getting into or out of bed?  4 = Yes  F6. Does your problem significantly restrict your participation in social activities, such as going out to dinner, going to the movies, dancing, or going to parties? 4 = Yes  F7. Because of your problem, do you have difficulty reading?  2 = Sometimes  P8. Does performing more ambitious activities such as sports, dancing, household chores (sweeping or putting dishes away) increase your problems?  4 = Yes  E9. Because of your problem, are you afraid to leave your home without having without having someone accompany you?  2 = Sometimes  E10. Because of your problem have you been embarrassed in front of others?  0 = No  P11. Do quick movements of your head increase your problem?  4 = Yes  F12. Because of your problem, do you avoid heights?  4 = Yes  P13. Does turning over in bed increase your problem?  4 = Yes  F14.  Because of your problem, is it difficult for you to do strenuous homework or yard work? 4 = Yes  E15. Because of your problem, are you afraid people may think you are intoxicated? 0 = No  F16. Because of your problem, is it difficult for you to go for a walk by yourself?  0 = No  P17. Does walking down a sidewalk increase your problem?  0 = No  E18.Because of your problem, is it difficult for you to concentrate 4 = Yes  F19. Because  of your problem, is it difficult for you to walk around your house in the dark? 4 = Yes  E20. Because of your problem, are you afraid to stay home alone?  0 = No  E21. Because of your problem, do you feel handicapped? 0 = No  E22. Has the problem placed stress on your relationships with members of your family or friends? 4 = Yes  E23. Because of your problem, are you depressed?  2 = Sometimes  F24. Does your problem interfere with your job or household responsibilities?  4 = Yes  P25. Does bending over increase your problem?  4 = Yes  TOTAL 66    DHI Scoring Instructions  The patient is asked to answer each question as it pertains to dizziness or unsteadiness problems, specifically  considering their condition during the last month. Questions are designed to incorporate functional (F), physical  (P), and emotional (E) impacts on disability.   Scores greater than 10 points should be referred to balance specialists for further evaluation.   16-34 Points (mild handicap)  36-52 Points (moderate handicap)  54+ Points (severe handicap)  Minimally Detectable Change: 17 points ([address] [place], 1990)  Maish Vaya, G. SHAUNNA. and [place], C. W. (1990). The development of the Dizziness Handicap Inventory. Archives of Otolaryngology - Head and Neck Surgery 116(4): [id number].   VESTIBULAR ASSESSMENT:  GENERAL OBSERVATION: glasses   SYMPTOM BEHAVIOR:  Subjective history: see above  Non-Vestibular symptoms: none  Type of dizziness: Spinning/Vertigo and Unsteady with  head/body turns  Frequency: daily  Duration: short 10 to 20 sec  Aggravating factors: Induced by position change: lying supine, rolling to the right, rolling to the left, supine to sit, and sit to stand and Induced by motion: looking up at the ceiling and turning head quickly  Relieving factors: closing eyes and rest  Progression of symptoms: worse  OCULOMOTOR EXAM:  Ocular Alignment: normal  Ocular ROM: No Limitations  Spontaneous Nystagmus: absent  Gaze-Induced Nystagmus: absent  Smooth Pursuits: normal  Saccades: normal except for vertical corrective saccades noted  Convergence/Divergence: not tested cm   Cover-cross-cover test: not tested    VESTIBULAR - OCULAR REFLEX:   Not tested POSITIONAL TESTING: Left Dix-Hallpike: upbeating, left nystagmus  MOTION SENSITIVITY:  Motion Sensitivity Quotient Intensity: 0 = none, 1 = Lightheaded, 2 = Mild, 3 = Moderate, 4 = Severe, 5 = Vomiting  Intensity  1. Sitting to supine   2. Supine to L side   3. Supine to R side   4. Supine to sitting   5. L Hallpike-Dix   6. Up from L    7. R Hallpike-Dix   8. Up from R    9. Sitting, head tipped to L knee   10. Head up from L knee   11. Sitting, head tipped to R knee   12. Head up from R knee   13. Sitting head turns x5   14.Sitting head nods x5   15. In stance, 180 turn to L    16. In stance, 180 turn to R     OTHOSTATICS: not done  FUNCTIONAL GAIT: 5 times sit to stand: next visit  TREATMENT DATE:                                    01/22/24 EXERCISE LOG  Exercise Repetitions and Resistance Comments  Canalith repositioning  X1 for left posterior canal   Patient education  See below                Blank cell = exercise not performed today   01/17/2024  Pt educated on cervical HEP, to be completed once dizziness is subsided tomorrow. Pt educated on  possible anterior canal involvement. Canalith repositioning: -Epley maneuver of Left posterior canal, up beating torsional nystagmus in 1st and 2nd position, worse on 1st position -Second Epley trial for left posterior canal, down beating torsional nystagmus present in position 1, decreased pronouncement of nystagmus and   12/27/2023 physical therapy evaluation and HEP instruction   Canalith Repositioning:  Epley Left: Number of Reps: 1, Response to Treatment: symptoms improved, and Comment: patient emotional after treatment this is the first time in a long time I have been able to sit up without being dizzy Gaze Adaptation:   Habituation:   Other:   PATIENT EDUCATION: Education details: Patient educated on activity modifications, positioning, symptom presentation Person educated: Patient Education method: Explanation, Demonstration, and Handouts Education comprehension: verbalized understanding, returned demonstration, verbal cues required, and tactile cues required  HOME EXERCISE PROGRAM: Access Code: [email or web address] URL: [email or web address] Date: 01/22/2024 Prepared by: Lacinda Fass  Exercises - Self-Epley Maneuver Left Ear  - 1 x daily - 7 x weekly - 1-2 sets  Access Code: [email or web address] URL: [email or web address] Date: 01/17/2024 Prepared by: Lang Ada  Exercises - Cervical Extension AROM with Strap  - 1 x daily - 7 x weekly - 3 sets - 10 reps - Seated Cervical Rotation AROM  - 1 x daily - 7 x weekly - 3 sets - 10 reps - Standing Cervical Sidebending AROM  - 1 x daily - 7 x weekly - 3 sets - 10 reps - Seated Cervical Retraction  - 1 x daily - 7 x weekly - 3 sets - 10 reps - 3 hold - Seated Upper Trapezius Stretch  - 1 x daily - 7 x weekly - 1 sets - 3 reps - 20 hold  GOALS: Goals reviewed with patient? No  SHORT TERM GOALS: Target date: 01/10/2024  Patient will report 50% improvement overall  Baseline: Goal status: INITIAL  2.  Patient  will have a full week without any vertigo with going to bed or getting out of bed Baseline:  Goal status: INITIAL    LONG TERM GOALS: Target date: 01/24/2024  Patient will report 80% improvement overall  Baseline:  Goal status: INITIAL  2.  Patient will improve DHI score to 10 or less to demonstrate decreased dizziness with functional activities Baseline: 66/100 Goal status: INITIAL   ASSESSMENT:  CLINICAL IMPRESSION: Patient presented to treatment with elevated symptom severity. Her symptoms were able to be completely resolved after completing the Epley Maneuver for the left posterior canal. She was educated on positional and activity modifications to help limit her symptoms severity when transferring between sitting and supine. Her HEP was updated to include a self Epley Maneuver for the left posterior canal. She reported understanding and felt comfortable with this intervention. She reported no symptoms upon the conclusion of treatment. Patient continues to require skilled physical therapy to address her remaining impairments to return to her prior level  of function.     Eval: Patient is a 73 y.o. female who was seen today for physical therapy evaluation and treatment for H81.10 (ICD-10-CM) - Benign paroxysmal positional vertigo, unspecified laterality H90.3 (ICD-10-CM) - Sensorineural hearing loss, bilateral H69.91 (ICD-10-CM) - Dysfunction of right eustachian tube. Patient demonstrates increased vestibular symptoms with provocative testing which is negatively impacting patient ability to perform ADLs and functional mobility tasks. Patient will benefit from skilled physical therapy services to address these deficits to improve level of function with ADLs, functional mobility tasks, and reduce risk for falls.    OBJECTIVE IMPAIRMENTS: decreased knowledge of condition and dizziness.   ACTIVITY LIMITATIONS: bending and caring for others  PARTICIPATION LIMITATIONS: interpersonal  relationship, driving, and shopping  PERSONAL FACTORS: 1-2 comorbidities: afib, HTN are also affecting patient's functional outcome.   REHAB POTENTIAL: Good  CLINICAL DECISION MAKING: Evolving/moderate complexity  EVALUATION COMPLEXITY: Moderate   PLAN:  PT FREQUENCY: 1x/week  PT DURATION: 4 weeks  PLANNED INTERVENTIONS: 97164- PT Re-evaluation, 97110-Therapeutic exercises, 97530- Therapeutic activity, 97112- Neuromuscular re-education, 97535- Self Care, [id number]- Manual therapy, [id number]- Gait training, [phone]- Orthotic Fit/training, [phone]- Canalith repositioning, [id number]- Aquatic Therapy, [phone]- Splinting, [phone]- Wound care (first 20 sq cm), 97598- Wound care (each additional 20 sq cm)Patient/Family education, Balance training, Stair training, Taping, Dry Needling, Joint mobilization, Joint manipulation, Spinal manipulation, Spinal mobilization, Scar mobilization, and DME instructions.   PLAN FOR NEXT SESSION: Test R posterior canal/ L anterior canals as pt demonstrates down beating torsional nystagmus with left dix hall pike 10/29, track cervical HEP, possible extend if dizziness symptoms persist   Lacinda Fass, PT, DPT  3:02 PM, 01/22/24

## 2024-01-23 DIAGNOSIS — L573 Poikiloderma of Civatte: Secondary | ICD-10-CM | POA: Diagnosis not present

## 2024-01-23 DIAGNOSIS — L814 Other melanin hyperpigmentation: Secondary | ICD-10-CM | POA: Diagnosis not present

## 2024-01-23 DIAGNOSIS — L439 Lichen planus, unspecified: Secondary | ICD-10-CM | POA: Diagnosis not present

## 2024-01-24 ENCOUNTER — Encounter (HOSPITAL_COMMUNITY)

## 2024-01-24 ENCOUNTER — Ambulatory Visit
Admission: RE | Admit: 2024-01-24 | Discharge: 2024-01-24 | Disposition: A | Discharge status: Home / Self Care | Source: Ambulatory Visit | Attending: Family Medicine | Admitting: Family Medicine

## 2024-01-24 DIAGNOSIS — R928 Other abnormal and inconclusive findings on diagnostic imaging of breast: Secondary | ICD-10-CM

## 2024-01-24 DIAGNOSIS — N6311 Unspecified lump in the right breast, upper outer quadrant: Secondary | ICD-10-CM | POA: Diagnosis not present

## 2024-01-30 ENCOUNTER — Ambulatory Visit (HOSPITAL_COMMUNITY)

## 2024-01-30 ENCOUNTER — Encounter (HOSPITAL_COMMUNITY): Payer: Self-pay

## 2024-01-30 DIAGNOSIS — R42 Dizziness and giddiness: Secondary | ICD-10-CM | POA: Diagnosis not present

## 2024-01-30 NOTE — Therapy (Signed)
 OUTPATIENT PHYSICAL THERAPY VESTIBULAR TREATMENT    Progress Note Reporting Period 12/27/23 to 01/30/24  See note below for Objective Data and Assessment of Progress/Goals.     Patient Name: Theresa Reese MRN: [id number] DOB:May 17, 1950, 73 y.o., female Today's Date: 01/30/2024  END OF SESSION:  PT End of Session - 01/30/24 1457     Visit Number 4    Number of Visits 4    Date for Recertification  01/24/24    Authorization Type [employer]    Authorization Time Period 8 visits from 10/08-01/06    Authorization - Visit Number 4    Authorization - Number of Visits 8    Progress Note Due on Visit 4    PT Start Time 1458    PT Stop Time 1542    PT Time Calculation (min) 44 min    Activity Tolerance Patient tolerated treatment well    Behavior During Therapy WFL for tasks assessed/performed             Past Medical History:  Diagnosis Date   GERD (gastroesophageal reflux disease)    Hypercholesteremia    Hypertension    Hypothyroid    Insomnia    Left knee DJD    Left shoulder pain    Lymphocytic colitis    Past Surgical History:  Procedure Laterality Date   BACK SURGERY     CESAREAN SECTION  1987   CESAREAN SECTION  1990   COLONOSCOPY     EYE SURGERY Bilateral    cataract   JOINT REPLACEMENT Left    KNEE ARTHROSCOPY W/ MENISCECTOMY Left 2014   Dr Duwayne   low back surgery  1997   Dr Mora   low back surgery  1994   Dr Drury   RETINAL DETACHMENT SURGERY Right 2013   SHOULDER ARTHROSCOPY WITH ROTATOR CUFF REPAIR Left 05/22/2019   Procedure: LEFT SHOULDER ARTHROSCOPY WITH ROTATOR CUFF REPAIR;  Surgeon: Cristy Bonner DASEN, MD;  Location: [facility];  Service: Orthopedics;  Laterality: Left;   SHOULDER ARTHROSCOPY WITH SUBACROMIAL DECOMPRESSION AND BICEP TENDON REPAIR Left 05/22/2019   Procedure: LEFT SHOULDER ARTHROSCOPY  DEBRIDEMENT WITH SUBACROMIAL DECOMPRESSION AND BICEP TENODESIS;  Surgeon: Cristy Bonner DASEN, MD;  Location: [facility];  Service: Orthopedics;  Laterality: Left;   TOTAL KNEE ARTHROPLASTY Left 09/09/2013   Procedure: LEFT TOTAL KNEE ARTHROPLASTY;  Surgeon: Lamar DELENA Millman, MD;  Location: [facility];  Service: Orthopedics;  Laterality: Left;   Patient Active Problem List   Diagnosis Date Noted   DJD (degenerative joint disease) of knee 09/09/2013   Insomnia    Depression with anxiety    Hypothyroid    Lymphocytic colitis    Left knee DJD     PCP: Lenora Harvey, MD REFERRING PROVIDER: Tobie Eldora NOVAK, MD  REFERRING DIAG: H81.10 (ICD-10-CM) - Benign paroxysmal positional vertigo, unspecified laterality H90.3 (ICD-10-CM) - Sensorineural hearing loss, bilateral H69.91 (ICD-10-CM) - Dysfunction of right eustachian tube  THERAPY DIAG:  Dizziness and giddiness  ONSET DATE: Jan 2025  Rationale for Evaluation and Treatment: Rehabilitation  SUBJECTIVE:   SUBJECTIVE STATEMENT: Patient reports she is doing better, about 40% improved. Pt states she has dizzy spells but they are getting less intense, last bout was this morning getting up and laying back down. Pt states she is able to look down and up better since starting therapy. Towel exercise causing increased neck soreness and is too nervous to perform Epley at home. Pt is scared to not do  it right and mess something up.   Eval: Started January 2025; was at a store and went to look up at something on a shelf; dizziness came on and was really bad; then went away but comes and goes.  Intermittent. Since July has an episode every day; getting up at out of bed and getting into bed or laying on couch; looking down and back seem to set it off as well.  Cardiologist ruled out carotid artery involvement Pt accompanied by: self  PERTINENT HISTORY: osteopenia, depression, anxiety, HTN, afib, former smoker  PAIN:  Are you having pain? No  PRECAUTIONS: None  WEIGHT BEARING RESTRICTIONS: No  FALLS: Has patient fallen in last 6 months? No  PLOF:  Independent  PATIENT GOALS: to not be dizzy  OBJECTIVE:  Note: Objective measures were completed at Evaluation unless otherwise noted.  DIAGNOSTIC FINDINGS:   COGNITION: Overall cognitive status: Within functional limits for tasks assessed   SENSATION: WFL  EDEMA:  None noted  MUSCLE TONE:  Appears wfl  POSTURE:  rounded shoulders and forward head  Cervical ROM:  grossly wfl; guarded movement with eyes closed; noted slight decrease in left cervical rotation versus right  Active AROM (deg) eval  Flexion   Extension   Right lateral flexion   Left lateral flexion   Right rotation   Left rotation   (Blank rows = not tested)  STRENGTH: not tested  LOWER EXTREMITY MMT:   MMT Right eval Left eval  Hip flexion    Hip abduction    Hip adduction    Hip internal rotation    Hip external rotation    Knee flexion    Knee extension    Ankle dorsiflexion    Ankle plantarflexion    Ankle inversion    Ankle eversion    (Blank rows = not tested)  BED MOBILITY:  Sit to supine CGA Supine to sit CGA Rolling to Right CGA    FUNCTIONAL TESTS:  5 times sit to stand: next visit  PATIENT SURVEYS:  DHI: THE DIZZINESS HANDICAP INVENTORY (DHI)  P1. Does looking up increase your problem? 4 = Yes  E2. Because of your problem, do you feel frustrated? 4 = Yes  F3. Because of your problem, do you restrict your travel for business or recreation?  4 = Yes  P4. Does walking down the aisle of a supermarket increase your problems?  0 = No  F5. Because of your problem, do you have difficulty getting into or out of bed?  4 = Yes  F6. Does your problem significantly restrict your participation in social activities, such as going out to dinner, going to the movies, dancing, or going to parties? 4 = Yes  F7. Because of your problem, do you have difficulty reading?  2 = Sometimes  P8. Does performing more ambitious activities such as sports, dancing, household chores (sweeping or  putting dishes away) increase your problems?  4 = Yes  E9. Because of your problem, are you afraid to leave your home without having without having someone accompany you?  2 = Sometimes  E10. Because of your problem have you been embarrassed in front of others?  0 = No  P11. Do quick movements of your head increase your problem?  4 = Yes  F12. Because of your problem, do you avoid heights?  4 = Yes  P13. Does turning over in bed increase your problem?  4 = Yes  F14. Because of your problem, is it difficult  for you to do strenuous homework or yard work? 4 = Yes  E15. Because of your problem, are you afraid people may think you are intoxicated? 0 = No  F16. Because of your problem, is it difficult for you to go for a walk by yourself?  0 = No  P17. Does walking down a sidewalk increase your problem?  0 = No  E18.Because of your problem, is it difficult for you to concentrate 4 = Yes  F19. Because of your problem, is it difficult for you to walk around your house in the dark? 4 = Yes  E20. Because of your problem, are you afraid to stay home alone?  0 = No  E21. Because of your problem, do you feel handicapped? 0 = No  E22. Has the problem placed stress on your relationships with members of your family or friends? 4 = Yes  E23. Because of your problem, are you depressed?  2 = Sometimes  F24. Does your problem interfere with your job or household responsibilities?  4 = Yes  P25. Does bending over increase your problem?  4 = Yes  TOTAL 66    Patient ID: Date Completed:01/30/2024 3:11:42 PM P1. Does looking up increase your problem? Sometimes (2 points) E2. Because of your problem, do you feel frustrated? Always (4 points) F3. Because of your problem, do you restrict your travel for business or pleasure? Sometimes (2 points) P4. Does walking down the aisle of a supermarket increase your problem? No (0 points) F5. Because of your problem, do you have difficulty getting into or out of  bed? Always (4 points) F6. Does your problem significantly restrict your participation in social activities..? Sometimes (2 points) F7. Because of your problem, do you have difficulty reading? Sometimes (2 points) F8. Does performing more ambitious activities.SABRAincrease your problem? No (0 points) E9. ..are you afraid to leave your home without having someone accompany you? No (0 points) E10. Because of your problem, have you been embarrassed in front of others? No (0 points) P11. Do quick movements of your head increase your problem? Sometimes (2 points) F12. Because of your problem, do you avoid heights? Always (4 points) P13. Does turning over in bed increase your problem? Always (4 points) F14. ..is it difficult for you to do strenuous housework or yard work? No (0 points) E15. ..are you afraid people may think that you are intoxicated? No (0 points) The tools listed on this website do not substitute for the informed opinion of a licensed physician or other health care provider. All scores should be re-checked. Please see our full Terms of Use. Dizziness Handicap Inventory [facility]) Summary Patient ID: Date Completed:01/30/2024 3:11:42 PM F16. Because of your problem, is it difficult for you to go for a walk by yourself? No (0 points) P17. Does walking down a sidewalk increase your problem? No (0 points) E18. Because of your problem, is it difficult for you to concentrate? Sometimes (2 points) F19. ..is it difficult for you to walk around your house in the dark? Always (4 points) E20. Because of your problem, are you afraid to stay home alone? No (0 points) E21. Because of your problem, do you feel handicapped? No (0 points) E22. Has your problem placed stress on your relationships..? No (0 points) E23. Because of your problem, are you depressed? Sometimes (2 points) F24. Does your problem interfere with your job or household responsibilities? No (0  points) P25. Does bending over increase your problem? Sometimes (2 points)  Pertinent Negative Pertinent Positive Pertinent Positive Dizziness Handicap Inventory [facility]): 36 points or 36 percent. (P: 10/24 E: 8/36 F: 18/40)  VESTIBULAR ASSESSMENT:  GENERAL OBSERVATION: glasses   SYMPTOM BEHAVIOR:  Subjective history: see above  Non-Vestibular symptoms: none  Type of dizziness: Spinning/Vertigo and Unsteady with head/body turns  Frequency: daily  Duration: short 10 to 20 sec  Aggravating factors: Induced by position change: lying supine, rolling to the right, rolling to the left, supine to sit, and sit to stand and Induced by motion: looking up at the ceiling and turning head quickly  Relieving factors: closing eyes and rest  Progression of symptoms: worse  OCULOMOTOR EXAM:  Ocular Alignment: normal  Ocular ROM: No Limitations  Spontaneous Nystagmus: absent  Gaze-Induced Nystagmus: absent  Smooth Pursuits: normal  Saccades: normal except for vertical corrective saccades noted  Convergence/Divergence: not tested cm   Cover-cross-cover test: not tested    VESTIBULAR - OCULAR REFLEX:   Not tested POSITIONAL TESTING: Left Dix-Hallpike: upbeating, left nystagmus  MOTION SENSITIVITY:  Motion Sensitivity Quotient Intensity: 0 = none, 1 = Lightheaded, 2 = Mild, 3 = Moderate, 4 = Severe, 5 = Vomiting  Intensity  1. Sitting to supine   2. Supine to L side   3. Supine to R side   4. Supine to sitting   5. L Hallpike-Dix   6. Up from L    7. R Hallpike-Dix   8. Up from R    9. Sitting, head tipped to L knee   10. Head up from L knee   11. Sitting, head tipped to R knee   12. Head up from R knee   13. Sitting head turns x5   14.Sitting head nods x5   15. In stance, 180 turn to L    16. In stance, 180 turn to R     OTHOSTATICS: not done  FUNCTIONAL GAIT: 5 times sit to stand: next visit                                                                                                                              TREATMENT DATE:  01/30/2024  Pt educated on cervical HEP, to be completed once dizziness is subsided tomorrow. Pt educated on possible anterior canal involvement. Canalith repositioning: -Epley maneuver of R posterior canal x2, up beating torsional nystagmus in 1st position on both reps -Second Epley trial for right posterior canal, up beating torsional nystagmus present in position 1 very mild compared to first trial, decreased pronouncement of nystagmus and dizziness symptoms                                    01/22/24 EXERCISE LOG  Exercise Repetitions and Resistance Comments  Canalith repositioning  X1 for left posterior canal   Patient education  See below  Blank cell = exercise not performed today   01/17/2024  Pt educated on cervical HEP, to be completed once dizziness is subsided tomorrow. Pt educated on possible anterior canal involvement. Canalith repositioning: -Epley maneuver of Left posterior canal, up beating torsional nystagmus in 1st and 2nd position, worse on 1st position -Second Epley trial for left posterior canal, down beating torsional nystagmus present in position 1, decreased pronouncement of nystagmus    PATIENT EDUCATION: Education details: Patient educated on activity modifications, positioning, symptom presentation Person educated: Patient Education method: Explanation, Demonstration, and Handouts Education comprehension: verbalized understanding, returned demonstration, verbal cues required, and tactile cues required  HOME EXERCISE PROGRAM: Access Code: [email or web address] URL: [email or web address] Date: 01/22/2024 Prepared by: Lacinda Fass  Exercises - Self-Epley Maneuver Left Ear  - 1 x daily - 7 x weekly - 1-2 sets  Access Code: [email or web address] URL: [email or web address] Date: 01/17/2024 Prepared by: Lang Ada  Exercises - Seated Cervical Rotation AROM  - 1 x daily - 7 x  weekly - 3 sets - 10 reps - Standing Cervical Sidebending AROM  - 1 x daily - 7 x weekly - 3 sets - 10 reps - Seated Cervical Retraction  - 1 x daily - 7 x weekly - 3 sets - 10 reps - 3 hold - Seated Upper Trapezius Stretch  - 1 x daily - 7 x weekly - 1 sets - 3 reps - 20 hold  GOALS: Goals reviewed with patient? No  SHORT TERM GOALS: Target date: 01/10/2024  Patient will report 50% improvement overall  Baseline: 40% reported 01/30/24 Goal status: IN PROGRESS  2.  Patient will have a full week without any vertigo with going to bed or getting out of bed Baseline: went 3 days without dizziness but came back the next day Goal status: IN PROGRESS    LONG TERM GOALS: Target date: 01/24/2024  Patient will report 80% improvement overall  Baseline:  Goal status: IN PROGRESS  2.  Patient will improve DHI score to 10 or less to demonstrate decreased dizziness with functional activities Baseline: 66/100 Goal status: IN PROGRESS   ASSESSMENT:  CLINICAL IMPRESSION: Patient continues to demonstrate position induced dizziness, decreased functional mobility, decreased gait quality and speed. Patient demonstrates positive for R dix hall pike testing, was treated for R posterior canal involvement this date. 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 Pt has met none of the initial eval goals, but has shown progress towards all of them. Pt does demonstrate decreased nystagmus and dizziness intensity on second trial. Patient would continue to benefit from skilled physical therapy for decreased dizziness for for improved quality of life, improved functional mobility and independence with dizziness symptoms and continued progress towards therapy goals.    Eval: Patient is a 73 y.o. female who was seen today for physical therapy evaluation and treatment for H81.10 (ICD-10-CM) - Benign paroxysmal positional vertigo, unspecified  laterality H90.3 (ICD-10-CM) - Sensorineural hearing loss, bilateral H69.91 (ICD-10-CM) - Dysfunction of right eustachian tube. Patient demonstrates increased vestibular symptoms with provocative testing which is negatively impacting patient ability to perform ADLs and functional mobility tasks. Patient will benefit from skilled physical therapy services to address these deficits to improve level of function with ADLs, functional mobility tasks, and reduce risk for falls.    OBJECTIVE IMPAIRMENTS: decreased knowledge of condition and dizziness.   ACTIVITY LIMITATIONS: bending and caring for others  PARTICIPATION LIMITATIONS: interpersonal relationship, driving, and shopping  PERSONAL FACTORS: 1-2 comorbidities:  afib, HTN are also affecting patient's functional outcome.   REHAB POTENTIAL: Good  CLINICAL DECISION MAKING: Evolving/moderate complexity  EVALUATION COMPLEXITY: Moderate   PLAN:  PT FREQUENCY: 1x/week  PT DURATION: 4 weeks  PLANNED INTERVENTIONS: 97164- PT Re-evaluation, 97110-Therapeutic exercises, 97530- Therapeutic activity, 97112- Neuromuscular re-education, 97535- Self Care, [id number]- Manual therapy, [id number]- Gait training, [phone]- Orthotic Fit/training, [phone]- Canalith repositioning, [id number]- Aquatic Therapy, [phone]- Splinting, [phone]- Wound care (first 20 sq cm), 97598- Wound care (each additional 20 sq cm)Patient/Family education, Balance training, Stair training, Taping, Dry Needling, Joint mobilization, Joint manipulation, Spinal manipulation, Spinal mobilization, Scar mobilization, and DME instructions.   PLAN FOR NEXT SESSION: Test R posterior canal, track cervical HEP, extend 4 weeks, 1 visit per week   Lang Ada, PT, DPT [facility] Office: [phone] 2:59 PM, 01/30/24

## 2024-02-18 ENCOUNTER — Other Ambulatory Visit: Payer: Self-pay | Admitting: Cardiology

## 2024-02-18 DIAGNOSIS — I480 Paroxysmal atrial fibrillation: Secondary | ICD-10-CM

## 2024-02-19 NOTE — Telephone Encounter (Signed)
 Prescription refill request for Xarelto  received.  Indication: AF Last office visit: 9/25 Weight: 69.9 kg Age:73 Scr:0.87 CrCl:65

## 2024-02-21 DIAGNOSIS — J4530 Mild persistent asthma, uncomplicated: Secondary | ICD-10-CM | POA: Diagnosis not present

## 2024-02-21 DIAGNOSIS — J209 Acute bronchitis, unspecified: Secondary | ICD-10-CM | POA: Diagnosis not present

## 2024-02-21 DIAGNOSIS — J300 Vasomotor rhinitis: Secondary | ICD-10-CM | POA: Diagnosis not present

## 2024-02-23 ENCOUNTER — Ambulatory Visit (HOSPITAL_COMMUNITY): Attending: Otolaryngology

## 2024-02-23 DIAGNOSIS — R42 Dizziness and giddiness: Secondary | ICD-10-CM | POA: Insufficient documentation

## 2024-02-23 DIAGNOSIS — M62830 Muscle spasm of back: Secondary | ICD-10-CM | POA: Diagnosis present

## 2024-02-23 DIAGNOSIS — M546 Pain in thoracic spine: Secondary | ICD-10-CM | POA: Diagnosis present

## 2024-02-23 DIAGNOSIS — R293 Abnormal posture: Secondary | ICD-10-CM | POA: Insufficient documentation

## 2024-02-23 NOTE — Therapy (Signed)
 OUTPATIENT PHYSICAL THERAPY VESTIBULAR TREATMENT       Patient Name: Theresa Reese MRN: [id number] DOB:29-Aug-1950, 73 y.o., female Today's Date: 02/23/2024  END OF SESSION:  PT End of Session - 02/23/24 1504     Visit Number 5    Number of Visits 8    Date for Recertification  01/24/24    Authorization Type [employer]    Authorization Time Period 8 visits from 10/08-01/06    Authorization - Visit Number 5    Authorization - Number of Visits 8    Progress Note Due on Visit 4    PT Start Time 1503    PT Stop Time 1543    PT Time Calculation (min) 40 min    Activity Tolerance Patient tolerated treatment well    Behavior During Therapy WFL for tasks assessed/performed             Past Medical History:  Diagnosis Date   GERD (gastroesophageal reflux disease)    Hypercholesteremia    Hypertension    Hypothyroid    Insomnia    Left knee DJD    Left shoulder pain    Lymphocytic colitis    Past Surgical History:  Procedure Laterality Date   BACK SURGERY     CESAREAN SECTION  1987   CESAREAN SECTION  1990   COLONOSCOPY     EYE SURGERY Bilateral    cataract   JOINT REPLACEMENT Left    KNEE ARTHROSCOPY W/ MENISCECTOMY Left 2014   Dr Duwayne   low back surgery  1997   Dr Mora   low back surgery  1994   Dr Drury   RETINAL DETACHMENT SURGERY Right 2013   SHOULDER ARTHROSCOPY WITH ROTATOR CUFF REPAIR Left 05/22/2019   Procedure: LEFT SHOULDER ARTHROSCOPY WITH ROTATOR CUFF REPAIR;  Surgeon: Cristy Bonner DASEN, MD;  Location: [facility];  Service: Orthopedics;  Laterality: Left;   SHOULDER ARTHROSCOPY WITH SUBACROMIAL DECOMPRESSION AND BICEP TENDON REPAIR Left 05/22/2019   Procedure: LEFT SHOULDER ARTHROSCOPY  DEBRIDEMENT WITH SUBACROMIAL DECOMPRESSION AND BICEP TENODESIS;  Surgeon: Cristy Bonner DASEN, MD;  Location: [facility];  Service: Orthopedics;  Laterality: Left;   TOTAL KNEE ARTHROPLASTY Left 09/09/2013   Procedure: LEFT TOTAL KNEE  ARTHROPLASTY;  Surgeon: Lamar DELENA Millman, MD;  Location: [facility];  Service: Orthopedics;  Laterality: Left;   Patient Active Problem List   Diagnosis Date Noted   DJD (degenerative joint disease) of knee 09/09/2013   Insomnia    Depression with anxiety    Hypothyroid    Lymphocytic colitis    Left knee DJD     PCP: Lenora Harvey, MD REFERRING PROVIDER: Tobie Eldora NOVAK, MD  REFERRING DIAG: H81.10 (ICD-10-CM) - Benign paroxysmal positional vertigo, unspecified laterality H90.3 (ICD-10-CM) - Sensorineural hearing loss, bilateral H69.91 (ICD-10-CM) - Dysfunction of right eustachian tube  THERAPY DIAG:  Dizziness and giddiness  Abnormal posture  Pain in thoracic spine  Muscle spasm of back  ONSET DATE: Jan 2025  Rationale for Evaluation and Treatment: Rehabilitation  SUBJECTIVE:   SUBJECTIVE STATEMENT: Was really bad after last treatment; reports that she did the self Epley x 3 at home and the next day woke up without dizziness.  Reports she does not want to do the maneuver today as she is afraid it will set her off.   Eval: Started January 2025; was at a store and went to look up at something on a shelf; dizziness came on and was really bad; then  went away but comes and goes.  Intermittent. Since July has an episode every day; getting up at out of bed and getting into bed or laying on couch; looking down and back seem to set it off as well.  Cardiologist ruled out carotid artery involvement Pt accompanied by: self  PERTINENT HISTORY: osteopenia, depression, anxiety, HTN, afib, former smoker  PAIN:  Are you having pain? No  PRECAUTIONS: None  WEIGHT BEARING RESTRICTIONS: No  FALLS: Has patient fallen in last 6 months? No  PLOF: Independent  PATIENT GOALS: to not be dizzy  OBJECTIVE:  Note: Objective measures were completed at Evaluation unless otherwise noted.  DIAGNOSTIC FINDINGS:   COGNITION: Overall cognitive status: Within functional limits for tasks  assessed   SENSATION: WFL  EDEMA:  None noted  MUSCLE TONE:  Appears wfl  POSTURE:  rounded shoulders and forward head  Cervical ROM:  grossly wfl; guarded movement with eyes closed; noted slight decrease in left cervical rotation versus right  Active AROM (deg) eval  Flexion   Extension   Right lateral flexion   Left lateral flexion   Right rotation   Left rotation   (Blank rows = not tested)  STRENGTH: not tested  LOWER EXTREMITY MMT:   MMT Right eval Left eval  Hip flexion    Hip abduction    Hip adduction    Hip internal rotation    Hip external rotation    Knee flexion    Knee extension    Ankle dorsiflexion    Ankle plantarflexion    Ankle inversion    Ankle eversion    (Blank rows = not tested)  BED MOBILITY:  Sit to supine CGA Supine to sit CGA Rolling to Right CGA    FUNCTIONAL TESTS:  5 times sit to stand: next visit  PATIENT SURVEYS:  DHI: THE DIZZINESS HANDICAP INVENTORY (DHI)  P1. Does looking up increase your problem? 4 = Yes  E2. Because of your problem, do you feel frustrated? 4 = Yes  F3. Because of your problem, do you restrict your travel for business or recreation?  4 = Yes  P4. Does walking down the aisle of a supermarket increase your problems?  0 = No  F5. Because of your problem, do you have difficulty getting into or out of bed?  4 = Yes  F6. Does your problem significantly restrict your participation in social activities, such as going out to dinner, going to the movies, dancing, or going to parties? 4 = Yes  F7. Because of your problem, do you have difficulty reading?  2 = Sometimes  P8. Does performing more ambitious activities such as sports, dancing, household chores (sweeping or putting dishes away) increase your problems?  4 = Yes  E9. Because of your problem, are you afraid to leave your home without having without having someone accompany you?  2 = Sometimes  E10. Because of your problem have you been embarrassed in  front of others?  0 = No  P11. Do quick movements of your head increase your problem?  4 = Yes  F12. Because of your problem, do you avoid heights?  4 = Yes  P13. Does turning over in bed increase your problem?  4 = Yes  F14. Because of your problem, is it difficult for you to do strenuous homework or yard work? 4 = Yes  E15. Because of your problem, are you afraid people may think you are intoxicated? 0 = No  F16. Because of  your problem, is it difficult for you to go for a walk by yourself?  0 = No  P17. Does walking down a sidewalk increase your problem?  0 = No  E18.Because of your problem, is it difficult for you to concentrate 4 = Yes  F19. Because of your problem, is it difficult for you to walk around your house in the dark? 4 = Yes  E20. Because of your problem, are you afraid to stay home alone?  0 = No  E21. Because of your problem, do you feel handicapped? 0 = No  E22. Has the problem placed stress on your relationships with members of your family or friends? 4 = Yes  E23. Because of your problem, are you depressed?  2 = Sometimes  F24. Does your problem interfere with your job or household responsibilities?  4 = Yes  P25. Does bending over increase your problem?  4 = Yes  TOTAL 66    Patient ID: Date Completed:01/30/2024 3:11:42 PM P1. Does looking up increase your problem? Sometimes (2 points) E2. Because of your problem, do you feel frustrated? Always (4 points) F3. Because of your problem, do you restrict your travel for business or pleasure? Sometimes (2 points) P4. Does walking down the aisle of a supermarket increase your problem? No (0 points) F5. Because of your problem, do you have difficulty getting into or out of bed? Always (4 points) F6. Does your problem significantly restrict your participation in social activities..? Sometimes (2 points) F7. Because of your problem, do you have difficulty reading? Sometimes (2 points) F8. Does performing more  ambitious activities.SABRAincrease your problem? No (0 points) E9. ..are you afraid to leave your home without having someone accompany you? No (0 points) E10. Because of your problem, have you been embarrassed in front of others? No (0 points) P11. Do quick movements of your head increase your problem? Sometimes (2 points) F12. Because of your problem, do you avoid heights? Always (4 points) P13. Does turning over in bed increase your problem? Always (4 points) F14. ..is it difficult for you to do strenuous housework or yard work? No (0 points) E15. ..are you afraid people may think that you are intoxicated? No (0 points) The tools listed on this website do not substitute for the informed opinion of a licensed physician or other health care provider. All scores should be re-checked. Please see our full Terms of Use. Dizziness Handicap Inventory [facility]) Summary Patient ID: Date Completed:01/30/2024 3:11:42 PM F16. Because of your problem, is it difficult for you to go for a walk by yourself? No (0 points) P17. Does walking down a sidewalk increase your problem? No (0 points) E18. Because of your problem, is it difficult for you to concentrate? Sometimes (2 points) F19. ..is it difficult for you to walk around your house in the dark? Always (4 points) E20. Because of your problem, are you afraid to stay home alone? No (0 points) E21. Because of your problem, do you feel handicapped? No (0 points) E22. Has your problem placed stress on your relationships..? No (0 points) E23. Because of your problem, are you depressed? Sometimes (2 points) F24. Does your problem interfere with your job or household responsibilities? No (0 points) P25. Does bending over increase your problem? Sometimes (2 points) Pertinent Negative Pertinent Positive Pertinent Positive Dizziness Handicap Inventory [facility]): 36 points or 36 percent. (P: 10/24 E: 8/36 F: 18/40)  VESTIBULAR  ASSESSMENT:  GENERAL OBSERVATION: glasses   SYMPTOM BEHAVIOR:  Subjective  history: see above  Non-Vestibular symptoms: none  Type of dizziness: Spinning/Vertigo and Unsteady with head/body turns  Frequency: daily  Duration: short 10 to 20 sec  Aggravating factors: Induced by position change: lying supine, rolling to the right, rolling to the left, supine to sit, and sit to stand and Induced by motion: looking up at the ceiling and turning head quickly  Relieving factors: closing eyes and rest  Progression of symptoms: worse  OCULOMOTOR EXAM:  Ocular Alignment: normal  Ocular ROM: No Limitations  Spontaneous Nystagmus: absent  Gaze-Induced Nystagmus: absent  Smooth Pursuits: normal  Saccades: normal except for vertical corrective saccades noted  Convergence/Divergence: not tested cm   Cover-cross-cover test: not tested    VESTIBULAR - OCULAR REFLEX:   Not tested POSITIONAL TESTING: Left Dix-Hallpike: upbeating, left nystagmus  MOTION SENSITIVITY:  Motion Sensitivity Quotient Intensity: 0 = none, 1 = Lightheaded, 2 = Mild, 3 = Moderate, 4 = Severe, 5 = Vomiting  Intensity  1. Sitting to supine   2. Supine to L side   3. Supine to R side   4. Supine to sitting   5. L Hallpike-Dix   6. Up from L    7. R Hallpike-Dix   8. Up from R    9. Sitting, head tipped to L knee   10. Head up from L knee   11. Sitting, head tipped to R knee   12. Head up from R knee   13. Sitting head turns x5   14.Sitting head nods x5   15. In stance, 180 turn to L    16. In stance, 180 turn to R     OTHOSTATICS: not done  FUNCTIONAL GAIT: 5 times sit to stand: next visit                                                                                                                             TREATMENT DATE:  02/23/24 Discussion of self Epley Moist heat to cervical spine x 5' to decrease cervical tightness while discussing self care, symptoms, HEP STM to bilateral upper traps and  paraspinals x 10' to decrease cervical tightness Seated: Upper trap stretch 3 x 20 Cervical retractions x 10 Cervical rotation x 3 each way with hold   01/30/2024  Pt educated on cervical HEP, to be completed once dizziness is subsided tomorrow. Pt educated on possible anterior canal involvement. Canalith repositioning: -Epley maneuver of R posterior canal x2, up beating torsional nystagmus in 1st position on both reps -Second Epley trial for right posterior canal, up beating torsional nystagmus present in position 1 very mild compared to first trial, decreased pronouncement of nystagmus and dizziness symptoms                                    01/22/24 EXERCISE LOG  Exercise Repetitions and Resistance Comments  Canalith repositioning  X1  for left posterior canal   Patient education  See below                Blank cell = exercise not performed today   01/17/2024  Pt educated on cervical HEP, to be completed once dizziness is subsided tomorrow. Pt educated on possible anterior canal involvement. Canalith repositioning: -Epley maneuver of Left posterior canal, up beating torsional nystagmus in 1st and 2nd position, worse on 1st position -Second Epley trial for left posterior canal, down beating torsional nystagmus present in position 1, decreased pronouncement of nystagmus    PATIENT EDUCATION: Education details: Patient educated on activity modifications, positioning, symptom presentation Person educated: Patient Education method: Explanation, Demonstration, and Handouts Education comprehension: verbalized understanding, returned demonstration, verbal cues required, and tactile cues required  HOME EXERCISE PROGRAM: Access Code: [email or web address] URL: [email or web address] Date: 01/22/2024 Prepared by: Lacinda Fass  Exercises - Self-Epley Maneuver Left Ear  - 1 x daily - 7 x weekly - 1-2 sets  Access Code: [email or web address] URL: [email or web address] Date:  01/17/2024 Prepared by: Lang Ada  Exercises - Seated Cervical Rotation AROM  - 1 x daily - 7 x weekly - 3 sets - 10 reps - Standing Cervical Sidebending AROM  - 1 x daily - 7 x weekly - 3 sets - 10 reps - Seated Cervical Retraction  - 1 x daily - 7 x weekly - 3 sets - 10 reps - 3 hold - Seated Upper Trapezius Stretch  - 1 x daily - 7 x weekly - 1 sets - 3 reps - 20 hold  GOALS: Goals reviewed with patient? No  SHORT TERM GOALS: Target date: 01/10/2024  Patient will report 50% improvement overall  Baseline: 40% reported 01/30/24 Goal status: IN PROGRESS  2.  Patient will have a full week without any vertigo with going to bed or getting out of bed Baseline: went 3 days without dizziness but came back the next day Goal status: IN PROGRESS    LONG TERM GOALS: Target date: 01/24/2024  Patient will report 80% improvement overall  Baseline:  Goal status: IN PROGRESS  2.  Patient will improve DHI score to 10 or less to demonstrate decreased dizziness with functional activities Baseline: 66/100 Goal status: IN PROGRESS   ASSESSMENT:  CLINICAL IMPRESSION: Patient defers positional testing today as she is afraid it will aggravate her symptoms as she is feeling so much better overall.  Focus today on cervical mobility as she does demonstrate forward head and slight guarding with cervical extension.  Patient with tight upper traps but improved tissue extensibility at the end of treatment.  Patient would continue to benefit from skilled physical therapy for decreased dizziness for for improved quality of life, improved functional mobility and independence with dizziness symptoms and continued progress towards therapy goals.    Eval: Patient is a 73 y.o. female who was seen today for physical therapy evaluation and treatment for H81.10 (ICD-10-CM) - Benign paroxysmal positional vertigo, unspecified laterality H90.3 (ICD-10-CM) - Sensorineural hearing loss, bilateral H69.91 (ICD-10-CM)  - Dysfunction of right eustachian tube. Patient demonstrates increased vestibular symptoms with provocative testing which is negatively impacting patient ability to perform ADLs and functional mobility tasks. Patient will benefit from skilled physical therapy services to address these deficits to improve level of function with ADLs, functional mobility tasks, and reduce risk for falls.    OBJECTIVE IMPAIRMENTS: decreased knowledge of condition and dizziness.   ACTIVITY LIMITATIONS: bending and caring for others  PARTICIPATION LIMITATIONS: interpersonal relationship, driving, and shopping  PERSONAL FACTORS: 1-2 comorbidities: afib, HTN are also affecting patient's functional outcome.   REHAB POTENTIAL: Good  CLINICAL DECISION MAKING: Evolving/moderate complexity  EVALUATION COMPLEXITY: Moderate   PLAN:  PT FREQUENCY: 1x/week  PT DURATION: 4 weeks  PLANNED INTERVENTIONS: 97164- PT Re-evaluation, 97110-Therapeutic exercises, 97530- Therapeutic activity, 97112- Neuromuscular re-education, 97535- Self Care, [id number]- Manual therapy, [id number]- Gait training, [phone]- Orthotic Fit/training, [phone]- Canalith repositioning, [id number]- Aquatic Therapy, [phone]- Splinting, [phone]- Wound care (first 20 sq cm), 97598- Wound care (each additional 20 sq cm)Patient/Family education, Balance training, Stair training, Taping, Dry Needling, Joint mobilization, Joint manipulation, Spinal manipulation, Spinal mobilization, Scar mobilization, and DME instructions.   PLAN FOR NEXT SESSION: Test R posterior canal, track cervical HEP, extend 4 weeks, 1 visit per week  3:48 PM, 02/23/24 Thien Berka Small Chyanne Kohut MPT [facility] [place] [phone] [phone]

## 2024-03-01 ENCOUNTER — Ambulatory Visit (HOSPITAL_COMMUNITY)

## 2024-03-04 ENCOUNTER — Ambulatory Visit (HOSPITAL_COMMUNITY)

## 2024-03-07 ENCOUNTER — Ambulatory Visit (INDEPENDENT_AMBULATORY_CARE_PROVIDER_SITE_OTHER): Admitting: Otolaryngology

## 2024-03-08 ENCOUNTER — Ambulatory Visit (HOSPITAL_COMMUNITY)

## 2024-03-08 ENCOUNTER — Encounter (HOSPITAL_COMMUNITY): Payer: Self-pay

## 2024-03-08 DIAGNOSIS — R42 Dizziness and giddiness: Secondary | ICD-10-CM | POA: Diagnosis not present

## 2024-03-08 NOTE — Therapy (Signed)
 " OUTPATIENT PHYSICAL THERAPY VESTIBULAR TREATMENT       Patient Name: Theresa Reese MRN: [id number] DOB:02/26/1951, 73 y.o., female Today's Date: 03/08/2024  END OF SESSION:  PT End of Session - 03/08/24 1549     Visit Number 6    Number of Visits 8    Date for Recertification  01/24/24    Authorization Type [employer]    Authorization Time Period 8 visits from 10/08-01/06    Authorization - Visit Number 6    Authorization - Number of Visits 8    Progress Note Due on Visit 4    PT Start Time 1549    PT Stop Time 1620    PT Time Calculation (min) 31 min    Activity Tolerance Patient tolerated treatment well    Behavior During Therapy Anxious              Past Medical History:  Diagnosis Date   GERD (gastroesophageal reflux disease)    Hypercholesteremia    Hypertension    Hypothyroid    Insomnia    Left knee DJD    Left shoulder pain    Lymphocytic colitis    Past Surgical History:  Procedure Laterality Date   BACK SURGERY     CESAREAN SECTION  1987   CESAREAN SECTION  1990   COLONOSCOPY     EYE SURGERY Bilateral    cataract   JOINT REPLACEMENT Left    KNEE ARTHROSCOPY W/ MENISCECTOMY Left 2014   Dr Duwayne   low back surgery  1997   Dr Mora   low back surgery  1994   Dr Drury   RETINAL DETACHMENT SURGERY Right 2013   SHOULDER ARTHROSCOPY WITH ROTATOR CUFF REPAIR Left 05/22/2019   Procedure: LEFT SHOULDER ARTHROSCOPY WITH ROTATOR CUFF REPAIR;  Surgeon: Cristy Bonner DASEN, MD;  Location: [facility];  Service: Orthopedics;  Laterality: Left;   SHOULDER ARTHROSCOPY WITH SUBACROMIAL DECOMPRESSION AND BICEP TENDON REPAIR Left 05/22/2019   Procedure: LEFT SHOULDER ARTHROSCOPY  DEBRIDEMENT WITH SUBACROMIAL DECOMPRESSION AND BICEP TENODESIS;  Surgeon: Cristy Bonner DASEN, MD;  Location: [facility];  Service: Orthopedics;  Laterality: Left;   TOTAL KNEE ARTHROPLASTY Left 09/09/2013   Procedure: LEFT TOTAL KNEE ARTHROPLASTY;  Surgeon:  Lamar DELENA Millman, MD;  Location: [facility];  Service: Orthopedics;  Laterality: Left;   Patient Active Problem List   Diagnosis Date Noted   DJD (degenerative joint disease) of knee 09/09/2013   Insomnia    Depression with anxiety    Hypothyroid    Lymphocytic colitis    Left knee DJD     PCP: Lenora Harvey, MD REFERRING PROVIDER: Tobie Eldora NOVAK, MD  REFERRING DIAG: H81.10 (ICD-10-CM) - Benign paroxysmal positional vertigo, unspecified laterality H90.3 (ICD-10-CM) - Sensorineural hearing loss, bilateral H69.91 (ICD-10-CM) - Dysfunction of right eustachian tube  THERAPY DIAG:  Dizziness and giddiness  ONSET DATE: Jan 2025  Rationale for Evaluation and Treatment: Rehabilitation  SUBJECTIVE:   SUBJECTIVE STATEMENT: Patient reports that she feels dizzy today (4/10). She felt bad for about 3 days after her last appointment. However, she felt good for about 2 weeks after those 3 days.    Eval: Started January 2025; was at a store and went to look up at something on a shelf; dizziness came on and was really bad; then went away but comes and goes.  Intermittent. Since July has an episode every day; getting up at out of bed and getting into bed or laying  on couch; looking down and back seem to set it off as well.  Cardiologist ruled out carotid artery involvement Pt accompanied by: self  PERTINENT HISTORY: osteopenia, depression, anxiety, HTN, afib, former smoker  PAIN:  Are you having pain? No  PRECAUTIONS: None  WEIGHT BEARING RESTRICTIONS: No  FALLS: Has patient fallen in last 6 months? No  PLOF: Independent  PATIENT GOALS: to not be dizzy  OBJECTIVE:  Note: Objective measures were completed at Evaluation unless otherwise noted.  DIAGNOSTIC FINDINGS:   COGNITION: Overall cognitive status: Within functional limits for tasks assessed   SENSATION: WFL  EDEMA:  None noted  MUSCLE TONE:  Appears wfl  POSTURE:  rounded shoulders and forward head  Cervical ROM:   grossly wfl; guarded movement with eyes closed; noted slight decrease in left cervical rotation versus right  Active AROM (deg) eval  Flexion   Extension   Right lateral flexion   Left lateral flexion   Right rotation   Left rotation   (Blank rows = not tested)  STRENGTH: not tested  LOWER EXTREMITY MMT:   MMT Right eval Left eval  Hip flexion    Hip abduction    Hip adduction    Hip internal rotation    Hip external rotation    Knee flexion    Knee extension    Ankle dorsiflexion    Ankle plantarflexion    Ankle inversion    Ankle eversion    (Blank rows = not tested)  BED MOBILITY:  Sit to supine CGA Supine to sit CGA Rolling to Right CGA    FUNCTIONAL TESTS:  5 times sit to stand: next visit  PATIENT SURVEYS:  DHI: THE DIZZINESS HANDICAP INVENTORY (DHI)  P1. Does looking up increase your problem? 4 = Yes  E2. Because of your problem, do you feel frustrated? 4 = Yes  F3. Because of your problem, do you restrict your travel for business or recreation?  4 = Yes  P4. Does walking down the aisle of a supermarket increase your problems?  0 = No  F5. Because of your problem, do you have difficulty getting into or out of bed?  4 = Yes  F6. Does your problem significantly restrict your participation in social activities, such as going out to dinner, going to the movies, dancing, or going to parties? 4 = Yes  F7. Because of your problem, do you have difficulty reading?  2 = Sometimes  P8. Does performing more ambitious activities such as sports, dancing, household chores (sweeping or putting dishes away) increase your problems?  4 = Yes  E9. Because of your problem, are you afraid to leave your home without having without having someone accompany you?  2 = Sometimes  E10. Because of your problem have you been embarrassed in front of others?  0 = No  P11. Do quick movements of your head increase your problem?  4 = Yes  F12. Because of your problem, do you avoid  heights?  4 = Yes  P13. Does turning over in bed increase your problem?  4 = Yes  F14. Because of your problem, is it difficult for you to do strenuous homework or yard work? 4 = Yes  E15. Because of your problem, are you afraid people may think you are intoxicated? 0 = No  F16. Because of your problem, is it difficult for you to go for a walk by yourself?  0 = No  P17. Does walking down a sidewalk increase your  problem?  0 = No  E18.Because of your problem, is it difficult for you to concentrate 4 = Yes  F19. Because of your problem, is it difficult for you to walk around your house in the dark? 4 = Yes  E20. Because of your problem, are you afraid to stay home alone?  0 = No  E21. Because of your problem, do you feel handicapped? 0 = No  E22. Has the problem placed stress on your relationships with members of your family or friends? 4 = Yes  E23. Because of your problem, are you depressed?  2 = Sometimes  F24. Does your problem interfere with your job or household responsibilities?  4 = Yes  P25. Does bending over increase your problem?  4 = Yes  TOTAL 66    Patient ID: Date Completed:01/30/2024 3:11:42 PM P1. Does looking up increase your problem? Sometimes (2 points) E2. Because of your problem, do you feel frustrated? Always (4 points) F3. Because of your problem, do you restrict your travel for business or pleasure? Sometimes (2 points) P4. Does walking down the aisle of a supermarket increase your problem? No (0 points) F5. Because of your problem, do you have difficulty getting into or out of bed? Always (4 points) F6. Does your problem significantly restrict your participation in social activities..? Sometimes (2 points) F7. Because of your problem, do you have difficulty reading? Sometimes (2 points) F8. Does performing more ambitious activities.SABRAincrease your problem? No (0 points) E9. ..are you afraid to leave your home without having someone accompany  you? No (0 points) E10. Because of your problem, have you been embarrassed in front of others? No (0 points) P11. Do quick movements of your head increase your problem? Sometimes (2 points) F12. Because of your problem, do you avoid heights? Always (4 points) P13. Does turning over in bed increase your problem? Always (4 points) F14. ..is it difficult for you to do strenuous housework or yard work? No (0 points) E15. ..are you afraid people may think that you are intoxicated? No (0 points) The tools listed on this website do not substitute for the informed opinion of a licensed physician or other health care provider. All scores should be re-checked. Please see our full Terms of Use. Dizziness Handicap Inventory [facility]) Summary Patient ID: Date Completed:01/30/2024 3:11:42 PM F16. Because of your problem, is it difficult for you to go for a walk by yourself? No (0 points) P17. Does walking down a sidewalk increase your problem? No (0 points) E18. Because of your problem, is it difficult for you to concentrate? Sometimes (2 points) F19. ..is it difficult for you to walk around your house in the dark? Always (4 points) E20. Because of your problem, are you afraid to stay home alone? No (0 points) E21. Because of your problem, do you feel handicapped? No (0 points) E22. Has your problem placed stress on your relationships..? No (0 points) E23. Because of your problem, are you depressed? Sometimes (2 points) F24. Does your problem interfere with your job or household responsibilities? No (0 points) P25. Does bending over increase your problem? Sometimes (2 points) Pertinent Negative Pertinent Positive Pertinent Positive Dizziness Handicap Inventory [facility]): 36 points or 36 percent. (P: 10/24 E: 8/36 F: 18/40)  VESTIBULAR ASSESSMENT:  GENERAL OBSERVATION: glasses   SYMPTOM BEHAVIOR:  Subjective history: see above  Non-Vestibular symptoms: none  Type of  dizziness: Spinning/Vertigo and Unsteady with head/body turns  Frequency: daily  Duration: short 10 to 20 sec  Aggravating factors: Induced by position change: lying supine, rolling to the right, rolling to the left, supine to sit, and sit to stand and Induced by motion: looking up at the ceiling and turning head quickly  Relieving factors: closing eyes and rest  Progression of symptoms: worse  OCULOMOTOR EXAM:  Ocular Alignment: normal  Ocular ROM: No Limitations  Spontaneous Nystagmus: absent  Gaze-Induced Nystagmus: absent  Smooth Pursuits: normal  Saccades: normal except for vertical corrective saccades noted  Convergence/Divergence: not tested cm   Cover-cross-cover test: not tested    VESTIBULAR - OCULAR REFLEX:   Not tested POSITIONAL TESTING: Left Dix-Hallpike: upbeating, left nystagmus  MOTION SENSITIVITY:  Motion Sensitivity Quotient Intensity: 0 = none, 1 = Lightheaded, 2 = Mild, 3 = Moderate, 4 = Severe, 5 = Vomiting  Intensity  1. Sitting to supine   2. Supine to L side   3. Supine to R side   4. Supine to sitting   5. L Hallpike-Dix   6. Up from L    7. R Hallpike-Dix   8. Up from R    9. Sitting, head tipped to L knee   10. Head up from L knee   11. Sitting, head tipped to R knee   12. Head up from R knee   13. Sitting head turns x5   14.Sitting head nods x5   15. In stance, 180 turn to L    16. In stance, 180 turn to R     OTHOSTATICS: not done  FUNCTIONAL GAIT: 5 times sit to stand: next visit                                                                                                                             TREATMENT DATE:                                    03/08/24 EXERCISE LOG  Exercise Repetitions and Resistance Comments  R Epley  X1  For right posterior canal   Patient education  See below                 Blank cell = exercise not performed today   02/23/24 Discussion of self Epley Moist heat to cervical spine x 5' to  decrease cervical tightness while discussing self care, symptoms, HEP STM to bilateral upper traps and paraspinals x 10' to decrease cervical tightness Seated: Upper trap stretch 3 x 20 Cervical retractions x 10 Cervical rotation x 3 each way with hold   01/30/2024  Pt educated on cervical HEP, to be completed once dizziness is subsided tomorrow. Pt educated on possible anterior canal involvement. Canalith repositioning: -Epley maneuver of R posterior canal x2, up beating torsional nystagmus in 1st position on both reps -Second Epley trial for right posterior canal, up beating torsional nystagmus present in position 1 very mild compared to first  trial, decreased pronouncement of nystagmus and dizziness symptoms                                    01/22/24 EXERCISE LOG  Exercise Repetitions and Resistance Comments  Canalith repositioning  X1 for left posterior canal   Patient education  See below                Blank cell = exercise not performed today   PATIENT EDUCATION: Education details: Patient educated on activity modifications, positioning, symptom presentation, anatomy, BPPV treatment, and communicating with her referring physician,  Person educated: Patient Education method: Explanation, Demonstration, and Handouts Education comprehension: verbalized understanding, returned demonstration, verbal cues required, and tactile cues required  HOME EXERCISE PROGRAM: Access Code: [email or web address] URL: [email or web address] Date: 01/22/2024 Prepared by: Lacinda Fass  Exercises - Self-Epley Maneuver Left Ear  - 1 x daily - 7 x weekly - 1-2 sets  Access Code: [email or web address] URL: [email or web address] Date: 01/17/2024 Prepared by: Lang Ada  Exercises - Seated Cervical Rotation AROM  - 1 x daily - 7 x weekly - 3 sets - 10 reps - Standing Cervical Sidebending AROM  - 1 x daily - 7 x weekly - 3 sets - 10 reps - Seated Cervical Retraction  - 1 x daily - 7 x  weekly - 3 sets - 10 reps - 3 hold - Seated Upper Trapezius Stretch  - 1 x daily - 7 x weekly - 1 sets - 3 reps - 20 hold  GOALS: Goals reviewed with patient? No  SHORT TERM GOALS: Target date: 01/10/2024  Patient will report 50% improvement overall  Baseline: 40% reported 01/30/24 Goal status: IN PROGRESS  2.  Patient will have a full week without any vertigo with going to bed or getting out of bed Baseline: went 3 days without dizziness but came back the next day Goal status: IN PROGRESS    LONG TERM GOALS: Target date: 01/24/2024  Patient will report 80% improvement overall  Baseline:  Goal status: IN PROGRESS  2.  Patient will improve DHI score to 10 or less to demonstrate decreased dizziness with functional activities Baseline: 66/100 Goal status: IN PROGRESS   ASSESSMENT:  CLINICAL IMPRESSION: Patient presented to treatment reporting increased dizziness today compared to recent days. The Epley maneuver was utilized to address the symptoms. However, this was unable to significantly reduce her familiar symptoms. She noted increased dizziness with today's maneuver compared to previous treatments. She became visibly distraught and frustrated due to her lack of progress and her continued symptoms. She was significantly educated on her symptoms and the treatment of BPPV. She reported understanding.  She declined additional canalith repositioning today.  She reported that she still felt dizzy upon the conclusion of treatment. Patient continues to require skilled physical therapy to address her remaining impairments to return to her prior level of function.    Eval: Patient is a 73 y.o. female who was seen today for physical therapy evaluation and treatment for H81.10 (ICD-10-CM) - Benign paroxysmal positional vertigo, unspecified laterality H90.3 (ICD-10-CM) - Sensorineural hearing loss, bilateral H69.91 (ICD-10-CM) - Dysfunction of right eustachian tube. Patient demonstrates  increased vestibular symptoms with provocative testing which is negatively impacting patient ability to perform ADLs and functional mobility tasks. Patient will benefit from skilled physical therapy services to address these deficits to improve level of function with ADLs, functional mobility tasks, and reduce risk for falls.  OBJECTIVE IMPAIRMENTS: decreased knowledge of condition and dizziness.   ACTIVITY LIMITATIONS: bending and caring for others  PARTICIPATION LIMITATIONS: interpersonal relationship, driving, and shopping  PERSONAL FACTORS: 1-2 comorbidities: afib, HTN are also affecting patient's functional outcome.   REHAB POTENTIAL: Good  CLINICAL DECISION MAKING: Evolving/moderate complexity  EVALUATION COMPLEXITY: Moderate   PLAN:  PT FREQUENCY: 1x/week  PT DURATION: 4 weeks  PLANNED INTERVENTIONS: 97164- PT Re-evaluation, 97110-Therapeutic exercises, 97530- Therapeutic activity, 97112- Neuromuscular re-education, 97535- Self Care, [id number]- Manual therapy, [id number]- Gait training, [phone]- Orthotic Fit/training, [phone]- Canalith repositioning, [id number]- Aquatic Therapy, [phone]- Splinting, [phone]- Wound care (first 20 sq cm), 97598- Wound care (each additional 20 sq cm)Patient/Family education, Balance training, Stair training, Taping, Dry Needling, Joint mobilization, Joint manipulation, Spinal manipulation, Spinal mobilization, Scar mobilization, and DME instructions.   PLAN FOR NEXT SESSION: Test R posterior canal, track cervical HEP, extend 4 weeks, 1 visit per week  Lacinda Fass, PT, DPT  5:18 PM, 03/08/2024  "

## 2024-03-11 ENCOUNTER — Ambulatory Visit (HOSPITAL_COMMUNITY)

## 2024-03-18 ENCOUNTER — Ambulatory Visit (HOSPITAL_COMMUNITY)

## 2024-03-18 DIAGNOSIS — R42 Dizziness and giddiness: Secondary | ICD-10-CM | POA: Diagnosis not present

## 2024-03-18 NOTE — Therapy (Signed)
 " OUTPATIENT PHYSICAL THERAPY VESTIBULAR TREATMENT       Patient Name: Theresa Reese MRN: [id number] DOB:[birth year], 73 y.o., female Today's Date: 03/18/2024  END OF SESSION:  PT End of Session - 03/18/24 1456     Visit Number 7    Number of Visits 8    Date for Recertification  01/24/24    Authorization Type [employer]    Authorization Time Period 8 visits from 10/08-01/06    Authorization - Visit Number 7    Authorization - Number of Visits 8    Progress Note Due on Visit 4    PT Start Time 1456    PT Stop Time 1540    PT Time Calculation (min) 44 min    Activity Tolerance Patient tolerated treatment well    Behavior During Therapy Anxious              Past Medical History:  Diagnosis Date   GERD (gastroesophageal reflux disease)    Hypercholesteremia    Hypertension    Hypothyroid    Insomnia    Left knee DJD    Left shoulder pain    Lymphocytic colitis    Past Surgical History:  Procedure Laterality Date   BACK SURGERY     CESAREAN SECTION  1987   CESAREAN SECTION  1990   COLONOSCOPY     EYE SURGERY Bilateral    cataract   JOINT REPLACEMENT Left    KNEE ARTHROSCOPY W/ MENISCECTOMY Left 2014   Dr Duwayne   low back surgery  1997   Dr Mora   low back surgery  1994   Dr Drury   RETINAL DETACHMENT SURGERY Right 2013   SHOULDER ARTHROSCOPY WITH ROTATOR CUFF REPAIR Left 05/22/2019   Procedure: LEFT SHOULDER ARTHROSCOPY WITH ROTATOR CUFF REPAIR;  Surgeon: Cristy Bonner DASEN, MD;  Location: [facility];  Service: Orthopedics;  Laterality: Left;   SHOULDER ARTHROSCOPY WITH SUBACROMIAL DECOMPRESSION AND BICEP TENDON REPAIR Left 05/22/2019   Procedure: LEFT SHOULDER ARTHROSCOPY  DEBRIDEMENT WITH SUBACROMIAL DECOMPRESSION AND BICEP TENODESIS;  Surgeon: Cristy Bonner DASEN, MD;  Location: [facility];  Service: Orthopedics;  Laterality: Left;   TOTAL KNEE ARTHROPLASTY Left 09/09/2013   Procedure: LEFT TOTAL KNEE ARTHROPLASTY;  Surgeon:  Lamar DELENA Millman, MD;  Location: [facility];  Service: Orthopedics;  Laterality: Left;   Patient Active Problem List   Diagnosis Date Noted   DJD (degenerative joint disease) of knee 09/09/2013   Insomnia    Depression with anxiety    Hypothyroid    Lymphocytic colitis    Left knee DJD     PCP: Lenora Harvey, MD REFERRING PROVIDER: Tobie Eldora NOVAK, MD  REFERRING DIAG: H81.10 (ICD-10-CM) - Benign paroxysmal positional vertigo, unspecified laterality H90.3 (ICD-10-CM) - Sensorineural hearing loss, bilateral H69.91 (ICD-10-CM) - Dysfunction of right eustachian tube  THERAPY DIAG:  Dizziness and giddiness  ONSET DATE: Jan 2025  Rationale for Evaluation and Treatment: Rehabilitation  SUBJECTIVE:   SUBJECTIVE STATEMENT: Overall better. Today is a good day   Eval: Started January 2025; was at a store and went to look up at something on a shelf; dizziness came on and was really bad; then went away but comes and goes.  Intermittent. Since July has an episode every day; getting up at out of bed and getting into bed or laying on couch; looking down and back seem to set it off as well.  Cardiologist ruled out carotid artery involvement Pt accompanied by: self  PERTINENT HISTORY: osteopenia, depression, anxiety, HTN, afib, former smoker  PAIN:  Are you having pain? No  PRECAUTIONS: None  WEIGHT BEARING RESTRICTIONS: No  FALLS: Has patient fallen in last 6 months? No  PLOF: Independent  PATIENT GOALS: to not be dizzy  OBJECTIVE:  Note: Objective measures were completed at Evaluation unless otherwise noted.  DIAGNOSTIC FINDINGS:   COGNITION: Overall cognitive status: Within functional limits for tasks assessed   SENSATION: WFL  EDEMA:  None noted  MUSCLE TONE:  Appears wfl  POSTURE:  rounded shoulders and forward head  Cervical ROM:  grossly wfl; guarded movement with eyes closed; noted slight decrease in left cervical rotation versus right  Active AROM (deg) eval   Flexion   Extension   Right lateral flexion   Left lateral flexion   Right rotation   Left rotation   (Blank rows = not tested)  STRENGTH: not tested  LOWER EXTREMITY MMT:   MMT Right eval Left eval  Hip flexion    Hip abduction    Hip adduction    Hip internal rotation    Hip external rotation    Knee flexion    Knee extension    Ankle dorsiflexion    Ankle plantarflexion    Ankle inversion    Ankle eversion    (Blank rows = not tested)  BED MOBILITY:  Sit to supine CGA Supine to sit CGA Rolling to Right CGA    FUNCTIONAL TESTS:  5 times sit to stand: next visit  PATIENT SURVEYS:  DHI: THE DIZZINESS HANDICAP INVENTORY (DHI)  P1. Does looking up increase your problem? 4 = Yes  E2. Because of your problem, do you feel frustrated? 4 = Yes  F3. Because of your problem, do you restrict your travel for business or recreation?  4 = Yes  P4. Does walking down the aisle of a supermarket increase your problems?  0 = No  F5. Because of your problem, do you have difficulty getting into or out of bed?  4 = Yes  F6. Does your problem significantly restrict your participation in social activities, such as going out to dinner, going to the movies, dancing, or going to parties? 4 = Yes  F7. Because of your problem, do you have difficulty reading?  2 = Sometimes  P8. Does performing more ambitious activities such as sports, dancing, household chores (sweeping or putting dishes away) increase your problems?  4 = Yes  E9. Because of your problem, are you afraid to leave your home without having without having someone accompany you?  2 = Sometimes  E10. Because of your problem have you been embarrassed in front of others?  0 = No  P11. Do quick movements of your head increase your problem?  4 = Yes  F12. Because of your problem, do you avoid heights?  4 = Yes  P13. Does turning over in bed increase your problem?  4 = Yes  F14. Because of your problem, is it difficult for you to do  strenuous homework or yard work? 4 = Yes  E15. Because of your problem, are you afraid people may think you are intoxicated? 0 = No  F16. Because of your problem, is it difficult for you to go for a walk by yourself?  0 = No  P17. Does walking down a sidewalk increase your problem?  0 = No  E18.Because of your problem, is it difficult for you to concentrate 4 = Yes  F19. Because of your  problem, is it difficult for you to walk around your house in the dark? 4 = Yes  E20. Because of your problem, are you afraid to stay home alone?  0 = No  E21. Because of your problem, do you feel handicapped? 0 = No  E22. Has the problem placed stress on your relationships with members of your family or friends? 4 = Yes  E23. Because of your problem, are you depressed?  2 = Sometimes  F24. Does your problem interfere with your job or household responsibilities?  4 = Yes  P25. Does bending over increase your problem?  4 = Yes  TOTAL 66    Patient ID: Date Completed:01/30/2024 3:11:42 PM P1. Does looking up increase your problem? Sometimes (2 points) E2. Because of your problem, do you feel frustrated? Always (4 points) F3. Because of your problem, do you restrict your travel for business or pleasure? Sometimes (2 points) P4. Does walking down the aisle of a supermarket increase your problem? No (0 points) F5. Because of your problem, do you have difficulty getting into or out of bed? Always (4 points) F6. Does your problem significantly restrict your participation in social activities..? Sometimes (2 points) F7. Because of your problem, do you have difficulty reading? Sometimes (2 points) F8. Does performing more ambitious activities.SABRAincrease your problem? No (0 points) E9. ..are you afraid to leave your home without having someone accompany you? No (0 points) E10. Because of your problem, have you been embarrassed in front of others? No (0 points) P11. Do quick movements of your head  increase your problem? Sometimes (2 points) F12. Because of your problem, do you avoid heights? Always (4 points) P13. Does turning over in bed increase your problem? Always (4 points) F14. ..is it difficult for you to do strenuous housework or yard work? No (0 points) E15. ..are you afraid people may think that you are intoxicated? No (0 points) The tools listed on this website do not substitute for the informed opinion of a licensed physician or other health care provider. All scores should be re-checked. Please see our full Terms of Use. Dizziness Handicap Inventory [facility]) Summary Patient ID: Date Completed:01/30/2024 3:11:42 PM F16. Because of your problem, is it difficult for you to go for a walk by yourself? No (0 points) P17. Does walking down a sidewalk increase your problem? No (0 points) E18. Because of your problem, is it difficult for you to concentrate? Sometimes (2 points) F19. ..is it difficult for you to walk around your house in the dark? Always (4 points) E20. Because of your problem, are you afraid to stay home alone? No (0 points) E21. Because of your problem, do you feel handicapped? No (0 points) E22. Has your problem placed stress on your relationships..? No (0 points) E23. Because of your problem, are you depressed? Sometimes (2 points) F24. Does your problem interfere with your job or household responsibilities? No (0 points) P25. Does bending over increase your problem? Sometimes (2 points) Pertinent Negative Pertinent Positive Pertinent Positive Dizziness Handicap Inventory [facility]): 36 points or 36 percent. (P: 10/24 E: 8/36 F: 18/40)  VESTIBULAR ASSESSMENT:  GENERAL OBSERVATION: glasses   SYMPTOM BEHAVIOR:  Subjective history: see above  Non-Vestibular symptoms: none  Type of dizziness: Spinning/Vertigo and Unsteady with head/body turns  Frequency: daily  Duration: short 10 to 20 sec  Aggravating factors: Induced by position  change: lying supine, rolling to the right, rolling to the left, supine to sit, and sit to stand and  Induced by motion: looking up at the ceiling and turning head quickly  Relieving factors: closing eyes and rest  Progression of symptoms: worse  OCULOMOTOR EXAM:  Ocular Alignment: normal  Ocular ROM: No Limitations  Spontaneous Nystagmus: absent  Gaze-Induced Nystagmus: absent  Smooth Pursuits: normal  Saccades: normal except for vertical corrective saccades noted  Convergence/Divergence: not tested cm   Cover-cross-cover test: not tested    VESTIBULAR - OCULAR REFLEX:   Not tested POSITIONAL TESTING: Left Dix-Hallpike: upbeating, left nystagmus  MOTION SENSITIVITY:  Motion Sensitivity Quotient Intensity: 0 = none, 1 = Lightheaded, 2 = Mild, 3 = Moderate, 4 = Severe, 5 = Vomiting  Intensity  1. Sitting to supine   2. Supine to L side   3. Supine to R side   4. Supine to sitting   5. L Hallpike-Dix   6. Up from L    7. R Hallpike-Dix   8. Up from R    9. Sitting, head tipped to L knee   10. Head up from L knee   11. Sitting, head tipped to R knee   12. Head up from R knee   13. Sitting head turns x5   14.Sitting head nods x5   15. In stance, 180 turn to L    16. In stance, 180 turn to R     OTHOSTATICS: not done  FUNCTIONAL GAIT: 5 times sit to stand: next visit                                                                                                                             TREATMENT DATE:  03/18/24 Retest right side and left side posterior canal Dix Hallpike; negative bilaterally Test horizontal canals negative SLS x 30 each Discussion on balance; self Epley and next appointment                                    03/08/24 EXERCISE LOG  Exercise Repetitions and Resistance Comments  R Epley  X1  For right posterior canal   Patient education  See below                 Blank cell = exercise not performed today   02/23/24 Discussion of self  Epley Moist heat to cervical spine x 5' to decrease cervical tightness while discussing self care, symptoms, HEP STM to bilateral upper traps and paraspinals x 10' to decrease cervical tightness Seated: Upper trap stretch 3 x 20 Cervical retractions x 10 Cervical rotation x 3 each way with hold   01/30/2024  Pt educated on cervical HEP, to be completed once dizziness is subsided tomorrow. Pt educated on possible anterior canal involvement. Canalith repositioning: -Epley maneuver of R posterior canal x2, up beating torsional nystagmus in 1st position on both reps -Second Epley trial for right posterior canal, up beating torsional nystagmus present in position  1 very mild compared to first trial, decreased pronouncement of nystagmus and dizziness symptoms                                    01/22/24 EXERCISE LOG  Exercise Repetitions and Resistance Comments  Canalith repositioning  X1 for left posterior canal   Patient education  See below                Blank cell = exercise not performed today   PATIENT EDUCATION: Education details: Patient educated on activity modifications, positioning, symptom presentation, anatomy, BPPV treatment, and communicating with her referring physician,  Person educated: Patient Education method: Explanation, Demonstration, and Handouts Education comprehension: verbalized understanding, returned demonstration, verbal cues required, and tactile cues required  HOME EXERCISE PROGRAM: Access Code: [email or web address] URL: [email or web address] Date: 01/22/2024 Prepared by: Lacinda Fass  Exercises - Self-Epley Maneuver Left Ear  - 1 x daily - 7 x weekly - 1-2 sets  Access Code: [email or web address] URL: [email or web address] Date: 01/17/2024 Prepared by: Lang Ada  Exercises - Seated Cervical Rotation AROM  - 1 x daily - 7 x weekly - 3 sets - 10 reps - Standing Cervical Sidebending AROM  - 1 x daily - 7 x weekly - 3 sets - 10 reps -  Seated Cervical Retraction  - 1 x daily - 7 x weekly - 3 sets - 10 reps - 3 hold - Seated Upper Trapezius Stretch  - 1 x daily - 7 x weekly - 1 sets - 3 reps - 20 hold  GOALS: Goals reviewed with patient? No  SHORT TERM GOALS: Target date: 01/10/2024  Patient will report 50% improvement overall  Baseline: 40% reported 01/30/24 Goal status: IN PROGRESS  2.  Patient will have a full week without any vertigo with going to bed or getting out of bed Baseline: went 3 days without dizziness but came back the next day Goal status: IN PROGRESS    LONG TERM GOALS: Target date: 01/24/2024  Patient will report 80% improvement overall  Baseline:  Goal status: IN PROGRESS  2.  Patient will improve DHI score to 10 or less to demonstrate decreased dizziness with functional activities Baseline: 66/100 Goal status: IN PROGRESS   ASSESSMENT:  CLINICAL IMPRESSION: Retest of both right and left posterior canals with Dix Hallpike; negative bilaterally;   testing of horizontal canal negative bilaterally.  SLS x 30 each leg today.  Discussion of contributors to balance, BPPV.  Instructed patient if she was symptom free to cancel next appt in 2 weeks but otherwise will return and we will reassess before her next MD appt.  Reviewed self Epley if symptomatic at home.  Patient continues to require skilled physical therapy to address her remaining impairments to return to her prior level of function.    Eval: Patient is a 73 y.o. female who was seen today for physical therapy evaluation and treatment for H81.10 (ICD-10-CM) - Benign paroxysmal positional vertigo, unspecified laterality H90.3 (ICD-10-CM) - Sensorineural hearing loss, bilateral H69.91 (ICD-10-CM) - Dysfunction of right eustachian tube. Patient demonstrates increased vestibular symptoms with provocative testing which is negatively impacting patient ability to perform ADLs and functional mobility tasks. Patient will benefit from skilled physical  therapy services to address these deficits to improve level of function with ADLs, functional mobility tasks, and reduce risk for falls.    OBJECTIVE IMPAIRMENTS: decreased knowledge of condition and dizziness.   ACTIVITY LIMITATIONS: bending and  caring for others  PARTICIPATION LIMITATIONS: interpersonal relationship, driving, and shopping  PERSONAL FACTORS: 1-2 comorbidities: afib, HTN are also affecting patient's functional outcome.   REHAB POTENTIAL: Good  CLINICAL DECISION MAKING: Evolving/moderate complexity  EVALUATION COMPLEXITY: Moderate   PLAN:  PT FREQUENCY: 1x/week  PT DURATION: 4 weeks  PLANNED INTERVENTIONS: 97164- PT Re-evaluation, 97110-Therapeutic exercises, 97530- Therapeutic activity, 97112- Neuromuscular re-education, 97535- Self Care, 02859- Manual therapy, [id number]- Gait training, [phone]- Orthotic Fit/training, [phone]- Canalith repositioning, [id number]- Aquatic Therapy, [phone]- Splinting, [phone]- Wound care (first 20 sq cm), 97598- Wound care (each additional 20 sq cm)Patient/Family education, Balance training, Stair training, Taping, Dry Needling, Joint mobilization, Joint manipulation, Spinal manipulation, Spinal mobilization, Scar mobilization, and DME instructions.   PLAN FOR NEXT SESSION: Reassess next visit  3:29 PM, 03/18/2024 Mahira Petras Small Davarion Cuffee MPT [facility] [place] [phone] [phone]  "

## 2024-04-02 ENCOUNTER — Ambulatory Visit (HOSPITAL_COMMUNITY)

## 2024-04-04 ENCOUNTER — Ambulatory Visit (INDEPENDENT_AMBULATORY_CARE_PROVIDER_SITE_OTHER): Admitting: Otolaryngology

## 2024-04-09 ENCOUNTER — Ambulatory Visit (HOSPITAL_COMMUNITY)

## 2024-04-09 ENCOUNTER — Encounter (HOSPITAL_COMMUNITY): Payer: Self-pay

## 2024-04-11 ENCOUNTER — Ambulatory Visit (HOSPITAL_BASED_OUTPATIENT_CLINIC_OR_DEPARTMENT_OTHER): Admitting: Pulmonary Disease

## 2024-04-13 IMAGING — MG MM DIGITAL SCREENING BILAT W/ TOMO AND CAD
6 of 10 series · 6 of 30 positions shown · non-contrast
Comparison: Previous exam(s).

CLINICAL DATA: Screening.

EXAM:
DIGITAL SCREENING BILATERAL MAMMOGRAM WITH TOMOSYNTHESIS AND CAD
TECHNIQUE: Bilateral screening digital craniocaudal and mediolateral oblique
mammograms were obtained. Bilateral screening digital breast
tomosynthesis was performed. The images were evaluated with
computer-aided detection.

[R CC synth-2D]
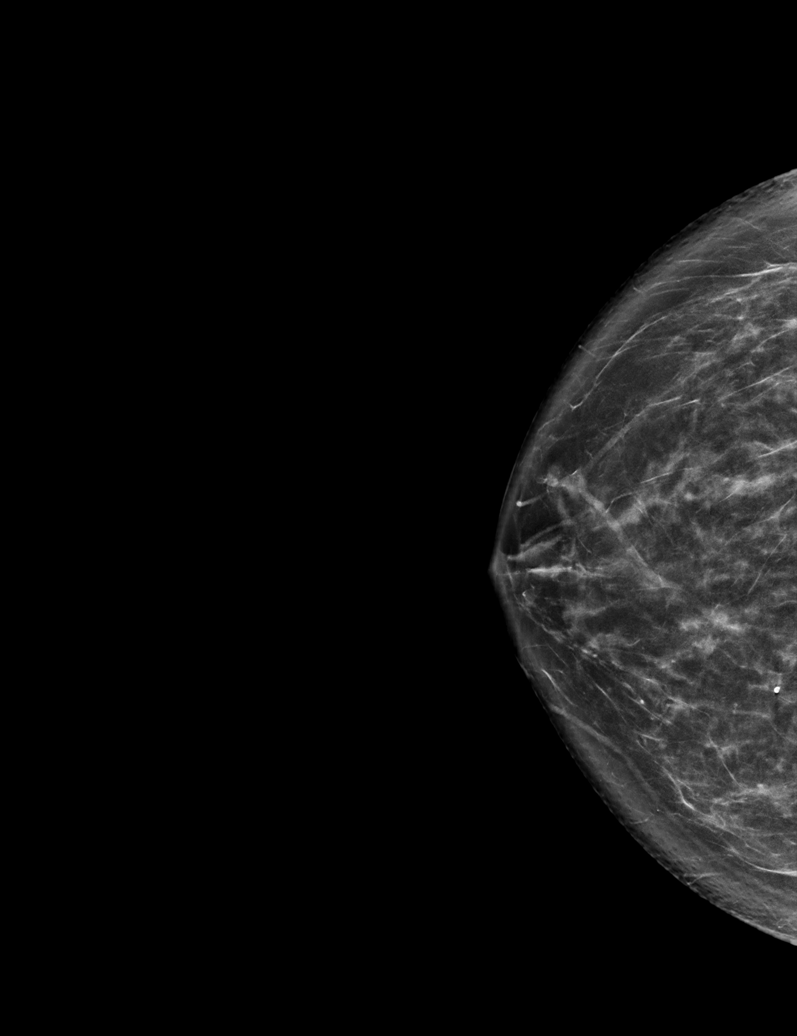

[R XCCL synth-2D]
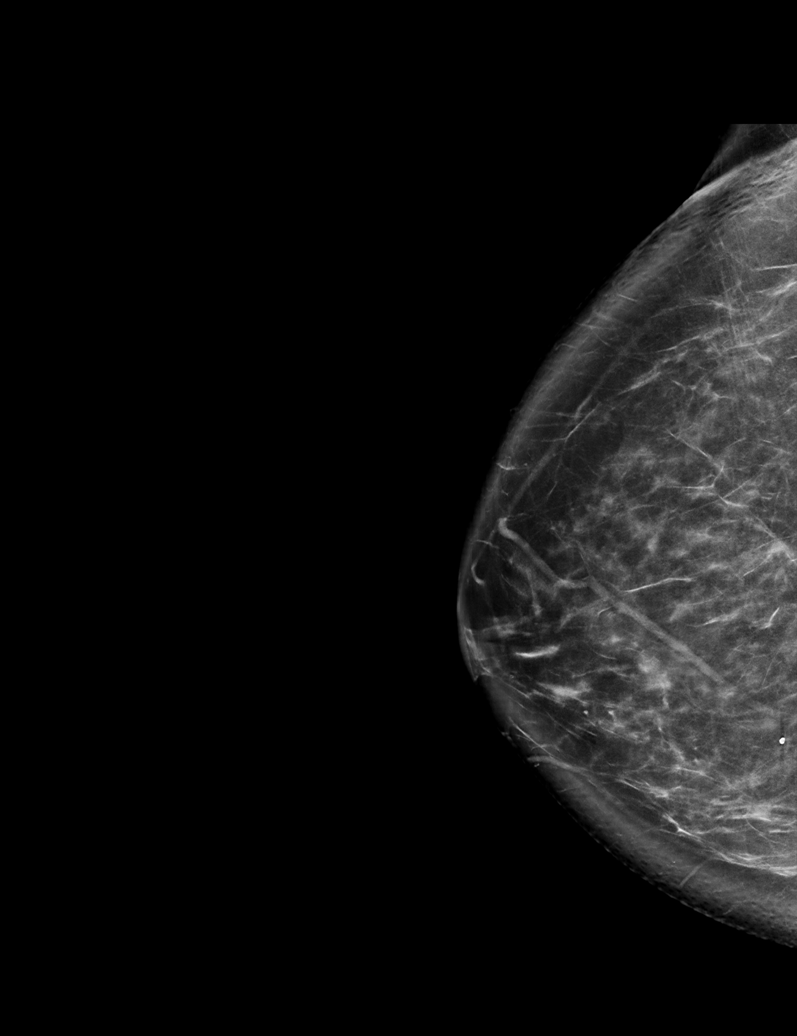

[R MLO synth-2D]
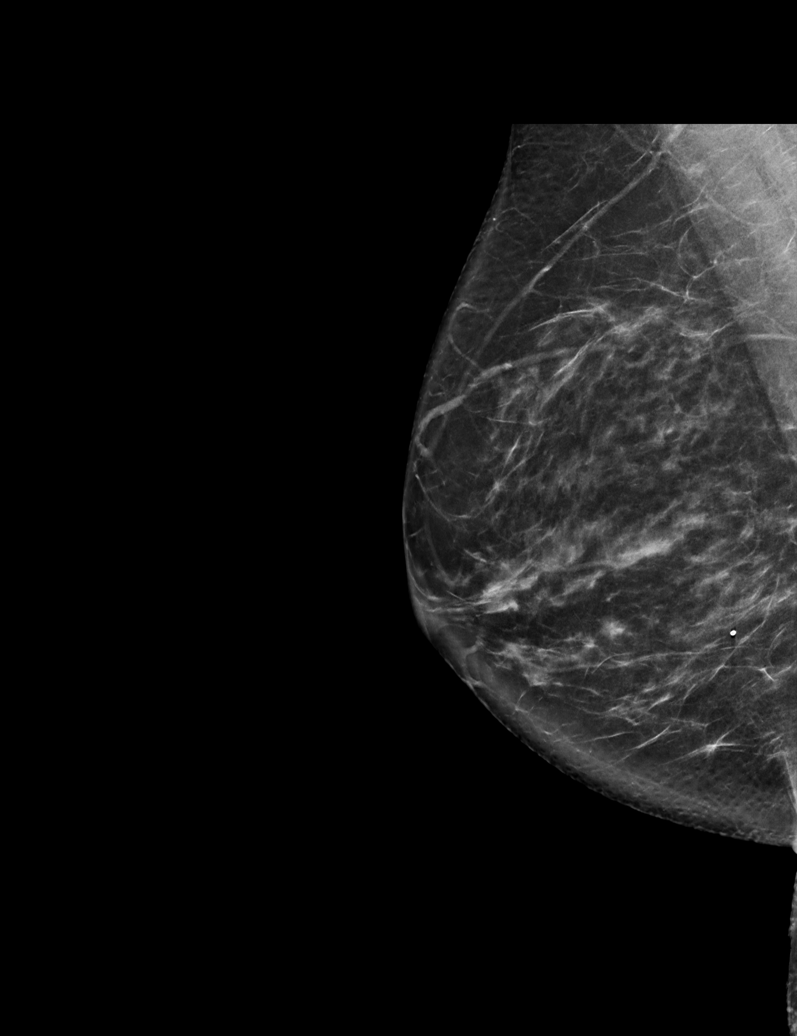

[L CC synth-2D]
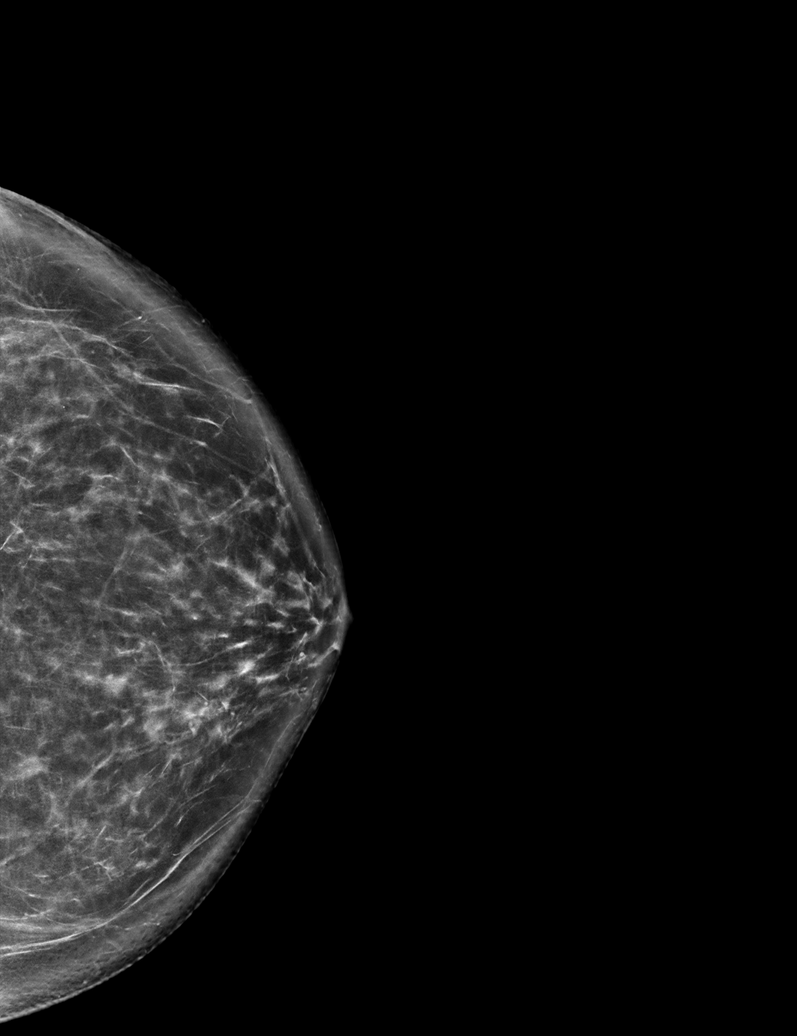

[L MLO synth-2D]
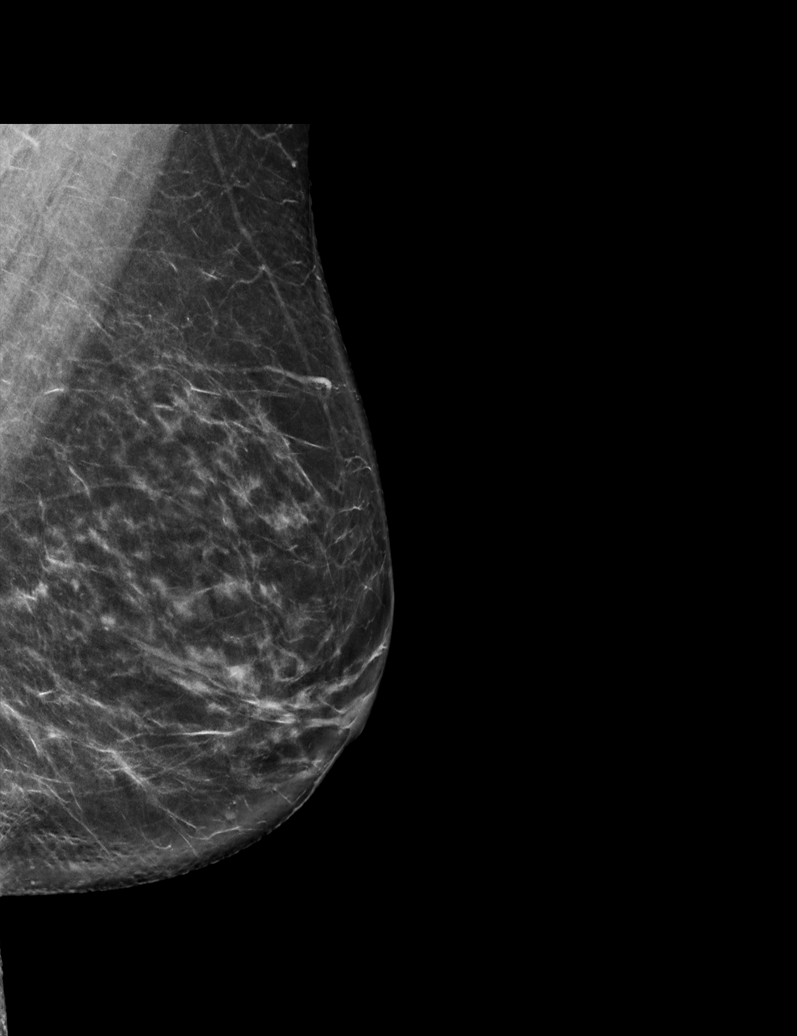

[L MLO tomo · tomo slice 37/74.0]
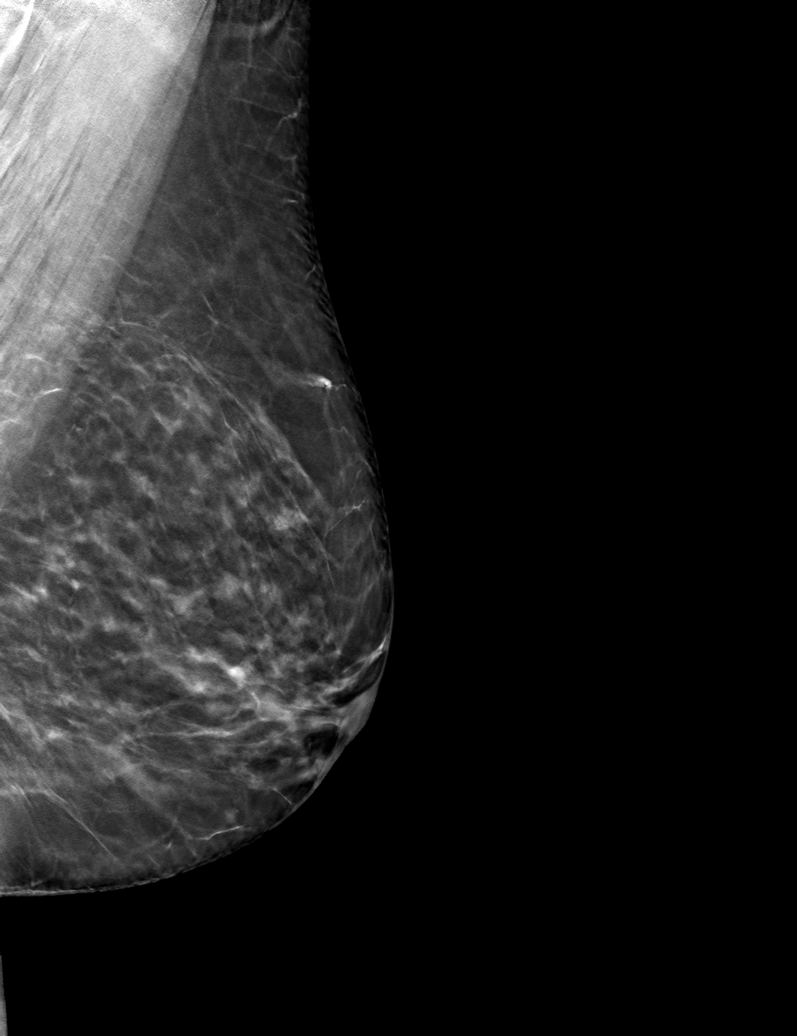

[6 of 30 positions shown; findings below may reference images not displayed]

ACR Breast Density Category b: There are scattered areas of
fibroglandular density.
FINDINGS: There are no findings suspicious for malignancy.
IMPRESSION: No mammographic evidence of malignancy. A result letter of this
screening mammogram will be mailed directly to the patient.

RECOMMENDATION:
Screening mammogram in one year. (Code:51-O-LD2)

BI-RADS CATEGORY  1: Negative.

## 2024-04-22 ENCOUNTER — Ambulatory Visit (HOSPITAL_BASED_OUTPATIENT_CLINIC_OR_DEPARTMENT_OTHER): Admitting: Pulmonary Disease

## 2024-04-26 ENCOUNTER — Other Ambulatory Visit: Payer: Self-pay | Admitting: Cardiology

## 2024-04-26 DIAGNOSIS — I480 Paroxysmal atrial fibrillation: Secondary | ICD-10-CM

## 2024-04-30 ENCOUNTER — Ambulatory Visit (INDEPENDENT_AMBULATORY_CARE_PROVIDER_SITE_OTHER): Admitting: Otolaryngology

## 2024-05-15 ENCOUNTER — Ambulatory Visit (HOSPITAL_BASED_OUTPATIENT_CLINIC_OR_DEPARTMENT_OTHER): Admitting: Pulmonary Disease
